# Patient Record
Sex: Female | Born: 1945 | Race: White | Hispanic: No | Marital: Married | Smoking: Never smoker
Health system: Southern US, Community
[De-identification: ages and names within clinical notes are randomized; demographics above are authoritative.]

## PROBLEM LIST (undated history)

## (undated) DIAGNOSIS — I509 Heart failure, unspecified: Secondary | ICD-10-CM

## (undated) DIAGNOSIS — K222 Esophageal obstruction: Secondary | ICD-10-CM

## (undated) DIAGNOSIS — I5042 Chronic combined systolic (congestive) and diastolic (congestive) heart failure: Secondary | ICD-10-CM

## (undated) DIAGNOSIS — E119 Type 2 diabetes mellitus without complications: Secondary | ICD-10-CM

## (undated) DIAGNOSIS — I10 Essential (primary) hypertension: Secondary | ICD-10-CM

## (undated) DIAGNOSIS — N289 Disorder of kidney and ureter, unspecified: Secondary | ICD-10-CM

## (undated) DIAGNOSIS — K449 Diaphragmatic hernia without obstruction or gangrene: Secondary | ICD-10-CM

## (undated) DIAGNOSIS — F0280 Dementia in other diseases classified elsewhere without behavioral disturbance: Secondary | ICD-10-CM

## (undated) DIAGNOSIS — S82899A Other fracture of unspecified lower leg, initial encounter for closed fracture: Secondary | ICD-10-CM

## (undated) DIAGNOSIS — E669 Obesity, unspecified: Secondary | ICD-10-CM

## (undated) DIAGNOSIS — D649 Anemia, unspecified: Secondary | ICD-10-CM

## (undated) DIAGNOSIS — N184 Chronic kidney disease, stage 4 (severe): Secondary | ICD-10-CM

## (undated) DIAGNOSIS — R195 Other fecal abnormalities: Secondary | ICD-10-CM

## (undated) DIAGNOSIS — I2510 Atherosclerotic heart disease of native coronary artery without angina pectoris: Secondary | ICD-10-CM

## (undated) HISTORY — DX: Type 2 diabetes mellitus without complications: E11.9

## (undated) HISTORY — PX: BILE DUCT STENT PLACEMENT: SHX1227

## (undated) HISTORY — DX: Other fracture of unspecified lower leg, initial encounter for closed fracture: S82.899A

## (undated) HISTORY — DX: Essential (primary) hypertension: I10

## (undated) HISTORY — PX: INCISIONAL HERNIA REPAIR: SHX193

## (undated) HISTORY — DX: Obesity, unspecified: E66.9

---

## 2006-03-08 DIAGNOSIS — S82899A Other fracture of unspecified lower leg, initial encounter for closed fracture: Secondary | ICD-10-CM

## 2006-03-08 HISTORY — DX: Other fracture of unspecified lower leg, initial encounter for closed fracture: S82.899A

## 2008-08-06 ENCOUNTER — Encounter: Admission: RE | Admit: 2008-08-06 | Discharge: 2008-08-06 | Payer: Self-pay | Admitting: Neurology

## 2013-11-07 ENCOUNTER — Encounter: Payer: Self-pay | Admitting: Vascular Surgery

## 2013-11-07 ENCOUNTER — Other Ambulatory Visit: Payer: Self-pay

## 2013-11-07 DIAGNOSIS — L98499 Non-pressure chronic ulcer of skin of other sites with unspecified severity: Secondary | ICD-10-CM

## 2013-11-07 DIAGNOSIS — L97909 Non-pressure chronic ulcer of unspecified part of unspecified lower leg with unspecified severity: Secondary | ICD-10-CM

## 2013-11-07 DIAGNOSIS — I739 Peripheral vascular disease, unspecified: Secondary | ICD-10-CM

## 2013-11-08 ENCOUNTER — Encounter: Payer: Self-pay | Admitting: Vascular Surgery

## 2013-11-08 ENCOUNTER — Ambulatory Visit (HOSPITAL_COMMUNITY)
Admission: RE | Admit: 2013-11-08 | Discharge: 2013-11-08 | Disposition: A | Discharge status: Home / Self Care | Payer: Medicare Other | Source: Ambulatory Visit | Attending: Vascular Surgery | Admitting: Vascular Surgery

## 2013-11-08 ENCOUNTER — Ambulatory Visit (INDEPENDENT_AMBULATORY_CARE_PROVIDER_SITE_OTHER): Payer: Medicare Other | Admitting: Vascular Surgery

## 2013-11-08 ENCOUNTER — Other Ambulatory Visit: Payer: Self-pay

## 2013-11-08 VITALS — Ht 62.0 in | Wt 189.5 lb

## 2013-11-08 DIAGNOSIS — L98499 Non-pressure chronic ulcer of skin of other sites with unspecified severity: Secondary | ICD-10-CM | POA: Insufficient documentation

## 2013-11-08 DIAGNOSIS — I739 Peripheral vascular disease, unspecified: Secondary | ICD-10-CM | POA: Insufficient documentation

## 2013-11-08 DIAGNOSIS — L97909 Non-pressure chronic ulcer of unspecified part of unspecified lower leg with unspecified severity: Secondary | ICD-10-CM

## 2013-11-08 DIAGNOSIS — I7025 Atherosclerosis of native arteries of other extremities with ulceration: Secondary | ICD-10-CM | POA: Insufficient documentation

## 2013-11-08 NOTE — Progress Notes (Signed)
Referred by:  Aleene Davidson, MD [address] [place], [place] [address]  Reason for referral: non-healing L lateral foot ulcer  History of Present Illness  Sabrina Wheeler is a 68 y.o. (June 16, 1945) female who presents with chief complaint: poorly healing spot on left foot.  This patient had a traumatic fracture of her L foot years ago and recently developed an ulceration of the skin over lying a lateral spot on her foot where previously an incision was present.  This spot has scabbed over 3 times but keeps requiring re-debridement by Dr. Nils Pyle.  Patient notes no fever or chills.  She notes no pain in her L foot.  She denies any history of intermittent claudication or rest pain.  Reportedly a recent bone biopsy through this wound was negative.  Atherosclerotic risk factors include: diabetes, HTN, HLD, and distant history of smoking.  Past Medical History  Diagnosis Date  . Diabetes mellitus without complication   . Obesity   . Hypertension   . Broken ankle 2008    left   HLD  Past Surgical History  Procedure Laterality Date  . Incisional hernia repair      History   Social History  . Marital Status: Married    Spouse Name: N/A    Number of Children: N/A  . Years of Education: N/A   Occupational History  . Not on file.   Social History Main Topics  . Smoking status: Former Research scientist (life sciences)  . Smokeless tobacco: Not on file  . Alcohol Use: No  . Drug Use: No  . Sexual Activity: Not on file   Other Topics Concern  . Not on file   Social History Narrative  . No narrative on file    Family History  Problem Relation Age of Onset  . Cancer Mother   . Heart disease Father   . Heart attack Father     Current Outpatient Prescriptions  Medication Sig Dispense Refill  . atenolol (TENORMIN) 25 MG tablet Take 25 mg by mouth daily.      . cloNIDine (CATAPRES) 0.1 MG tablet Take 0.1 mg by mouth 2 (two) times daily.      . enalapril (VASOTEC) 20 MG tablet Take 20 mg by mouth daily.       . fexofenadine (ALLEGRA) 180 MG tablet Take 180 mg by mouth daily.      . fluticasone (VERAMYST) 27.5 MCG/SPRAY nasal spray Place 1 spray into the nose daily.      Marland Kitchen gemfibrozil (LOPID) 600 MG tablet Take 600 mg by mouth 2 (two) times daily before a meal.      . meloxicam (MOBIC) 15 MG tablet Take 15 mg by mouth daily.      Marland Kitchen omeprazole (PRILOSEC) 20 MG capsule Take 20 mg by mouth 2 (two) times daily before a meal.      . pravastatin (PRAVACHOL) 20 MG tablet Take 20 mg by mouth daily.       No current facility-administered medications for this visit.    Allergies  Allergen Reactions  . Macrobid [employer] Macro] Other (See Comments)    Pt states "it paralyzed me"  . Doxycycline Nausea And Vomiting  . Clindamycin/Lincomycin Rash   REVIEW OF SYSTEMS:  (Positives checked otherwise negative)  CARDIOVASCULAR:  []  chest pain, []  chest pressure, []  palpitations, []  shortness of breath when laying flat, []  shortness of breath with exertion,  []  pain in feet when walking, []  pain in feet when laying flat, []   history of blood clot in veins (DVT), []  history of phlebitis, [x]  swelling in legs, []  varicose veins  PULMONARY:  []  productive cough, [x]  asthma, []  wheezing  NEUROLOGIC:  []  weakness in arms or legs, []  numbness in arms or legs, []  difficulty speaking or slurred speech, []  temporary loss of vision in one eye, []  dizziness  HEMATOLOGIC:  []  bleeding problems, []  problems with blood clotting too easily  MUSCULOSKEL:  []  joint pain, []  joint swelling  GASTROINTEST:  []  vomiting blood, []  blood in stool     GENITOURINARY:  []  burning with urination, []  blood in urine  PSYCHIATRIC:  []  history of major depression  INTEGUMENTARY:  []  rashes, []  ulcers  CONSTITUTIONAL:  []  fever, []  chills  For VQI Use Only  PRE-ADM LIVING: Home  AMB STATUS: Ambulatory  CAD Sx: None  PRIOR CHF: None  STRESS TEST: [x]  No, [ ]  Normal, [ ]  + ischemia, [ ]  + MI, [ ]   Both   Physical Examination  Filed Vitals:   11/08/13 1231  Height: 5\' 2"  (1.575 m)  Weight: 189 lb 8 oz (85.957 kg)   Body mass index is 34.65 kg/(m^2).  General: A&O x 3, WDWN  Head: [place]/AT  Ear/Nose/Throat: Hearing grossly intact, nares w/o erythema or drainage, oropharynx w/o Erythema/Exudate, Mallampati score: 3  Eyes: PERRLA, EOMI, Post surg chg to R lens,   Neck: Supple, no nuchal rigidity, no palpable LAD  Pulmonary: Sym exp, good air movt, CTAB, no rales, rhonchi, & wheezing  Cardiac: RRR, Nl S1, S2, no Murmurs, rubs or gallops  Vascular: Vessel Right Left  Radial Palpable Palpable  Brachial Palpable Palpable  Carotid Palpable, without bruit Palpable, without bruit  Aorta Not palpable N/A  Femoral Palpable Palpable  Popliteal Not palpable Not palpable  PT Not Palpable Not Palpable  DP Faintly Palpable Not Palpable   Gastrointestinal: soft, NTND, -G/R, - HSM, - masses, - CVAT B  Musculoskeletal: M/S 5/5 throughout , Extremities without ischemic changes except L foot: clean ulcer on lateral foot with granulation tissue (3 cm x 2 cm x 2 cm), B LDS, B spider veins  Neurologic: CN 2-12 intact , Pain and light touch intact in extremities except slightly decreased on plantar surface (R>L), Motor exam as listed above  Psychiatric: Judgment intact, Mood & affect appropriate for pt's clinical situation  Dermatologic: See M/S exam for extremity exam, no rashes otherwise noted  Lymph : No Cervical, Axillary, or Inguinal lymphadenopathy   Non-Invasive Vascular Imaging  Outside ABI (Date: 10/29/13)  R: 0.98, DP: mono, PT: bi, TBI: 0.49  L: 0.95, DP: mono, PT: mon, TBI: 0.32  LLE arterial duplex (11/08/2013)  Stenosis in L SFA and AK pop  Occluded PTA with flow in peroneal and AT  Outside Studies/Documentation 10 pages of outside documents were reviewed including: outpatient wound clinic chart and outside ABI with physiologics.  Medical Decision  Making  Sabrina Wheeler is a 68 y.o. female who presents with: LLE critical limb ischemia in form of non-healing L lateral foot wound   I discussed with the patient the natural history of critical limb ischemia: 25% require amputation in one year, 50% are able to maintain their limbs in one year, and 25-30% die in one year due to comorbidities.  Given the limb threatening status of this patient, I recommend an aggressive work up including proceeding with an: Aortogram, Bilateral runoff and intervention. I discussed with the patient the nature of angiographic procedures, especially the limited patencies of any  endovascular intervention. The patient is aware of that the risks of an angiographic procedure include but are not limited to: bleeding, infection, access site complications, embolization, rupture of treated vessel, dissection, possible need for emergent surgical intervention, and possible need for surgical procedures to treat the patient's pathology. The patient is aware of the risks and agrees to proceed.  The procedure is scheduled for: 10 SEP 15.  I discussed in depth with the patient the nature of atherosclerosis, and emphasized the importance of maximal medical management including strict control of blood pressure, blood glucose, and lipid levels, antiplatelet agents, obtaining regular exercise, and cessation of smoking.  The patient is aware that without maximal medical management the underlying atherosclerotic disease process will progress, limiting the benefit of any interventions. The patient is currently on a statin:  Pravachol. The patient is currently not on an anti-platelet. The patient will be started on ASA 81 mg PO daily.  Thank you for allowing [place] to participate in this patient's care.  Adele Barthel, MD [facility] Office: [phone] Pager: [phone]  11/08/2013, 1:36 PM

## 2013-11-09 ENCOUNTER — Encounter: Payer: Self-pay | Admitting: Surgery

## 2013-11-13 ENCOUNTER — Encounter (HOSPITAL_COMMUNITY): Payer: Self-pay

## 2013-11-14 MED ORDER — SODIUM CHLORIDE 0.9 % IV SOLN
INTRAVENOUS | Status: DC
  Administered 2013-11-15: 06:00:00 via INTRAVENOUS

## 2013-11-15 ENCOUNTER — Encounter (HOSPITAL_COMMUNITY)
Admission: RE | Disposition: A | Discharge status: Home / Self Care | Payer: Self-pay | Source: Ambulatory Visit | Attending: Vascular Surgery

## 2013-11-15 ENCOUNTER — Ambulatory Visit (HOSPITAL_COMMUNITY)
Admission: RE | Admit: 2013-11-15 | Discharge: 2013-11-15 | Disposition: A | Discharge status: Home / Self Care | Payer: Medicare Other | Source: Ambulatory Visit | Attending: Vascular Surgery | Admitting: Vascular Surgery

## 2013-11-15 DIAGNOSIS — I10 Essential (primary) hypertension: Secondary | ICD-10-CM | POA: Diagnosis not present

## 2013-11-15 DIAGNOSIS — E785 Hyperlipidemia, unspecified: Secondary | ICD-10-CM | POA: Diagnosis not present

## 2013-11-15 DIAGNOSIS — Z5309 Procedure and treatment not carried out because of other contraindication: Secondary | ICD-10-CM | POA: Diagnosis not present

## 2013-11-15 DIAGNOSIS — Z87891 Personal history of nicotine dependence: Secondary | ICD-10-CM | POA: Diagnosis not present

## 2013-11-15 DIAGNOSIS — L98499 Non-pressure chronic ulcer of skin of other sites with unspecified severity: Secondary | ICD-10-CM | POA: Insufficient documentation

## 2013-11-15 DIAGNOSIS — L97509 Non-pressure chronic ulcer of other part of unspecified foot with unspecified severity: Secondary | ICD-10-CM | POA: Insufficient documentation

## 2013-11-15 DIAGNOSIS — E875 Hyperkalemia: Secondary | ICD-10-CM | POA: Insufficient documentation

## 2013-11-15 DIAGNOSIS — E669 Obesity, unspecified: Secondary | ICD-10-CM | POA: Diagnosis not present

## 2013-11-15 DIAGNOSIS — I739 Peripheral vascular disease, unspecified: Secondary | ICD-10-CM | POA: Diagnosis not present

## 2013-11-15 DIAGNOSIS — E119 Type 2 diabetes mellitus without complications: Secondary | ICD-10-CM | POA: Insufficient documentation

## 2013-11-15 DIAGNOSIS — Z8781 Personal history of (healed) traumatic fracture: Secondary | ICD-10-CM | POA: Diagnosis not present

## 2013-11-15 LAB — POCT I-STAT, CHEM 8
BUN: 38 mg/dL — ABNORMAL HIGH (ref 6–23)
Calcium, Ion: 1.44 mmol/L — ABNORMAL HIGH (ref 1.13–1.30)
Chloride: 123 mEq/L — ABNORMAL HIGH (ref 96–112)
Creatinine, Ser: 2.2 mg/dL — ABNORMAL HIGH (ref 0.50–1.10)
GLUCOSE: 115 mg/dL — AB (ref 70–99)
HCT: 33 % — ABNORMAL LOW (ref 36.0–46.0)
HEMOGLOBIN: 11.2 g/dL — AB (ref 12.0–15.0)
POTASSIUM: 5.8 meq/L — AB (ref 3.7–5.3)
Sodium: 142 mEq/L (ref 137–147)
TCO2: 12 mmol/L (ref 0–100)

## 2013-11-15 LAB — COMPREHENSIVE METABOLIC PANEL
ALBUMIN: 3.5 g/dL (ref 3.5–5.2)
ALT: 12 U/L (ref 0–35)
ANION GAP: 14 (ref 5–15)
AST: 15 U/L (ref 0–37)
Alkaline Phosphatase: 93 U/L (ref 39–117)
BILIRUBIN TOTAL: 0.2 mg/dL — AB (ref 0.3–1.2)
BUN: 39 mg/dL — AB (ref 6–23)
CALCIUM: 9.3 mg/dL (ref 8.4–10.5)
CHLORIDE: 114 meq/L — AB (ref 96–112)
CO2: 12 mEq/L — ABNORMAL LOW (ref 19–32)
CREATININE: 1.99 mg/dL — AB (ref 0.50–1.10)
GFR calc Af Amer: 28 mL/min — ABNORMAL LOW (ref >90)
GFR calc non Af Amer: 24 mL/min — ABNORMAL LOW (ref >90)
Glucose, Bld: 115 mg/dL — ABNORMAL HIGH (ref 70–99)
Potassium: 6 mEq/L — ABNORMAL HIGH (ref 3.7–5.3)
Sodium: 140 mEq/L (ref 137–147)
Total Protein: 6.8 g/dL (ref 6.0–8.3)

## 2013-11-15 SURGERY — ABDOMINAL AORTAGRAM
Anesthesia: LOCAL

## 2013-11-15 NOTE — H&P (View-Only) (Signed)
Referred by:  Aleene Davidson, MD [address] [place], [place] [address]  Reason for referral: non-healing L lateral foot ulcer  History of Present Illness  Sabrina Wheeler is a 68 y.o. (01-13-1946) female who presents with chief complaint: poorly healing spot on left foot.  This patient had a traumatic fracture of her L foot years ago and recently developed an ulceration of the skin over lying a lateral spot on her foot where previously an incision was present.  This spot has scabbed over 3 times but keeps requiring re-debridement by Dr. Nils Pyle.  Patient notes no fever or chills.  She notes no pain in her L foot.  She denies any history of intermittent claudication or rest pain.  Reportedly a recent bone biopsy through this wound was negative.  Atherosclerotic risk factors include: diabetes, HTN, HLD, and distant history of smoking.  Past Medical History  Diagnosis Date  . Diabetes mellitus without complication   . Obesity   . Hypertension   . Broken ankle 2008    left   HLD  Past Surgical History  Procedure Laterality Date  . Incisional hernia repair      History   Social History  . Marital Status: Married    Spouse Name: N/A    Number of Children: N/A  . Years of Education: N/A   Occupational History  . Not on file.   Social History Main Topics  . Smoking status: Former Research scientist (life sciences)  . Smokeless tobacco: Not on file  . Alcohol Use: No  . Drug Use: No  . Sexual Activity: Not on file   Other Topics Concern  . Not on file   Social History Narrative  . No narrative on file    Family History  Problem Relation Age of Onset  . Cancer Mother   . Heart disease Father   . Heart attack Father     Current Outpatient Prescriptions  Medication Sig Dispense Refill  . atenolol (TENORMIN) 25 MG tablet Take 25 mg by mouth daily.      . cloNIDine (CATAPRES) 0.1 MG tablet Take 0.1 mg by mouth 2 (two) times daily.      . enalapril (VASOTEC) 20 MG tablet Take 20 mg by mouth daily.       . fexofenadine (ALLEGRA) 180 MG tablet Take 180 mg by mouth daily.      . fluticasone (VERAMYST) 27.5 MCG/SPRAY nasal spray Place 1 spray into the nose daily.      Marland Kitchen gemfibrozil (LOPID) 600 MG tablet Take 600 mg by mouth 2 (two) times daily before a meal.      . meloxicam (MOBIC) 15 MG tablet Take 15 mg by mouth daily.      Marland Kitchen omeprazole (PRILOSEC) 20 MG capsule Take 20 mg by mouth 2 (two) times daily before a meal.      . pravastatin (PRAVACHOL) 20 MG tablet Take 20 mg by mouth daily.       No current facility-administered medications for this visit.    Allergies  Allergen Reactions  . Macrobid [employer] Macro] Other (See Comments)    Pt states "it paralyzed me"  . Doxycycline Nausea And Vomiting  . Clindamycin/Lincomycin Rash   REVIEW OF SYSTEMS:  (Positives checked otherwise negative)  CARDIOVASCULAR:  []  chest pain, []  chest pressure, []  palpitations, []  shortness of breath when laying flat, []  shortness of breath with exertion,  []  pain in feet when walking, []  pain in feet when laying flat, []   history of blood clot in veins (DVT), []  history of phlebitis, [x]  swelling in legs, []  varicose veins  PULMONARY:  []  productive cough, [x]  asthma, []  wheezing  NEUROLOGIC:  []  weakness in arms or legs, []  numbness in arms or legs, []  difficulty speaking or slurred speech, []  temporary loss of vision in one eye, []  dizziness  HEMATOLOGIC:  []  bleeding problems, []  problems with blood clotting too easily  MUSCULOSKEL:  []  joint pain, []  joint swelling  GASTROINTEST:  []  vomiting blood, []  blood in stool     GENITOURINARY:  []  burning with urination, []  blood in urine  PSYCHIATRIC:  []  history of major depression  INTEGUMENTARY:  []  rashes, []  ulcers  CONSTITUTIONAL:  []  fever, []  chills  For VQI Use Only  PRE-ADM LIVING: Home  AMB STATUS: Ambulatory  CAD Sx: None  PRIOR CHF: None  STRESS TEST: [x]  No, [ ]  Normal, [ ]  + ischemia, [ ]  + MI, [ ]   Both   Physical Examination  Filed Vitals:   11/08/13 1231  Height: 5\' 2"  (1.575 m)  Weight: 189 lb 8 oz (85.957 kg)   Body mass index is 34.65 kg/(m^2).  General: A&O x 3, WDWN  Head: [place]/AT  Ear/Nose/Throat: Hearing grossly intact, nares w/o erythema or drainage, oropharynx w/o Erythema/Exudate, Mallampati score: 3  Eyes: PERRLA, EOMI, Post surg chg to R lens,   Neck: Supple, no nuchal rigidity, no palpable LAD  Pulmonary: Sym exp, good air movt, CTAB, no rales, rhonchi, & wheezing  Cardiac: RRR, Nl S1, S2, no Murmurs, rubs or gallops  Vascular: Vessel Right Left  Radial Palpable Palpable  Brachial Palpable Palpable  Carotid Palpable, without bruit Palpable, without bruit  Aorta Not palpable N/A  Femoral Palpable Palpable  Popliteal Not palpable Not palpable  PT Not Palpable Not Palpable  DP Faintly Palpable Not Palpable   Gastrointestinal: soft, NTND, -G/R, - HSM, - masses, - CVAT B  Musculoskeletal: M/S 5/5 throughout , Extremities without ischemic changes except L foot: clean ulcer on lateral foot with granulation tissue (3 cm x 2 cm x 2 cm), B LDS, B spider veins  Neurologic: CN 2-12 intact , Pain and light touch intact in extremities except slightly decreased on plantar surface (R>L), Motor exam as listed above  Psychiatric: Judgment intact, Mood & affect appropriate for pt's clinical situation  Dermatologic: See M/S exam for extremity exam, no rashes otherwise noted  Lymph : No Cervical, Axillary, or Inguinal lymphadenopathy   Non-Invasive Vascular Imaging  Outside ABI (Date: 10/29/13)  R: 0.98, DP: mono, PT: bi, TBI: 0.49  L: 0.95, DP: mono, PT: mon, TBI: 0.32  LLE arterial duplex (11/08/2013)  Stenosis in L SFA and AK pop  Occluded PTA with flow in peroneal and AT  Outside Studies/Documentation 10 pages of outside documents were reviewed including: outpatient wound clinic chart and outside ABI with physiologics.  Medical Decision  Making  Sabrina Wheeler is a 68 y.o. female who presents with: LLE critical limb ischemia in form of non-healing L lateral foot wound   I discussed with the patient the natural history of critical limb ischemia: 25% require amputation in one year, 50% are able to maintain their limbs in one year, and 25-30% die in one year due to comorbidities.  Given the limb threatening status of this patient, I recommend an aggressive work up including proceeding with an: Aortogram, Bilateral runoff and intervention. I discussed with the patient the nature of angiographic procedures, especially the limited patencies of any  endovascular intervention. The patient is aware of that the risks of an angiographic procedure include but are not limited to: bleeding, infection, access site complications, embolization, rupture of treated vessel, dissection, possible need for emergent surgical intervention, and possible need for surgical procedures to treat the patient's pathology. The patient is aware of the risks and agrees to proceed.  The procedure is scheduled for: 10 SEP 15.  I discussed in depth with the patient the nature of atherosclerosis, and emphasized the importance of maximal medical management including strict control of blood pressure, blood glucose, and lipid levels, antiplatelet agents, obtaining regular exercise, and cessation of smoking.  The patient is aware that without maximal medical management the underlying atherosclerotic disease process will progress, limiting the benefit of any interventions. The patient is currently on a statin:  Pravachol. The patient is currently not on an anti-platelet. The patient will be started on ASA 81 mg PO daily.  Thank you for allowing [place] to participate in this patient's care.  Adele Barthel, MD [facility] [place] Office: [phone] Pager: [phone]  11/08/2013, 1:36 PM

## 2013-11-15 NOTE — Interval H&P Note (Signed)
[facility] [place]  History and Physical Update  The patient was interviewed and re-examined.  The patient's previous History and Physical has been reviewed and is unchanged except for abnormal renal labs.   BMET    Component Value Date/Time   NA 142 11/15/2013 0637   K 5.8* 11/15/2013 0637   CL 123* 11/15/2013 0637   GLUCOSE 115* 11/15/2013 0637   BUN 38* 11/15/2013 0637   CREATININE 2.20* 11/15/2013 [id number]   - will order formal BMP before making a decision whether to cancel the case  Adele Barthel, MD [facility] [place] Office: [phone] Pager: [phone]  11/15/2013, 7:46 AM

## 2013-11-15 NOTE — Progress Notes (Signed)
Dr. Bridgett Larsson in; case cancelled due to elevated Creat. And Potassium; pt. To go directly to [facility] per order; IV right wrist intact, flushed per protocol.

## 2013-11-15 NOTE — Interval H&P Note (Signed)
[facility]  Addendum BMET    Component Value Date/Time   NA 140 11/15/2013 0739   K 6.0* 11/15/2013 0739   CL 114* 11/15/2013 0739   CO2 12* 11/15/2013 0739   GLUCOSE 115* 11/15/2013 0739   BUN 39* 11/15/2013 0739   CREATININE 1.99* 11/15/2013 0739   CALCIUM 9.3 11/15/2013 0739   GFRNONAA 24* 11/15/2013 0739   GFRAA 28* 11/15/2013 0739    - case will be canceled - pt will be given Kayexelate for the K - Will offer to arrange follow up appt tomorrow with her PCP for the hyperkalemia if she does not want to go to ER  Adele Barthel, MD [facility] [place]: [phone] Pager: [phone]  11/15/2013, 9:00 AM

## 2013-11-15 NOTE — Progress Notes (Signed)
   Daily Progress Note  Assessment/Planning: Hyperkalemia, Acute kidney disease vs CKD III   Case has been canceled due to poor kidney function and hyperkalemia  I recommended she go to ER for mgmt of the hyperkalemia and evaluation of her kidney  The patient and husband prefer to to go to [facility] ED rather than stay at [facility].  I have emphasized to the patient the importance on getting acute care due to risk of death due to her renal failure and hyperkalemia.  Subjective  - Day of Surgery  No complaints  Objective Filed Vitals:   11/15/13 0544  BP: 168/60  Pulse: 61  Temp: 97.7 F (36.5 C)  TempSrc: Oral  Resp: 20  Height: 5\' 2"  (1.575 m)  Weight: 187 lb (84.823 kg)  SpO2: 100%   No intake or output data in the 24 hours ending 11/15/13 0950  PULM  CTAB CV  RRR GI  soft, NTND L foot:  Ulcer on lateral foot unchanged  Laboratory CBC    Component Value Date/Time   HGB 11.2* 11/15/2013 0637   HCT 33.0* 11/15/2013 0637    BMET    Component Value Date/Time   NA 140 11/15/2013 0739   K 6.0* 11/15/2013 0739   CL 114* 11/15/2013 0739   CO2 12* 11/15/2013 0739   GLUCOSE 115* 11/15/2013 0739   BUN 39* 11/15/2013 0739   CREATININE 1.99* 11/15/2013 0739   CALCIUM 9.3 11/15/2013 0739   GFRNONAA 24* 11/15/2013 0739   GFRAA 28* 11/15/2013 [place], MD [facility] [place] Office: [phone] Pager: [phone]  11/15/2013, 9:50 AM

## 2014-03-14 ENCOUNTER — Telehealth: Payer: Self-pay

## 2014-03-14 NOTE — Telephone Encounter (Signed)
Phone call to pt.  Inquired about status of left lateral foot ulcer and need for scheduling the Aortogram that was recommended 11/08/13 per Dr. Bridgett Larsson.  Reported that she has been followed by [facility] Nils Pyle [facility], and that the ulcer on left foot has nearly healed-up.  Stated that Dr. Nils Pyle has been in touch with Dr. Bridgett Larsson.  Denied need to schedule f/u with Dr. Bridgett Larsson at this time.

## 2014-03-21 ENCOUNTER — Encounter (HOSPITAL_COMMUNITY): Payer: Self-pay | Admitting: Vascular Surgery

## 2015-02-04 ENCOUNTER — Encounter (HOSPITAL_COMMUNITY)
Admission: RE | Admit: 2015-02-04 | Discharge: 2015-02-04 | Disposition: A | Discharge status: Home / Self Care | Payer: Medicare Other | Source: Ambulatory Visit | Attending: Cardiovascular Disease | Admitting: Cardiovascular Disease

## 2015-02-04 VITALS — BP 124/62 | HR 66

## 2015-02-04 DIAGNOSIS — Z9861 Coronary angioplasty status: Secondary | ICD-10-CM | POA: Diagnosis not present

## 2015-02-04 DIAGNOSIS — I214 Non-ST elevation (NSTEMI) myocardial infarction: Secondary | ICD-10-CM | POA: Diagnosis present

## 2015-02-04 DIAGNOSIS — I2510 Atherosclerotic heart disease of native coronary artery without angina pectoris: Secondary | ICD-10-CM | POA: Insufficient documentation

## 2015-02-04 DIAGNOSIS — Z955 Presence of coronary angioplasty implant and graft: Secondary | ICD-10-CM

## 2015-02-04 NOTE — Progress Notes (Addendum)
Patient arrived for 1st visit/orientation/education at 0800. Patient was referred to CR by Dr. Hamilton Capri due to MI (I21.4) and PTCA (Z98.61) and Stent x1 (Z95.5). During orientation advised patient on arrival and appointment times what to wear, what to do before, during and after exercise. Reviewed attendance and class policy. Talked about inclement weather and class consultation policy. Pt is scheduled to return Cardiac Rehab on 02/10/15 at 1100.  Pt was advised to come to class 5 minutes before class starts. She was also given instructions on meeting with the dietician and attending the Family Structure classes. Patients entrance PHQ9 is 1. Pt is eager to get started. Patient was able to complete 6 minute Nustep test. Patient was measured for the equipment. Discussed equipment safety with patient. Took patient pre-anthropometric measurements. Patient finished visit at 0950.

## 2015-02-04 NOTE — Progress Notes (Signed)
Cardiac/Pulmonary Rehab Medication Review by a Pharmacist  Does the patient  feel that his/her medications are working for him/her?  yes  Has the patient been experiencing any side effects to the medications prescribed?  no  Does the patient measure his/her own blood pressure or blood glucose at home?  no   Does the patient have any problems obtaining medications due to transportation or finances?   no  Understanding of regimen: good Understanding of indications: good Potential of compliance: good  Mrs Zunker uses a pill box to help her remember to take her medications.  She feels her medications are working for her.  She has a daughter who works in [employer] who is able to help her obtain medications if needed.  She has close f/u with her PCP.   Excell Seltzer Poteet 02/04/2015 9:04 AM

## 2015-02-04 NOTE — Progress Notes (Signed)
6 MIN NUSTEP TEST  Test Date and Time: 02/04/15 08:28    REST  6-MIN  POST 2-MIN HR    61    66          60 BP            110/60           130/64      112/62 O2    97    95          98 RPE     7    11           7  RPD     7     9           7   Distance : 0.14 miles. 1.63mph Ex METs : 1.80  Patient completed NuStep test instead of walk test due to multiple conflicting health conditions. Patient completed entire 6 min. Patient did not complain of chest pain, SOB, or any other abnormal s/s.  Patient completed 0.14 miles = 1.86mph = 1.80 METs

## 2015-02-04 NOTE — Patient Instructions (Signed)
Pt has finished orientation and is scheduled to return to CR on 02/10/15 at 1100. Pt has been instructed to arrive to class 15 minutes early for scheduled class. Pt has been instructed to wear comfortable clothing and shoes with rubber soles. Pt has been told to take their medications 1 hour prior to coming to class.  If the patient is not going to attend class, she has been instructed to call.

## 2015-02-10 ENCOUNTER — Encounter (HOSPITAL_COMMUNITY)
Admission: RE | Admit: 2015-02-10 | Discharge: 2015-02-10 | Disposition: A | Discharge status: Home / Self Care | Payer: Medicare Other | Source: Ambulatory Visit | Attending: Cardiovascular Disease | Admitting: Cardiovascular Disease

## 2015-02-10 DIAGNOSIS — I214 Non-ST elevation (NSTEMI) myocardial infarction: Secondary | ICD-10-CM | POA: Diagnosis present

## 2015-02-10 DIAGNOSIS — Z9861 Coronary angioplasty status: Secondary | ICD-10-CM | POA: Insufficient documentation

## 2015-02-10 DIAGNOSIS — I2510 Atherosclerotic heart disease of native coronary artery without angina pectoris: Secondary | ICD-10-CM | POA: Diagnosis not present

## 2015-02-12 ENCOUNTER — Encounter (HOSPITAL_COMMUNITY)
Admission: RE | Admit: 2015-02-12 | Discharge: 2015-02-12 | Disposition: A | Discharge status: Home / Self Care | Payer: Medicare Other | Source: Ambulatory Visit | Attending: Cardiovascular Disease | Admitting: Cardiovascular Disease

## 2015-02-12 DIAGNOSIS — I214 Non-ST elevation (NSTEMI) myocardial infarction: Secondary | ICD-10-CM | POA: Diagnosis not present

## 2015-02-12 NOTE — Progress Notes (Signed)
Cardiac Rehabilitation Program Outcomes Report   Orientation:  02/04/15 Graduate Date:  tbd Discharge Date:  tbd # of sessions completed: 3  Cardiologist: Cleven[place] Family MD:  Wenda Overland Class Time:  1100  A.  Exercise Program:  Tolerates exercise @ 1.70 METS for 15 minutes and Walk Test Results:  Pre: Nustep Test 1.80 mets  B.  Mental Health:  Good mental attitude and PHQ-9: 1  C.  Education/Instruction/Skills  Accurately checks own pulse.  Rest:  65  Exercise:  78  Uses Perceived Exertion Scale and/or Dyspnea Scale  D.  Nutrition/Weight Control/Body Composition:  Adherence to prescribed nutrition program: fair    E.  Blood Lipids   No results found for: CHOL, HDL, LDLCALC, LDLDIRECT, TRIG, CHOLHDL  F.  Lifestyle Changes:  Making positive lifestyle changes and Not smoking:  Quit NEVER SMOKER  G.  Symptoms noted with exercise:  Asymptomatic  Report Completed By:   Stevphen Rochester RN   Comments:  This is the patients first week progress note for [facility].

## 2015-02-14 ENCOUNTER — Encounter (HOSPITAL_COMMUNITY)
Admission: RE | Admit: 2015-02-14 | Discharge: 2015-02-14 | Disposition: A | Discharge status: Home / Self Care | Payer: Medicare Other | Source: Ambulatory Visit | Attending: Cardiovascular Disease | Admitting: Cardiovascular Disease

## 2015-02-14 DIAGNOSIS — I214 Non-ST elevation (NSTEMI) myocardial infarction: Secondary | ICD-10-CM | POA: Diagnosis not present

## 2015-02-17 ENCOUNTER — Encounter (HOSPITAL_COMMUNITY)
Admission: RE | Admit: 2015-02-17 | Discharge: 2015-02-17 | Disposition: A | Discharge status: Home / Self Care | Payer: Medicare Other | Source: Ambulatory Visit | Attending: Cardiovascular Disease | Admitting: Cardiovascular Disease

## 2015-02-17 DIAGNOSIS — I214 Non-ST elevation (NSTEMI) myocardial infarction: Secondary | ICD-10-CM | POA: Diagnosis not present

## 2015-02-19 ENCOUNTER — Encounter (HOSPITAL_COMMUNITY)
Admission: RE | Admit: 2015-02-19 | Discharge: 2015-02-19 | Disposition: A | Discharge status: Home / Self Care | Payer: Medicare Other | Source: Ambulatory Visit | Attending: Cardiovascular Disease | Admitting: Cardiovascular Disease

## 2015-02-19 DIAGNOSIS — I214 Non-ST elevation (NSTEMI) myocardial infarction: Secondary | ICD-10-CM | POA: Diagnosis not present

## 2015-02-21 ENCOUNTER — Encounter (HOSPITAL_COMMUNITY)
Admission: RE | Admit: 2015-02-21 | Discharge: 2015-02-21 | Disposition: A | Discharge status: Home / Self Care | Payer: Medicare Other | Source: Ambulatory Visit | Attending: Cardiovascular Disease | Admitting: Cardiovascular Disease

## 2015-02-21 DIAGNOSIS — I214 Non-ST elevation (NSTEMI) myocardial infarction: Secondary | ICD-10-CM | POA: Diagnosis not present

## 2015-02-24 ENCOUNTER — Encounter (HOSPITAL_COMMUNITY)
Admission: RE | Admit: 2015-02-24 | Discharge: 2015-02-24 | Disposition: A | Discharge status: Home / Self Care | Payer: Medicare Other | Source: Ambulatory Visit | Attending: Cardiovascular Disease | Admitting: Cardiovascular Disease

## 2015-02-24 DIAGNOSIS — I214 Non-ST elevation (NSTEMI) myocardial infarction: Secondary | ICD-10-CM | POA: Diagnosis not present

## 2015-02-24 NOTE — Progress Notes (Signed)
Patient was given individual home exercise plan. Handout was reviewed and discussed. Patient verbalized an understanding. 

## 2015-02-26 ENCOUNTER — Encounter (HOSPITAL_COMMUNITY)
Admission: RE | Admit: 2015-02-26 | Discharge: 2015-02-26 | Disposition: A | Discharge status: Home / Self Care | Payer: Medicare Other | Source: Ambulatory Visit | Attending: Cardiovascular Disease | Admitting: Cardiovascular Disease

## 2015-02-26 DIAGNOSIS — I214 Non-ST elevation (NSTEMI) myocardial infarction: Secondary | ICD-10-CM | POA: Diagnosis not present

## 2015-02-28 ENCOUNTER — Encounter (HOSPITAL_COMMUNITY)
Admission: RE | Admit: 2015-02-28 | Discharge: 2015-02-28 | Disposition: A | Discharge status: Home / Self Care | Payer: Medicare Other | Source: Ambulatory Visit | Attending: Cardiovascular Disease | Admitting: Cardiovascular Disease

## 2015-02-28 DIAGNOSIS — I214 Non-ST elevation (NSTEMI) myocardial infarction: Secondary | ICD-10-CM | POA: Diagnosis not present

## 2015-03-03 ENCOUNTER — Encounter (HOSPITAL_COMMUNITY): Payer: Medicare Other

## 2015-03-05 ENCOUNTER — Encounter (HOSPITAL_COMMUNITY)
Admission: RE | Admit: 2015-03-05 | Discharge: 2015-03-05 | Disposition: A | Discharge status: Home / Self Care | Payer: Medicare Other | Source: Ambulatory Visit | Attending: Cardiovascular Disease | Admitting: Cardiovascular Disease

## 2015-03-05 DIAGNOSIS — I214 Non-ST elevation (NSTEMI) myocardial infarction: Secondary | ICD-10-CM | POA: Diagnosis not present

## 2015-03-07 ENCOUNTER — Encounter (HOSPITAL_COMMUNITY): Payer: Medicare Other

## 2015-03-07 NOTE — Progress Notes (Signed)
Patient weighed 164 lbs 02/24/15. Patient weighed 179 lbs 03/05/15.  Dr. Dion Body office called and message left. Patient advised to let us know if his office does not call back by the next day.  Patient stated she has noticed more swelling but denies any increased SOB. NAD.

## 2015-03-10 ENCOUNTER — Encounter (HOSPITAL_COMMUNITY): Payer: Medicare Other

## 2015-03-12 ENCOUNTER — Encounter (HOSPITAL_COMMUNITY)
Admission: RE | Admit: 2015-03-12 | Discharge: 2015-03-12 | Disposition: A | Discharge status: Home / Self Care | Payer: Medicare Other | Source: Ambulatory Visit | Attending: Cardiovascular Disease | Admitting: Cardiovascular Disease

## 2015-03-12 DIAGNOSIS — I2510 Atherosclerotic heart disease of native coronary artery without angina pectoris: Secondary | ICD-10-CM | POA: Diagnosis not present

## 2015-03-12 DIAGNOSIS — I214 Non-ST elevation (NSTEMI) myocardial infarction: Secondary | ICD-10-CM | POA: Diagnosis present

## 2015-03-12 DIAGNOSIS — Z9861 Coronary angioplasty status: Secondary | ICD-10-CM | POA: Insufficient documentation

## 2015-03-14 ENCOUNTER — Encounter (HOSPITAL_COMMUNITY)
Admission: RE | Admit: 2015-03-14 | Discharge: 2015-03-14 | Disposition: A | Discharge status: Home / Self Care | Payer: Medicare Other | Source: Ambulatory Visit | Attending: Cardiovascular Disease | Admitting: Cardiovascular Disease

## 2015-03-14 DIAGNOSIS — I214 Non-ST elevation (NSTEMI) myocardial infarction: Secondary | ICD-10-CM | POA: Diagnosis not present

## 2015-03-17 ENCOUNTER — Encounter (HOSPITAL_COMMUNITY): Payer: Medicare Other

## 2015-03-19 ENCOUNTER — Encounter (HOSPITAL_COMMUNITY)
Admission: RE | Admit: 2015-03-19 | Discharge: 2015-03-19 | Disposition: A | Discharge status: Home / Self Care | Payer: Medicare Other | Source: Ambulatory Visit | Attending: Cardiovascular Disease | Admitting: Cardiovascular Disease

## 2015-03-19 DIAGNOSIS — I214 Non-ST elevation (NSTEMI) myocardial infarction: Secondary | ICD-10-CM | POA: Diagnosis not present

## 2015-03-21 ENCOUNTER — Encounter (HOSPITAL_COMMUNITY): Payer: Medicare Other

## 2015-03-24 ENCOUNTER — Encounter (HOSPITAL_COMMUNITY): Payer: Medicare Other

## 2015-03-26 ENCOUNTER — Encounter (HOSPITAL_COMMUNITY): Payer: Medicare Other

## 2015-03-28 ENCOUNTER — Encounter (HOSPITAL_COMMUNITY): Payer: Medicare Other

## 2015-03-31 ENCOUNTER — Encounter (HOSPITAL_COMMUNITY): Payer: Medicare Other

## 2015-04-01 NOTE — Progress Notes (Signed)
Patient came into office today stating she is quitting [facility].  Patient stated she is going to exercise at home. Patient stated she really enjoyed the program.

## 2015-04-02 ENCOUNTER — Encounter (HOSPITAL_COMMUNITY): Payer: Medicare Other

## 2015-04-04 ENCOUNTER — Encounter (HOSPITAL_COMMUNITY): Payer: Medicare Other

## 2015-04-07 ENCOUNTER — Encounter (HOSPITAL_COMMUNITY): Payer: Medicare Other

## 2015-04-09 ENCOUNTER — Encounter (HOSPITAL_COMMUNITY): Payer: Medicare Other

## 2015-04-11 ENCOUNTER — Encounter (HOSPITAL_COMMUNITY): Payer: Medicare Other

## 2015-04-14 ENCOUNTER — Encounter (HOSPITAL_COMMUNITY): Payer: Medicare Other

## 2015-04-16 ENCOUNTER — Encounter (HOSPITAL_COMMUNITY): Payer: Medicare Other

## 2015-04-18 ENCOUNTER — Encounter (HOSPITAL_COMMUNITY): Payer: Medicare Other

## 2015-04-21 ENCOUNTER — Encounter (HOSPITAL_COMMUNITY): Payer: Medicare Other

## 2015-04-23 ENCOUNTER — Encounter (HOSPITAL_COMMUNITY): Payer: Medicare Other

## 2015-04-25 ENCOUNTER — Encounter (HOSPITAL_COMMUNITY): Payer: Medicare Other

## 2015-04-28 ENCOUNTER — Encounter (HOSPITAL_COMMUNITY): Payer: Medicare Other

## 2015-04-30 ENCOUNTER — Encounter (HOSPITAL_COMMUNITY): Payer: Medicare Other

## 2015-05-02 ENCOUNTER — Encounter (HOSPITAL_COMMUNITY): Payer: Medicare Other

## 2015-12-29 ENCOUNTER — Encounter: Payer: Self-pay | Admitting: *Deleted

## 2015-12-30 ENCOUNTER — Encounter: Payer: Self-pay | Admitting: Cardiology

## 2015-12-30 ENCOUNTER — Ambulatory Visit (INDEPENDENT_AMBULATORY_CARE_PROVIDER_SITE_OTHER): Payer: Medicare Other | Admitting: Cardiology

## 2015-12-30 VITALS — BP 169/80 | HR 60 | Ht 60.0 in | Wt 160.6 lb

## 2015-12-30 DIAGNOSIS — I2510 Atherosclerotic heart disease of native coronary artery without angina pectoris: Secondary | ICD-10-CM | POA: Diagnosis not present

## 2015-12-30 DIAGNOSIS — E782 Mixed hyperlipidemia: Secondary | ICD-10-CM | POA: Diagnosis not present

## 2015-12-30 DIAGNOSIS — I10 Essential (primary) hypertension: Secondary | ICD-10-CM | POA: Diagnosis not present

## 2015-12-30 DIAGNOSIS — I5022 Chronic systolic (congestive) heart failure: Secondary | ICD-10-CM

## 2015-12-30 NOTE — Progress Notes (Signed)
Clinical Summary Ms. Langner is a 70 y.o.female seen as new patient, former patient of Dr Hamilton Capri at [facility] cardiology. She is referred by Dr Wenda Overland.  1. CAD - history of NSTEMI, received DES to LAD 01/06/15 at [place] - 12/2014 echo LVEF 40-45%, apical ballooning, grade I diastolic dysfunction - no recent chest pain. No SOB or DOE - taking meds daily.  - can have some occasional LE edema, though she relates to a previous ankle fracture.     2. Hyperlipidemia - compliant with statin. She has been on niacin and gemfibrozil as well according to records.   3. CKD - followed by pcp  4. HTN - she reports history of white coat HTN - compliant with meds  Past Medical History:  Diagnosis Date  . Broken ankle 2008   left   . Diabetes mellitus without complication ([place])   . Hypertension   . Obesity      Allergies  Allergen Reactions  . Macrobid [employer] Macro] Other (See Comments)    Pt states "it paralyzed me"  . Doxycycline Nausea And Vomiting  . Clindamycin/Lincomycin Rash     Current Outpatient Prescriptions  Medication Sig Dispense Refill  . aspirin EC 81 MG tablet Take 81 mg by mouth daily.    Marland Kitchen atenolol (TENORMIN) 25 MG tablet Take 25 mg by mouth daily.    . Calcium Carb-Cholecalciferol (CALCIUM 600+D) 600-800 MG-UNIT TABS Take 1 tablet by mouth at bedtime.    . carvedilol (COREG) 6.25 MG tablet Take 6.25 mg by mouth 2 (two) times daily with a meal.    . Cholecalciferol 1000 UNITS capsule Take 1,000 Units by mouth at bedtime.    . Chromium-Cinnamon [phone] MCG-MG CAPS Take 1 capsule by mouth daily.    . cloNIDine (CATAPRES) 0.1 MG tablet Take 0.1 mg by mouth 2 (two) times daily.    . Cranberry-Vitamin C-Vitamin E (CRANBERRY PLUS VITAMIN C) 4200-20-3 MG-MG-UNIT CAPS Take 1 capsule by mouth daily.    . enalapril (VASOTEC) 20 MG tablet Take 20 mg by mouth 2 (two) times daily.     . fexofenadine (ALLEGRA) 180 MG tablet Take 90 mg by mouth daily.      . Flaxseed, Linseed, (FLAXSEED OIL MAX STR) 1300 MG CAPS Take 1,300 mg by mouth daily.    . fluticasone (FLONASE) 50 MCG/ACT nasal spray Place 1 spray into both nostrils at bedtime.    . gabapentin (NEURONTIN) 100 MG capsule Take 200 mg by mouth 2 (two) times daily.    . Garlic 599 MG TABS Take 500 mg by mouth daily.    Marland Kitchen gemfibrozil (LOPID) 600 MG tablet Take 600 mg by mouth 2 (two) times daily before a meal.    . Glucosamine-Chondroitin (OSTEO BI-FLEX REGULAR STRENGTH PO) Take 1 capsule by mouth 2 (two) times daily.    Marland Kitchen LECITHIN PO Take 1 capsule by mouth daily. 1325mg     . lisinopril (PRINIVIL,ZESTRIL) 5 MG tablet Take 5 mg by mouth daily.    . meloxicam (MOBIC) 15 MG tablet Take 15 mg by mouth daily.    . Multiple Vitamins-Minerals (MULTIVITAMIN WITH MINERALS) tablet Take 1 tablet by mouth daily.    . niacin 500 MG tablet Take 500 mg by mouth at bedtime.    Marland Kitchen omeprazole (PRILOSEC) 20 MG capsule Take 20 mg by mouth 2 (two) times daily before a meal.    . pantoprazole (PROTONIX) 40 MG tablet Take 40 mg by mouth daily.    Marland Kitchen  prasugrel (EFFIENT) 10 MG TABS tablet Take 10 mg by mouth daily.    . pravastatin (PRAVACHOL) 20 MG tablet Take 20 mg by mouth at bedtime.     . Red Yeast Rice 600 MG TABS Take 600 mg by mouth 2 (two) times daily.    . sodium bicarbonate 650 MG tablet Take 650 mg by mouth 3 (three) times daily.    Marland Kitchen spironolactone (ALDACTONE) 25 MG tablet Take 25 mg by mouth 2 (two) times daily.     No current facility-administered medications for this visit.      Past Surgical History:  Procedure Laterality Date  . INCISIONAL HERNIA REPAIR       Allergies  Allergen Reactions  . Macrobid [employer] Macro] Other (See Comments)    Pt states "it paralyzed me"  . Doxycycline Nausea And Vomiting  . Clindamycin/Lincomycin Rash      Family History  Problem Relation Age of Onset  . Cancer Mother   . Heart disease Father   . Heart attack Father      Social  History Ms. Birchard reports that she has quit smoking. She does not have any smokeless tobacco history on file. Ms. Salih reports that she does not drink alcohol.   Review of Systems CONSTITUTIONAL: No weight loss, fever, chills, weakness or fatigue.  HEENT: Eyes: No visual loss, blurred vision, double vision or yellow sclerae.No hearing loss, sneezing, congestion, runny nose or sore throat.  SKIN: No rash or itching.  CARDIOVASCULAR: per HPI RESPIRATORY: No shortness of breath, cough or sputum.  GASTROINTESTINAL: No anorexia, nausea, vomiting or diarrhea. No abdominal pain or blood.  GENITOURINARY: No burning on urination, no polyuria NEUROLOGICAL: No headache, dizziness, syncope, paralysis, ataxia, numbness or tingling in the extremities. No change in bowel or bladder control.  MUSCULOSKELETAL: No muscle, back pain, joint pain or stiffness.  LYMPHATICS: No enlarged nodes. No history of splenectomy.  PSYCHIATRIC: No history of depression or anxiety.  ENDOCRINOLOGIC: No reports of sweating, cold or heat intolerance. No polyuria or polydipsia.  Marland Kitchen   Physical Examination Vitals:   12/30/15 1013  BP: (!) 169/80  Pulse: 60   Vitals:   12/30/15 1013  Weight: 160 lb 9.6 oz (72.8 kg)  Height: 5' (1.524 m)    Gen: resting comfortably, no acute distress HEENT: no scleral icterus, pupils equal round and reactive, no palptable cervical adenopathy,  CV: RRR, no m/r/g, no jvd Resp: Clear to auscultation bilaterally GI: abdomen is soft, non-tender, non-distended, normal bowel sounds, no hepatosplenomegaly MSK: extremities are warm, no edema.  Skin: warm, no rash Neuro:  no focal deficits Psych: appropriate affect   Diagnostic Studies 12/2014 cath at [place]: Coronary Angiography 1. Left Main - Normal 2. Left anterior descending artery - heavily calcified 95% proximal, 25% distal 3. Diagonals - 25% ostial 4. Left Circumflex - 25% proximal 5. Obtuse Marginals - 25% 6.  Right Coronary Artery - 25% proximal, 50% mid 7. Posterior Descending Artery - Normal Additional comments on angiography: Right Dominance  Hemodynamics 1. Aortic Pressure - 138/58 mmHg 2. Left Ventricular - 138/20 mmHg Additional comments on on hemodynamics: None.   Left Ventriculography:Not Performed.   Percutaneous coronary intervention: An EBU 3.5 guide catheter gave excellent support. The 95% proximal LAD lesion was heavily calcified and easily crossed with a BMW wire. We predilated with a 2.0 x 15 mm trek balloon. We implanted a 2.25 x 20 mm Synergy  drug-eluting stent. This was chosen because of vessel tortuosity heavy calcification.  Subsequent angiography demonstrated an excellent result with lesion reduction from 95% to 0% with no complicating features. This was performed using aspirin, Effient,  and heparin.  CONCLUSIONS:  1. Successful transradial cardiac catheterization 2. Obstructive one-vessel coronary artery disease 3. Status post drug-eluting stent implantation to the LAD with lesion reduction from 95% to 0% 4 left ventricular diastolic pressure 20  RECOMMENDATIONS: The patient will need to remain on Aspirin and Effient for minimum of one year.  12/2015 Echo Interpretation Summary A complete two-dimensional transthoracic echocardiogram was performed. The left ventricle is grossly normal size. There is normal left ventricular wall thickness. The left ventricular ejection fraction is moderately reduced (40-45%). Apical Ballooning Grade I mild diastolic dysfunction; abnormal relaxation pattern. There is mild (1+) mitral regurgitation. Increased Left ventricle filling pressure Mild aortic sclerosis is present with good valvular opening. There is mild to moderate (1-2+) tricuspid regurgitation. The left atrium is mildly dilated.  Assessment and Plan  1. CAD - no recent symptoms - continue DAPT until 12/2015 - repeat echo, she had mild LV systolic dysfunction at  time of her MI. If persists will need to titrate up her CHF regimen.   2. Hyperlipidemia - in setting of known CAD I have recommended changing pravastatin to 80mg  daily, and discontinuing niacin and gemfibrozil due to lack of evidence of benefit toward clinical outcomes. She is hesitant to make changes at this time, we will readdress at her next visit  3. CKD IV - followed by pcp  4. HTN - elevated in clinic, she reports history of white coat HTN - she will submit bp log in 1 week. Due to her CKD goal bp <130/80  F/u 4 months  Arnoldo Lenis, M.D

## 2015-12-30 NOTE — Patient Instructions (Signed)
Your physician recommends that you schedule a follow-up appointment in: [place] DR. [place]   Your physician has recommended you make the following change in your medication:   STOP EFFIENT ON 01/06/16  Your physician has requested that you have an echocardiogram. Echocardiography is a painless test that uses sound waves to create images of your heart. It provides your doctor with information about the size and shape of your heart and how well your heart's chambers and valves are working. This procedure takes approximately one hour. There are no restrictions for this procedure.   Your physician has requested that you regularly monitor and record your blood pressure readings at home FOR 1 [place]. Please use the same machine at the same time of day to check your readings and record them to bring to your follow-up visit.  Thank you for choosing [facility]!!

## 2016-01-14 ENCOUNTER — Ambulatory Visit (INDEPENDENT_AMBULATORY_CARE_PROVIDER_SITE_OTHER): Payer: Medicare Other

## 2016-01-14 ENCOUNTER — Other Ambulatory Visit: Payer: Self-pay

## 2016-01-14 DIAGNOSIS — I5022 Chronic systolic (congestive) heart failure: Secondary | ICD-10-CM

## 2016-01-14 LAB — ECHOCARDIOGRAM COMPLETE
CHL CUP DOP CALC LVOT VTI: 25.1 cm
CHL CUP RV SYS PRESS: 16 mmHg
E decel time: 269 msec
EERAT: 15.74
FS: 40 % (ref 28–44)
IVS/LV PW RATIO, ED: 0.74
LA diam index: 2.3 cm/m2
LA vol A4C: 40.4 ml
LA vol index: 26.3 mL/m2
LASIZE: 41 mm
LAVOL: 47 mL
LEFT ATRIUM END SYS DIAM: 41 mm
LV PW d: 11.2 mm — AB (ref 0.6–1.1)
LV SIMPSON'S DISK: 61
LV dias vol index: 44 mL/m2
LV dias vol: 78 mL (ref 46–106)
LV e' LATERAL: 6.8 cm/s
LV sys vol: 31 mL (ref 14–42)
LVEEAVG: 15.74
LVEEMED: 15.74
LVOT area: 2.54 cm2
LVOT diameter: 18 mm
LVOT peak vel: 98.3 cm/s
LVOTSV: 64 mL
LVSYSVOLIN: 17 mL/m2
MV Dec: 269
MV Peak grad: 5 mmHg
MV pk A vel: 116 m/s
MVPKEVEL: 107 m/s
PV Reg vel dias: 95 cm/s
Reg peak vel: 180 cm/s
Stroke v: 47 ml
TAPSE: 27.2 mm
TDI e' lateral: 6.8
TDI e' medial: 5.07
TR max vel: 180 cm/s

## 2016-01-15 ENCOUNTER — Telehealth: Payer: Self-pay | Admitting: *Deleted

## 2016-01-15 NOTE — Telephone Encounter (Signed)
Pt aware - routed to pcp  

## 2016-01-15 NOTE — Telephone Encounter (Signed)
-----   Message from Arnoldo Lenis, MD sent at 01/15/2016  2:46 PM EST ----- Echo looks good, normal heart function.  Zandra Abts MD

## 2016-03-22 ENCOUNTER — Telehealth: Payer: Self-pay | Admitting: *Deleted

## 2016-03-22 MED ORDER — ATORVASTATIN CALCIUM 80 MG PO TABS: 80.0000 mg | ORAL_TABLET | Freq: Every day | ORAL | 3 refills | Status: DC

## 2016-03-22 NOTE — Telephone Encounter (Signed)
Pt says at [place] was recommended d/c pravastatin and start Lipitor 80 mg - pt says she is ready to make this change - will route to Dr. Harl Bowie to confirm

## 2016-03-22 NOTE — Telephone Encounter (Signed)
Pt confirmed she stopped gemfibrozil and niacin - will stop pravastatin - Lipitor 80 mg daily sent to [facility] as requested - updated medication list

## 2016-03-22 NOTE — Telephone Encounter (Signed)
Yes, lipitor 80mg  is the recommended dose for patients who have had prior blockages before. Please change her. Did she stop her niacin and gemfibrozil? If not gemfibrozil can interact with lipitor. My recommendation is to be off niacin, gemfibrozil and change pravastatin to lipitor 80mg  daily    Zandra Abts MD

## 2016-05-03 ENCOUNTER — Encounter: Payer: Self-pay | Admitting: Cardiology

## 2016-05-03 ENCOUNTER — Ambulatory Visit (INDEPENDENT_AMBULATORY_CARE_PROVIDER_SITE_OTHER): Payer: Medicare Other | Admitting: Cardiology

## 2016-05-03 ENCOUNTER — Encounter: Payer: Self-pay | Admitting: *Deleted

## 2016-05-03 VITALS — BP 169/72 | HR 66 | Ht 60.0 in | Wt 171.0 lb

## 2016-05-03 DIAGNOSIS — E782 Mixed hyperlipidemia: Secondary | ICD-10-CM | POA: Diagnosis not present

## 2016-05-03 DIAGNOSIS — I10 Essential (primary) hypertension: Secondary | ICD-10-CM

## 2016-05-03 DIAGNOSIS — I2510 Atherosclerotic heart disease of native coronary artery without angina pectoris: Secondary | ICD-10-CM | POA: Diagnosis not present

## 2016-05-03 NOTE — Progress Notes (Signed)
Clinical Summary Sabrina Wheeler is a 71 y.o.female seen today for follow up of the following medical problems.   1. CAD - history of NSTEMI, received DES to LAD 01/06/15 at [place] - 12/2014 echo LVEF 40-45%, apical ballooning, grade I diastolic dysfunction - 35/3614 echo LVEF 55-60%, no WMAs, grade I diastolic dysfunction.    - no recent chest pain. No SOB or DOE - compliant with meds.   2. Hyperlipidemia - last visit we changed her to lipitor 80mg  daily in setting of her CAD - has upcoming labs with pcp  3. CKD - followed by pcp and nephrology  4. HTN - she reports history of white coat HTN - compliant with meds. Does not check her bp regularly at home.    Past Medical History:  Diagnosis Date  . Broken ankle 2008   left   . Diabetes mellitus without complication ([place])   . Hypertension   . Obesity      Allergies  Allergen Reactions  . Macrobid [employer] Macro] Other (See Comments)    Pt states "it paralyzed me"  . Doxycycline Nausea And Vomiting  . Clindamycin/Lincomycin Rash     Current Outpatient Prescriptions  Medication Sig Dispense Refill  . aspirin EC 81 MG tablet Take 81 mg by mouth daily.    Marland Kitchen atorvastatin (LIPITOR) 80 MG tablet Take 1 tablet (80 mg total) by mouth daily. 90 tablet 3  . Calcium Carb-Cholecalciferol (CALCIUM 600+D) 600-800 MG-UNIT TABS Take 1 tablet by mouth at bedtime.    . carvedilol (COREG) 6.25 MG tablet Take 6.25 mg by mouth 2 (two) times daily with a meal.    . Cholecalciferol 1000 UNITS capsule Take 1,000 Units by mouth at bedtime.    . Chromium-Cinnamon [phone] MCG-MG CAPS Take 1 capsule by mouth daily.    . Cranberry-Vitamin C-Vitamin E (CRANBERRY PLUS VITAMIN C) 4200-20-3 MG-MG-UNIT CAPS Take 1 capsule by mouth daily.    . ferrous gluconate (FERGON) 324 MG tablet Take 324 mg by mouth 2 (two) times daily with a meal.    . fexofenadine (ALLEGRA) 180 MG tablet Take 90 mg by mouth daily.     . fluticasone  (FLONASE) 50 MCG/ACT nasal spray Place 1 spray into both nostrils at bedtime.    . folic acid (FOLVITE) 431 MCG tablet Take 400 mcg by mouth daily.    . furosemide (LASIX) 20 MG tablet Take 20 mg by mouth daily.    Marland Kitchen gabapentin (NEURONTIN) 100 MG capsule Take 200 mg by mouth 3 (three) times daily.     . Garlic 540 MG TABS Take 500 mg by mouth daily.    . Glucosamine-Chondroitin (OSTEO BI-FLEX REGULAR STRENGTH PO) Take 1 capsule by mouth 2 (two) times daily.    . L-ARGININE-500 PO Take by mouth 2 (two) times daily.    Marland Kitchen LECITHIN PO Take 1 capsule by mouth daily. 1325mg     . lisinopril (PRINIVIL,ZESTRIL) 5 MG tablet Take 5 mg by mouth daily.    . Multiple Vitamins-Minerals (MULTIVITAMIN WITH MINERALS) tablet Take 1 tablet by mouth daily.    . pantoprazole (PROTONIX) 40 MG tablet Take 40 mg by mouth daily.    . Red Yeast Rice 600 MG TABS Take 600 mg by mouth 2 (two) times daily.    . SODIUM BICARBONATE PO Take by mouth. 10 gr three times daily    . TURMERIC PO Take by mouth daily.     No current facility-administered medications for this visit.  Past Surgical History:  Procedure Laterality Date  . INCISIONAL HERNIA REPAIR       Allergies  Allergen Reactions  . Macrobid [employer] Macro] Other (See Comments)    Pt states "it paralyzed me"  . Doxycycline Nausea And Vomiting  . Clindamycin/Lincomycin Rash      Family History  Problem Relation Age of Onset  . Cancer Mother   . Heart disease Father   . Heart attack Father      Social History Sabrina Wheeler reports that she has never smoked. She has never used smokeless tobacco. Sabrina Wheeler reports that she does not drink alcohol.   Review of Systems CONSTITUTIONAL: No weight loss, fever, chills, weakness or fatigue.  HEENT: Eyes: No visual loss, blurred vision, double vision or yellow sclerae.No hearing loss, sneezing, congestion, runny nose or sore throat.  SKIN: No rash or itching.  CARDIOVASCULAR: per  HPI RESPIRATORY: No shortness of breath, cough or sputum.  GASTROINTESTINAL: No anorexia, nausea, vomiting or diarrhea. No abdominal pain or blood.  GENITOURINARY: No burning on urination, no polyuria NEUROLOGICAL: No headache, dizziness, syncope, paralysis, ataxia, numbness or tingling in the extremities. No change in bowel or bladder control.  MUSCULOSKELETAL: No muscle, back pain, joint pain or stiffness.  LYMPHATICS: No enlarged nodes. No history of splenectomy.  PSYCHIATRIC: No history of depression or anxiety.  ENDOCRINOLOGIC: No reports of sweating, cold or heat intolerance. No polyuria or polydipsia.  Marland Kitchen   Physical Examination Vitals:   05/03/16 0951  BP: (!) 169/72  Pulse: 66   Vitals:   05/03/16 0951  Weight: 171 lb (77.6 kg)  Height: 5' (1.524 m)    Gen: resting comfortably, no acute distress HEENT: no scleral icterus, pupils equal round and reactive, no palptable cervical adenopathy,  CV: RRR, no m/r/g, no jvd Resp: Clear to auscultation bilaterally GI: abdomen is soft, non-tender, non-distended, normal bowel sounds, no hepatosplenomegaly MSK: extremities are warm, no edema.  Skin: warm, no rash Neuro:  no focal deficits Psych: appropriate affect   Diagnostic Studies 12/2014 cath at [place]: Coronary Angiography 1. Left Main - Normal 2. Left anterior descending artery - heavily calcified 95% proximal, 25% distal 3. Diagonals - 25% ostial 4. Left Circumflex - 25% proximal 5. Obtuse Marginals - 25% 6. Right Coronary Artery - 25% proximal, 50% mid 7. Posterior Descending Artery - Normal Additional comments on angiography: Right Dominance  Hemodynamics 1. Aortic Pressure - 138/58 mmHg 2. Left Ventricular - 138/20 mmHg Additional comments on on hemodynamics: None.   Left Ventriculography:Not Performed.   Percutaneous coronary intervention: An EBU 3.5 guide catheter gave excellent support. The 95% proximal LAD lesion was heavily calcified and  easily crossed with a BMW wire. We predilated with a 2.0 x 15 mm trek balloon. We implanted a 2.25 x 20 mm Synergy  drug-eluting stent. This was chosen because of vessel tortuosity heavy calcification. Subsequent angiography demonstrated an excellent result with lesion reduction from 95% to 0% with no complicating features. This was performed using aspirin, Effient,  and heparin.  CONCLUSIONS:  1. Successful transradial cardiac catheterization 2. Obstructive one-vessel coronary artery disease 3. Status post drug-eluting stent implantation to the LAD with lesion reduction from 95% to 0% 4 left ventricular diastolic pressure 20  RECOMMENDATIONS: The patient will need to remain on Aspirin and Effient for minimum of one year.  12/2015 Echo Interpretation Summary A complete two-dimensional transthoracic echocardiogram was performed. The left ventricle is grossly normal size. There is normal left ventricular wall thickness. The  left ventricular ejection fraction is moderately reduced (40-45%). Apical Ballooning Grade I mild diastolic dysfunction; abnormal relaxation pattern. There is mild (1+) mitral regurgitation. Increased Left ventricle filling pressure Mild aortic sclerosis is present with good valvular opening. There is mild to moderate (1-2+) tricuspid regurgitation. The left atrium is mildly dilated.  01/2016 echo Study Conclusions  - Left ventricle: The cavity size was normal. Wall thickness was   increased in a pattern of mild LVH. Systolic function was normal.   The estimated ejection fraction was in the range of 55% to 60%.   Wall motion was normal; there were no regional wall motion   abnormalities. Doppler parameters are consistent with abnormal   left ventricular relaxation (grade 1 diastolic dysfunction). - Aortic valve: Mildly calcified annulus. Trileaflet. - Mitral valve: Calcified annulus. There was trivial regurgitation. - Right atrium: Central venous pressure  (est): 3 mm Hg. - Atrial septum: No defect or patent foramen ovale was identified. - Tricuspid valve: There was trivial regurgitation. - Pulmonary arteries: PA peak pressure: 16 mm Hg (S). - Pericardium, extracardiac: There was no pericardial effusion.  Impressions:  - Mild LVH with LVEF 55-60%. Grade 1 diastolic dysfunction. Mildly   calcified mitral annulus with trivial mitral regurgitation.   Mildly calcified aortic annulus. Trivial tricuspid regurgitation   with normal estimated PASP. No pericardial effusion.  Assessment and Plan   1. CAD - no recent symptoms. EKG shows SR, no acute ischemic changes - repeat echo shows LVEF has normalized.  - continue current meds  2. Hyperlipidemia - continue statin, f/u pcp labs  3. CKD IV - followed by pcp  4. HTN - elevated in clinic, she reports history of white coat HTN - we have asked her to call our office Friday with her home bp's to verify this truly is white coat HTN.   F/u 6 months     Arnoldo Lenis, M.D.

## 2016-05-03 NOTE — Patient Instructions (Signed)
Your physician wants you to follow-up in: [place] DR. BRANCH  You will receive a reminder letter in the mail two months in advance. If you don't receive a letter, please call our office to schedule the follow-up appointment.  Your physician recommends that you continue on your current medications as directed. Please refer to the Current Medication list given to you today.  Your physician has requested that you regularly monitor and record your blood pressure readings at home UNTIL Friday AND CALL OR BRING IN READINGS. Please use the same machine at the same time of day to check your readings and record them to bring to your follow-up visit.  Thank you for choosing [facility]!!

## 2016-05-07 ENCOUNTER — Telehealth: Payer: Self-pay | Admitting: Cardiology

## 2016-05-07 NOTE — Telephone Encounter (Signed)
Patient was told to call the office to give BP readings.  Please call after 2pm

## 2016-05-10 MED ORDER — AMLODIPINE BESYLATE 5 MG PO TABS: 5.0000 mg | ORAL_TABLET | Freq: Every day | ORAL | 6 refills | Status: DC

## 2016-05-10 NOTE — Telephone Encounter (Signed)
Left detailed message on voice mail.  New rx sent to [facility] today.  Asked for return call back to make sure she understood the instructions.

## 2016-05-10 NOTE — Telephone Encounter (Signed)
Bp is running too high. Can we start norvasc 5mg  daily. Resubmit a bp log in 2 weeks   J Dian Laprade MD

## 2016-05-10 NOTE — Telephone Encounter (Signed)
Pt called to report BP per LOV starting Monday - routed to Dr Harl Bowie    2/26 - 150/66 HR 62  2/27 - 143/59       58 2/28 - 161/69       65 3/1   - 161/69       66 3/2    -164/74       Didn't have HR this day

## 2016-05-12 NOTE — Telephone Encounter (Signed)
LM to return call.

## 2016-05-13 ENCOUNTER — Encounter: Payer: Self-pay | Admitting: *Deleted

## 2016-05-13 NOTE — Telephone Encounter (Signed)
Left multiple messages to confirm if pt received message regarding medication and keeping BP log.

## 2016-05-28 ENCOUNTER — Telehealth: Payer: Self-pay | Admitting: Cardiology

## 2016-05-28 NOTE — Telephone Encounter (Signed)
Patient informed. 

## 2016-05-28 NOTE — Telephone Encounter (Signed)
BP reading for past week Mon 130/60 pulse 67 Tues 130/56  65 Wed 132/52  65 Thur  135/53  64 Fri  131/54  66

## 2016-05-28 NOTE — Telephone Encounter (Signed)
BP's look good, no changes   J Rylie Limburg MD 

## 2016-07-26 DIAGNOSIS — H5201 Hypermetropia, right eye: Secondary | ICD-10-CM | POA: Diagnosis not present

## 2016-07-26 DIAGNOSIS — E119 Type 2 diabetes mellitus without complications: Secondary | ICD-10-CM | POA: Diagnosis not present

## 2016-07-26 DIAGNOSIS — H25012 Cortical age-related cataract, left eye: Secondary | ICD-10-CM | POA: Diagnosis not present

## 2016-07-26 DIAGNOSIS — H04123 Dry eye syndrome of bilateral lacrimal glands: Secondary | ICD-10-CM | POA: Diagnosis not present

## 2016-11-05 ENCOUNTER — Ambulatory Visit (INDEPENDENT_AMBULATORY_CARE_PROVIDER_SITE_OTHER): Payer: Medicare Other | Admitting: Cardiology

## 2016-11-05 ENCOUNTER — Encounter: Payer: Self-pay | Admitting: Cardiology

## 2016-11-05 VITALS — BP 120/62 | HR 64 | Ht 61.0 in | Wt 179.0 lb

## 2016-11-05 DIAGNOSIS — I2510 Atherosclerotic heart disease of native coronary artery without angina pectoris: Secondary | ICD-10-CM | POA: Diagnosis not present

## 2016-11-05 DIAGNOSIS — E782 Mixed hyperlipidemia: Secondary | ICD-10-CM | POA: Diagnosis not present

## 2016-11-05 DIAGNOSIS — I10 Essential (primary) hypertension: Secondary | ICD-10-CM | POA: Diagnosis not present

## 2016-11-05 NOTE — Patient Instructions (Signed)

## 2016-11-05 NOTE — Progress Notes (Signed)
Clinical Summary Sabrina Wheeler is a 71 y.o.female seen today for follow up of the following medical problems.   1. CAD - history of NSTEMI, received DES to LAD 01/06/15 at [facility] - 10/2016echo LVEF 40-45%, apical ballooning, grade I diastolic dysfunction - 31/4970 echo LVEF 55-60%, no WMAs, grade I diastolic dysfunction.    - - no recent chest pain. Denies any SOB/DOE   2. Hyperlipidemia 09/2016 TC 139 TG 260 HDL 29 LDL 58 - compliant with statin  3. CKD - followed by pcp and nephrology  4. HTN - she reports history of white coat HTN - compliant with meds. .   SH: husband is patient of mine as well Vita Barley Past Medical History:  Diagnosis Date  . Broken ankle 2008   left   . Diabetes mellitus without complication ([place])   . Hypertension   . Obesity      Allergies  Allergen Reactions  . Macrobid [employer] Macro] Other (See Comments)    Pt states "it paralyzed me"  . Doxycycline Nausea And Vomiting  . Clindamycin/Lincomycin Rash     Current Outpatient Prescriptions  Medication Sig Dispense Refill  . amLODipine (NORVASC) 5 MG tablet Take 1 tablet (5 mg total) by mouth daily. 30 tablet 6  . aspirin EC 81 MG tablet Take 81 mg by mouth daily.    Marland Kitchen atorvastatin (LIPITOR) 80 MG tablet Take 1 tablet (80 mg total) by mouth daily. 90 tablet 3  . Calcium Carb-Cholecalciferol (CALCIUM 600+D) 600-800 MG-UNIT TABS Take 1 tablet by mouth at bedtime.    . carvedilol (COREG) 6.25 MG tablet Take 6.25 mg by mouth 2 (two) times daily with a meal.    . Cholecalciferol 1000 UNITS capsule Take 1,000 Units by mouth at bedtime.    . Chromium-Cinnamon [phone] MCG-MG CAPS Take 1 capsule by mouth daily.    . Cranberry-Vitamin C-Vitamin E (CRANBERRY PLUS VITAMIN C) 4200-20-3 MG-MG-UNIT CAPS Take 1 capsule by mouth daily.    . ferrous gluconate (FERGON) 324 MG tablet Take 324 mg by mouth 2 (two) times daily with a meal.    . fexofenadine (ALLEGRA) 180 MG tablet  Take 90 mg by mouth daily.     . fluticasone (FLONASE) 50 MCG/ACT nasal spray Place 1 spray into both nostrils at bedtime.    . folic acid (FOLVITE) 263 MCG tablet Take 400 mcg by mouth daily.    . furosemide (LASIX) 20 MG tablet Take 20 mg by mouth daily.    Marland Kitchen gabapentin (NEURONTIN) 100 MG capsule Take 200 mg by mouth 2 (two) times daily.    . Glucosamine-Chondroitin (OSTEO BI-FLEX REGULAR STRENGTH PO) Take 1 capsule by mouth 2 (two) times daily.    . L-ARGININE-500 PO Take by mouth 2 (two) times daily.    Marland Kitchen LECITHIN PO Take 1 capsule by mouth daily. 1325mg     . lisinopril (PRINIVIL,ZESTRIL) 5 MG tablet Take 5 mg by mouth daily.    . Multiple Vitamins-Minerals (MULTIVITAMIN WITH MINERALS) tablet Take 1 tablet by mouth daily.    . pantoprazole (PROTONIX) 40 MG tablet Take 40 mg by mouth daily.    . Red Yeast Rice 600 MG TABS Take 600 mg by mouth 2 (two) times daily.    . SODIUM BICARBONATE PO Take by mouth. 10 gr by mouth twice a day    . TURMERIC PO Take by mouth daily.     No current facility-administered medications for this visit.      Past  Surgical History:  Procedure Laterality Date  . INCISIONAL HERNIA REPAIR       Allergies  Allergen Reactions  . Macrobid [employer] Macro] Other (See Comments)    Pt states "it paralyzed me"  . Doxycycline Nausea And Vomiting  . Clindamycin/Lincomycin Rash      Family History  Problem Relation Age of Onset  . Cancer Mother   . Heart disease Father   . Heart attack Father      Social History Ms. Alarid reports that she has never smoked. She has never used smokeless tobacco. Ms. Larouche reports that she does not drink alcohol.   Review of Systems CONSTITUTIONAL: No weight loss, fever, chills, weakness or fatigue.  HEENT: Eyes: No visual loss, blurred vision, double vision or yellow sclerae.No hearing loss, sneezing, congestion, runny nose or sore throat.  SKIN: No rash or itching.  CARDIOVASCULAR: per  hpi RESPIRATORY: No shortness of breath, cough or sputum.  GASTROINTESTINAL: No anorexia, nausea, vomiting or diarrhea. No abdominal pain or blood.  GENITOURINARY: No burning on urination, no polyuria NEUROLOGICAL: No headache, dizziness, syncope, paralysis, ataxia, numbness or tingling in the extremities. No change in bowel or bladder control.  MUSCULOSKELETAL: No muscle, back pain, joint pain or stiffness.  LYMPHATICS: No enlarged nodes. No history of splenectomy.  PSYCHIATRIC: No history of depression or anxiety.  ENDOCRINOLOGIC: No reports of sweating, cold or heat intolerance. No polyuria or polydipsia.  Marland Kitchen   Physical Examination Vitals:   11/05/16 1130  BP: 120/62  Pulse: 64  SpO2: 98%   Vitals:   11/05/16 1130  Weight: 179 lb (81.2 kg)  Height: 5\' 1"  (1.549 m)    Gen: resting comfortably, no acute distress HEENT: no scleral icterus, pupils equal round and reactive, no palptable cervical adenopathy,  CV: RRR, no m/r/g, no jvd Resp: Clear to auscultation bilaterally GI: abdomen is soft, non-tender, non-distended, normal bowel sounds, no hepatosplenomegaly MSK: extremities are warm, no edema.  Skin: warm, no rash Neuro:  no focal deficits Psych: appropriate affect   Diagnostic Studies 12/2014 cath at [facility]: Coronary Angiography 1. Left Main - Normal 2. Left anterior descending artery - heavily calcified 95% proximal, 25% distal 3. Diagonals - 25% ostial 4. Left Circumflex - 25% proximal 5. Obtuse Marginals - 25% 6. Right Coronary Artery - 25% proximal, 50% mid 7. Posterior Descending Artery - Normal Additional comments on angiography: Right Dominance  Hemodynamics 1. Aortic Pressure - 138/58 mmHg 2. Left Ventricular - 138/20 mmHg Additional comments on on hemodynamics: None.   Left Ventriculography:Not Performed.   Percutaneous coronary intervention: An EBU 3.5 guide catheter gave excellent support. The 95% proximal LAD lesion was heavily  calcified and easily crossed with a BMW wire. We predilated with a 2.0 x 15 mm trek balloon. We implanted a 2.25 x 20 mm Synergy drug-eluting stent. This was chosen because of vessel tortuosity heavy calcification. Subsequent angiography demonstrated an excellent result with lesion reduction from 95% to 0% with no complicating features. This was performed using aspirin, Effient,  and heparin.  CONCLUSIONS:  1. Successful transradial cardiac catheterization 2. Obstructive one-vessel coronary artery disease 3. Status post drug-eluting stent implantation to the LAD with lesion reduction from 95% to 0% 4 left ventricular diastolic pressure 20  RECOMMENDATIONS: The patient will need to remain on Aspirin and Effient for minimum of one year.  12/2015 Echo Interpretation Summary A complete two-dimensional transthoracic echocardiogram was performed. The left ventricle is grossly normal size. There is normal left ventricular wall thickness.  The left ventricular ejection fraction is moderately reduced (40-45%). Apical Ballooning Grade I mild diastolic dysfunction; abnormal relaxation pattern. There is mild (1+) mitral regurgitation. Increased Left ventricle filling pressure Mild aortic sclerosis is present with good valvular opening. There is mild to moderate (1-2+) tricuspid regurgitation. The left atrium is mildly dilated.  01/2016 echo Study Conclusions  - Left ventricle: The cavity size was normal. Wall thickness was increased in a pattern of mild LVH. Systolic function was normal. The estimated ejection fraction was in the range of 55% to 60%. Wall motion was normal; there were no regional wall motion abnormalities. Doppler parameters are consistent with abnormal left ventricular relaxation (grade 1 diastolic dysfunction). - Aortic valve: Mildly calcified annulus. Trileaflet. - Mitral valve: Calcified annulus. There was trivial regurgitation. - Right atrium: Central  venous pressure (est): 3 mm Hg. - Atrial septum: No defect or patent foramen ovale was identified. - Tricuspid valve: There was trivial regurgitation. - Pulmonary arteries: PA peak pressure: 16 mm Hg (S). - Pericardium, extracardiac: There was no pericardial effusion.  Impressions:  - Mild LVH with LVEF 55-60%. Grade 1 diastolic dysfunction. Mildly calcified mitral annulus with trivial mitral regurgitation. Mildly calcified aortic annulus. Trivial tricuspid regurgitation with normal estimated PASP. No pericardial effusion.     Assessment and Plan   1. CAD - no recent symptoms.  - continue current meds  2. Hyperlipidemia - continue statin,  3. CKD IV - followed by pcp  4. HTN - at goal, continue current meds       Arnoldo Lenis, M.D.

## 2017-01-17 ENCOUNTER — Other Ambulatory Visit: Payer: Self-pay | Admitting: Cardiology

## 2017-01-26 ENCOUNTER — Encounter: Payer: Self-pay | Admitting: Cardiology

## 2017-05-18 ENCOUNTER — Encounter: Payer: Self-pay | Admitting: Cardiology

## 2017-05-18 ENCOUNTER — Ambulatory Visit: Payer: Medicare Other | Admitting: Cardiology

## 2017-05-18 VITALS — BP 135/71 | HR 64 | Ht 61.0 in | Wt 174.4 lb

## 2017-05-18 DIAGNOSIS — I2510 Atherosclerotic heart disease of native coronary artery without angina pectoris: Secondary | ICD-10-CM | POA: Diagnosis not present

## 2017-05-18 DIAGNOSIS — E782 Mixed hyperlipidemia: Secondary | ICD-10-CM | POA: Diagnosis not present

## 2017-05-18 DIAGNOSIS — I10 Essential (primary) hypertension: Secondary | ICD-10-CM | POA: Diagnosis not present

## 2017-05-18 DIAGNOSIS — R0989 Other specified symptoms and signs involving the circulatory and respiratory systems: Secondary | ICD-10-CM

## 2017-05-18 NOTE — Progress Notes (Signed)
Clinical Summary Ms. Appelbaum is a 72 y.o.female seen today for follow up of the following medical problems.   1. CAD - history of NSTEMI, received DES to LAD 01/06/15 at [facility] - 10/2016echo LVEF 40-45%, apical ballooning, grade I diastolic dysfunction - 16/0109 echo LVEF 55-60%, no WMAs, grade I diastolic dysfunction.    - no chest pain or SOB - compliant with meds   2. Hyperlipidemia 09/2016 TC 139 TG 260 HDL 29 LDL 58 - she is compliant with meds  3. CKD - followed by pcp and nephrology    4. HTN - she reports history of white coat HTN - she is compliant with meds     SH: husband is patient of mine as well Vita Barley. Husband with recent knee injury. Had a fall after, requiring repeat knee procedure. Son in law Summer Shade is getting ready to establish as a patient.    Past Medical History:  Diagnosis Date  . Broken ankle 2008   left   . Diabetes mellitus without complication ([place])   . Hypertension   . Obesity      Allergies  Allergen Reactions  . Macrobid [employer] Macro] Other (See Comments)    Pt states "it paralyzed me"  . Doxycycline Nausea And Vomiting  . Clindamycin/Lincomycin Rash     Current Outpatient Medications  Medication Sig Dispense Refill  . amLODipine (NORVASC) 10 MG tablet Take 10 mg by mouth daily.    Marland Kitchen aspirin EC 81 MG tablet Take 81 mg by mouth daily.    Marland Kitchen atorvastatin (LIPITOR) 80 MG tablet Take 80 mg by mouth daily.    Marland Kitchen atorvastatin (LIPITOR) 80 MG tablet TAKE 1 TABLET BY MOUTH  DAILY 90 tablet 1  . Calcium Carb-Cholecalciferol (CALCIUM 600+D) 600-800 MG-UNIT TABS Take 1 tablet by mouth at bedtime.    . carvedilol (COREG) 6.25 MG tablet Take 6.25 mg by mouth 2 (two) times daily with a meal.    . Cholecalciferol 1000 UNITS capsule Take 1,000 Units by mouth at bedtime.    . Chromium-Cinnamon [phone] MCG-MG CAPS Take 1 capsule by mouth daily.    . Cranberry-Vitamin C-Vitamin E (CRANBERRY PLUS  VITAMIN C) 4200-20-3 MG-MG-UNIT CAPS Take 1 capsule by mouth daily.    . fexofenadine (ALLEGRA) 180 MG tablet Take 90 mg by mouth daily.     . fluticasone (FLONASE) 50 MCG/ACT nasal spray Place 1 spray into both nostrils at bedtime.    . folic acid (FOLVITE) 323 MCG tablet Take 400 mcg by mouth daily.    . furosemide (LASIX) 20 MG tablet Take 20 mg by mouth daily.    Marland Kitchen gabapentin (NEURONTIN) 100 MG capsule Take 200 mg by mouth 2 (two) times daily.    . Glucosamine-Chondroitin (OSTEO BI-FLEX REGULAR STRENGTH PO) Take 1 capsule by mouth 2 (two) times daily.    . L-ARGININE-500 PO Take by mouth 2 (two) times daily.    Marland Kitchen LECITHIN PO Take 1 capsule by mouth daily. 1325mg     . lisinopril (PRINIVIL,ZESTRIL) 5 MG tablet Take 5 mg by mouth daily.    . Multiple Vitamins-Minerals (MULTIVITAMIN WITH MINERALS) tablet Take 1 tablet by mouth daily.    . pantoprazole (PROTONIX) 40 MG tablet Take 40 mg by mouth daily.    . Red Yeast Rice 600 MG TABS Take 600 mg by mouth 2 (two) times daily.    . SODIUM BICARBONATE PO Take by mouth. 10 gr by mouth twice a day    .  TURMERIC PO Take by mouth daily.     No current facility-administered medications for this visit.      Past Surgical History:  Procedure Laterality Date  . INCISIONAL HERNIA REPAIR       Allergies  Allergen Reactions  . Macrobid [employer] Macro] Other (See Comments)    Pt states "it paralyzed me"  . Doxycycline Nausea And Vomiting  . Clindamycin/Lincomycin Rash      Family History  Problem Relation Age of Onset  . Cancer Mother   . Heart disease Father   . Heart attack Father      Social History Ms. Srinivasan reports that  has never smoked. she has never used smokeless tobacco. Ms. Trantham reports that she does not drink alcohol.   Review of Systems CONSTITUTIONAL: No weight loss, fever, chills, weakness or fatigue.  HEENT: Eyes: No visual loss, blurred vision, double vision or yellow sclerae.No hearing loss,  sneezing, congestion, runny nose or sore throat.  SKIN: No rash or itching.  CARDIOVASCULAR:per hpi RESPIRATORY: No shortness of breath, cough or sputum.  GASTROINTESTINAL: No anorexia, nausea, vomiting or diarrhea. No abdominal pain or blood.  GENITOURINARY: No burning on urination, no polyuria NEUROLOGICAL: No headache, dizziness, syncope, paralysis, ataxia, numbness or tingling in the extremities. No change in bowel or bladder control.  MUSCULOSKELETAL: No muscle, back pain, joint pain or stiffness.  LYMPHATICS: No enlarged nodes. No history of splenectomy.  PSYCHIATRIC: No history of depression or anxiety.  ENDOCRINOLOGIC: No reports of sweating, cold or heat intolerance. No polyuria or polydipsia.  Marland Kitchen   Physical Examination Vitals:   05/18/17 0904  BP: 135/71  Pulse: 64  SpO2: 100%   Vitals:   05/18/17 0904  Weight: 174 lb 6.4 oz (79.1 kg)  Height: 5\' 1"  (1.549 m)    Gen: resting comfortably, no acute distress HEENT: no scleral icterus, pupils equal round and reactive, no palptable cervical adenopathy,  CV: RRR, no m/r/g,no jvd Resp: Clear to auscultation bilaterally GI: abdomen is soft, non-tender, non-distended, normal bowel sounds, no hepatosplenomegaly MSK: extremities are warm, no edema.  Skin: warm, no rash Neuro:  no focal deficits Psych: appropriate affect   Diagnostic Studies 12/2014 cath at [facility]: Coronary Angiography 1. Left Main - Normal 2. Left anterior descending artery - heavily calcified 95% proximal, 25% distal 3. Diagonals - 25% ostial 4. Left Circumflex - 25% proximal 5. Obtuse Marginals - 25% 6. Right Coronary Artery - 25% proximal, 50% mid 7. Posterior Descending Artery - Normal Additional comments on angiography: Right Dominance  Hemodynamics 1. Aortic Pressure - 138/58 mmHg 2. Left Ventricular - 138/20 mmHg Additional comments on on hemodynamics: None.   Left Ventriculography:Not Performed.   Percutaneous coronary  intervention: An EBU 3.5 guide catheter gave excellent support. The 95% proximal LAD lesion was heavily calcified and easily crossed with a BMW wire. We predilated with a 2.0 x 15 mm trek balloon. We implanted a 2.25 x 20 mm Synergy drug-eluting stent. This was chosen because of vessel tortuosity heavy calcification. Subsequent angiography demonstrated an excellent result with lesion reduction from 95% to 0% with no complicating features. This was performed using aspirin, Effient,  and heparin.  CONCLUSIONS:  1. Successful transradial cardiac catheterization 2. Obstructive one-vessel coronary artery disease 3. Status post drug-eluting stent implantation to the LAD with lesion reduction from 95% to 0% 4 left ventricular diastolic pressure 20  RECOMMENDATIONS: The patient will need to remain on Aspirin and Effient for minimum of one year.  12/2015 Echo  Interpretation Summary A complete two-dimensional transthoracic echocardiogram was performed. The left ventricle is grossly normal size. There is normal left ventricular wall thickness. The left ventricular ejection fraction is moderately reduced (40-45%). Apical Ballooning Grade I mild diastolic dysfunction; abnormal relaxation pattern. There is mild (1+) mitral regurgitation. Increased Left ventricle filling pressure Mild aortic sclerosis is present with good valvular opening. There is mild to moderate (1-2+) tricuspid regurgitation. The left atrium is mildly dilated.  01/2016 echo Study Conclusions  - Left ventricle: The cavity size was normal. Wall thickness was increased in a pattern of mild LVH. Systolic function was normal. The estimated ejection fraction was in the range of 55% to 60%. Wall motion was normal; there were no regional wall motion abnormalities. Doppler parameters are consistent with abnormal left ventricular relaxation (grade 1 diastolic dysfunction). - Aortic valve: Mildly calcified annulus.  Trileaflet. - Mitral valve: Calcified annulus. There was trivial regurgitation. - Right atrium: Central venous pressure (est): 3 mm Hg. - Atrial septum: No defect or patent foramen ovale was identified. - Tricuspid valve: There was trivial regurgitation. - Pulmonary arteries: PA peak pressure: 16 mm Hg (S). - Pericardium, extracardiac: There was no pericardial effusion.  Impressions:  - Mild LVH with LVEF 55-60%. Grade 1 diastolic dysfunction. Mildly calcified mitral annulus with trivial mitral regurgitation. Mildly calcified aortic annulus. Trivial tricuspid regurgitation with normal estimated PASP. No pericardial effusion.     Assessment and Plan  1. CAD - no symptoms, continue current meds - EKG in clinic SR, no ischemic changes  2. Hyperlipidemia -she will continue statin, request pcp labs.   3. CKD IV - followed by pcp  4. HTN - reasonable control, continue current meds  5. Carotid stenosis - obtain carotid US  F/u 6 months   Arnoldo Lenis, M.D

## 2017-05-18 NOTE — Patient Instructions (Addendum)
Your physician recommends that you schedule a follow-up appointment in: 6 MONTHS WITH DR BRANCH  Your physician recommends that you continue on your current medications as directed. Please refer to the Current Medication list given to you today.  Your physician has requested that you have a carotid duplex. This test is an ultrasound of the carotid arteries in your neck. It looks at blood flow through these arteries that supply the brain with blood. Allow one hour for this exam. There are no restrictions or special instructions.  Thank you for choosing [facility]!!    

## 2017-05-19 ENCOUNTER — Encounter: Payer: Self-pay | Admitting: *Deleted

## 2017-06-01 ENCOUNTER — Other Ambulatory Visit: Payer: Self-pay | Admitting: Cardiology

## 2017-09-07 ENCOUNTER — Ambulatory Visit (INDEPENDENT_AMBULATORY_CARE_PROVIDER_SITE_OTHER): Payer: Medicare Other

## 2017-09-07 DIAGNOSIS — R0989 Other specified symptoms and signs involving the circulatory and respiratory systems: Secondary | ICD-10-CM

## 2017-09-22 ENCOUNTER — Telehealth: Payer: Self-pay | Admitting: *Deleted

## 2017-09-22 NOTE — Telephone Encounter (Signed)
-----   Message from Arnoldo Lenis, MD sent at 09/19/2017 12:47 PM EDT ----- Carotid US shows just mild blockages, continue to monitor  Zandra Abts MD

## 2017-09-22 NOTE — Telephone Encounter (Signed)
Pt aware - routed to pcp  

## 2017-11-29 ENCOUNTER — Ambulatory Visit: Payer: Medicare Other | Admitting: Cardiology

## 2017-11-29 ENCOUNTER — Encounter: Payer: Self-pay | Admitting: Cardiology

## 2017-11-29 VITALS — BP 149/65 | HR 62 | Ht 61.0 in | Wt 180.8 lb

## 2017-11-29 DIAGNOSIS — I2510 Atherosclerotic heart disease of native coronary artery without angina pectoris: Secondary | ICD-10-CM

## 2017-11-29 DIAGNOSIS — I6523 Occlusion and stenosis of bilateral carotid arteries: Secondary | ICD-10-CM

## 2017-11-29 DIAGNOSIS — I10 Essential (primary) hypertension: Secondary | ICD-10-CM | POA: Diagnosis not present

## 2017-11-29 DIAGNOSIS — Z23 Encounter for immunization: Secondary | ICD-10-CM | POA: Diagnosis not present

## 2017-11-29 DIAGNOSIS — E782 Mixed hyperlipidemia: Secondary | ICD-10-CM

## 2017-11-29 NOTE — Patient Instructions (Signed)
Your physician wants you to follow-up in: [place] will receive a reminder letter in the mail two months in advance. If you don't receive a letter, please call our office to schedule the follow-up appointment.  Your physician recommends that you continue on your current medications as directed. Please refer to the Current Medication list given to you today.  Your physician has requested that you regularly monitor and record your blood pressure readings at home. 1 WEEK AND CALL OR BRING IN READINGS Please use the same machine at the same time of day to check your readings  Thank you for choosing [facility]!!

## 2017-11-29 NOTE — Progress Notes (Signed)
Clinical Summary Ms. Cocke is a 72 y.o.female seen today for follow up of the following medical problems.   1. CAD - history of NSTEMI, received DES to LAD 01/06/15 at [facility] - 10/2016echo LVEF 40-45%, apical ballooning, grade I diastolic dysfunction - 95/6387 echo LVEF 55-60%, no WMAs, grade I diastolic dysfunction.     - denies any chest pain, no sob - compliant with meds    2. Hyperlipidemia 09/2016 TC 139 TG 260 HDL 29 LDL 58 10/2017 TC 142 TG 230 HDL 30 LDL 66 -compliant with statin   3. CKD - followed by pcp and nephrology, Dr Hinda Lenis    4. HTN - she reports history of white coat HTN - does not check at home.   5. Carotid stenosis - 09/2017 carotid US RICA 5-64%, LICA 3-32%   SH: husband is patient of mine as well Vita Barley. Husband with recent knee injury. Had a fall after, requiring repeat knee procedure. Son in law [place] is getting ready to establish as a patient.   Recent great child born, little girl. They are going to visit over Thanksgiving.  Past Medical History:  Diagnosis Date  . Broken ankle 2008   left   . Diabetes mellitus without complication ([place])   . Hypertension   . Obesity      Allergies  Allergen Reactions  . Macrobid [employer] Macro] Other (See Comments)    Pt states "it paralyzed me"  . Doxycycline Nausea And Vomiting  . Clindamycin/Lincomycin Rash     Current Outpatient Medications  Medication Sig Dispense Refill  . amLODipine (NORVASC) 10 MG tablet Take 10 mg by mouth daily.    Marland Kitchen aspirin EC 81 MG tablet Take 81 mg by mouth daily.    Marland Kitchen atorvastatin (LIPITOR) 80 MG tablet TAKE 1 TABLET BY MOUTH  DAILY 90 tablet 3  . Calcium Carb-Cholecalciferol (CALCIUM 600+D) 600-800 MG-UNIT TABS Take 1 tablet by mouth at bedtime.    . carvedilol (COREG) 6.25 MG tablet Take 6.25 mg by mouth 2 (two) times daily with a meal.    . Cholecalciferol 1000 UNITS capsule Take 1,000 Units by mouth at  bedtime.    . Chromium-Cinnamon [phone] MCG-MG CAPS Take 1 capsule by mouth daily.    . Cranberry-Vitamin C-Vitamin E (CRANBERRY PLUS VITAMIN C) 4200-20-3 MG-MG-UNIT CAPS Take 1 capsule by mouth daily.    . fexofenadine (ALLEGRA) 180 MG tablet Take 90 mg by mouth daily.     . fluticasone (FLONASE) 50 MCG/ACT nasal spray Place 1 spray into both nostrils at bedtime.    . folic acid (FOLVITE) 951 MCG tablet Take 400 mcg by mouth daily.    . furosemide (LASIX) 20 MG tablet Take 20 mg by mouth daily.    Marland Kitchen gabapentin (NEURONTIN) 100 MG capsule Take 200 mg by mouth 2 (two) times daily.    . Glucosamine-Chondroitin (OSTEO BI-FLEX REGULAR STRENGTH PO) Take 1 capsule by mouth 2 (two) times daily.    . L-ARGININE-500 PO Take by mouth 2 (two) times daily.    Marland Kitchen LECITHIN PO Take 1 capsule by mouth daily. 1325mg     . lisinopril (PRINIVIL,ZESTRIL) 5 MG tablet Take 5 mg by mouth daily.    . Multiple Vitamins-Minerals (MULTIVITAMIN WITH MINERALS) tablet Take 1 tablet by mouth daily.    . pantoprazole (PROTONIX) 40 MG tablet Take 40 mg by mouth daily.    . Red Yeast Rice 600 MG TABS Take 600 mg by mouth 2 (two) times  daily.    . SODIUM BICARBONATE PO Take by mouth. 10 gr by mouth twice a day    . TURMERIC PO Take by mouth daily.     No current facility-administered medications for this visit.      Past Surgical History:  Procedure Laterality Date  . INCISIONAL HERNIA REPAIR       Allergies  Allergen Reactions  . Macrobid [employer] Macro] Other (See Comments)    Pt states "it paralyzed me"  . Doxycycline Nausea And Vomiting  . Clindamycin/Lincomycin Rash      Family History  Problem Relation Age of Onset  . Cancer Mother   . Heart disease Father   . Heart attack Father      Social History Ms. Furr reports that she has never smoked. She has never used smokeless tobacco. Ms. Crute reports that she does not drink alcohol.   Review of Systems CONSTITUTIONAL: No weight  loss, fever, chills, weakness or fatigue.  HEENT: Eyes: No visual loss, blurred vision, double vision or yellow sclerae.No hearing loss, sneezing, congestion, runny nose or sore throat.  SKIN: No rash or itching.  CARDIOVASCULAR: per hpi RESPIRATORY: No shortness of breath, cough or sputum.  GASTROINTESTINAL: No anorexia, nausea, vomiting or diarrhea. No abdominal pain or blood.  GENITOURINARY: No burning on urination, no polyuria NEUROLOGICAL: No headache, dizziness, syncope, paralysis, ataxia, numbness or tingling in the extremities. No change in bowel or bladder control.  MUSCULOSKELETAL: No muscle, back pain, joint pain or stiffness.  LYMPHATICS: No enlarged nodes. No history of splenectomy.  PSYCHIATRIC: No history of depression or anxiety.  ENDOCRINOLOGIC: No reports of sweating, cold or heat intolerance. No polyuria or polydipsia.  Marland Kitchen   Physical Examination Vitals:   11/29/17 1045  BP: (!) 149/65  Pulse: 62   Vitals:   11/29/17 1045  Weight: 180 lb 12.8 oz (82 kg)  Height: 5\' 1"  (1.549 m)    Gen: resting comfortably, no acute distress HEENT: no scleral icterus, pupils equal round and reactive, no palptable cervical adenopathy,  CV: RRR, no m/rg, no jvd Resp: Clear to auscultation bilaterally GI: abdomen is soft, non-tender, non-distended, normal bowel sounds, no hepatosplenomegaly MSK: extremities are warm, no edema.  Skin: warm, no rash Neuro:  no focal deficits Psych: appropriate affect   Diagnostic Studies  12/2014 cath at [facility]: Coronary Angiography 1. Left Main - Normal 2. Left anterior descending artery - heavily calcified 95% proximal, 25% distal 3. Diagonals - 25% ostial 4. Left Circumflex - 25% proximal 5. Obtuse Marginals - 25% 6. Right Coronary Artery - 25% proximal, 50% mid 7. Posterior Descending Artery - Normal Additional comments on angiography: Right Dominance  Hemodynamics 1. Aortic Pressure - 138/58 mmHg 2. Left Ventricular -  138/20 mmHg Additional comments on on hemodynamics: None.   Left Ventriculography:Not Performed.   Percutaneous coronary intervention: An EBU 3.5 guide catheter gave excellent support. The 95% proximal LAD lesion was heavily calcified and easily crossed with a BMW wire. We predilated with a 2.0 x 15 mm trek balloon. We implanted a 2.25 x 20 mm Synergy drug-eluting stent. This was chosen because of vessel tortuosity heavy calcification. Subsequent angiography demonstrated an excellent result with lesion reduction from 95% to 0% with no complicating features. This was performed using aspirin, Effient,  and heparin.  CONCLUSIONS:  1. Successful transradial cardiac catheterization 2. Obstructive one-vessel coronary artery disease 3. Status post drug-eluting stent implantation to the LAD with lesion reduction from 95% to 0% 4 left ventricular  diastolic pressure 20  RECOMMENDATIONS: The patient will need to remain on Aspirin and Effient for minimum of one year.  12/2015 Echo Interpretation Summary A complete two-dimensional transthoracic echocardiogram was performed. The left ventricle is grossly normal size. There is normal left ventricular wall thickness. The left ventricular ejection fraction is moderately reduced (40-45%). Apical Ballooning Grade I mild diastolic dysfunction; abnormal relaxation pattern. There is mild (1+) mitral regurgitation. Increased Left ventricle filling pressure Mild aortic sclerosis is present with good valvular opening. There is mild to moderate (1-2+) tricuspid regurgitation. The left atrium is mildly dilated.  01/2016 echo Study Conclusions  - Left ventricle: The cavity size was normal. Wall thickness was increased in a pattern of mild LVH. Systolic function was normal. The estimated ejection fraction was in the range of 55% to 60%. Wall motion was normal; there were no regional wall motion abnormalities. Doppler parameters are consistent  with abnormal left ventricular relaxation (grade 1 diastolic dysfunction). - Aortic valve: Mildly calcified annulus. Trileaflet. - Mitral valve: Calcified annulus. There was trivial regurgitation. - Right atrium: Central venous pressure (est): 3 mm Hg. - Atrial septum: No defect or patent foramen ovale was identified. - Tricuspid valve: There was trivial regurgitation. - Pulmonary arteries: PA peak pressure: 16 mm Hg (S). - Pericardium, extracardiac: There was no pericardial effusion.  Impressions:  - Mild LVH with LVEF 55-60%. Grade 1 diastolic dysfunction. Mildly calcified mitral annulus with trivial mitral regurgitation. Mildly calcified aortic annulus. Trivial tricuspid regurgitation with normal estimated PASP. No pericardial effusion.   09/2017 carotid US Final Interpretation: Right Carotid: Velocities in the right ICA are consistent with a 1-39% stenosis.  Left Carotid: Velocities in the left ICA are consistent with a 1-39% stenosis.  Vertebrals: Bilateral vertebral arteries demonstrate antegrade flow. Subclavians: Normal flow hemodynamics were seen in bilateral subclavian       arteries.  Assessment and Plan  1. CAD - no symptoms, continue current meds  2. Hyperlipidemia -at goal, continue statin.    3. CKD IV - followed by renal - limit nephrotoxic drugs. Cr mildly improved from last check.   4. HTN - elevated in clinic, has some history of white coat HTN. She will keep a bp log for 1 week.   5. Carotid stenosis - only mild disease by [place], continue to monitor.    F/u 6 months  Arnoldo Lenis, M.D.

## 2017-12-09 ENCOUNTER — Telehealth: Payer: Self-pay | Admitting: *Deleted

## 2017-12-09 NOTE — Telephone Encounter (Signed)
Pt dropped off BP readings per LOV - routed to provider   9/26 - 134/62 HR 68 9/27 - 147/66 HR 66 9/28 - 147/58 HR 69 9/29 - 135/59 HR 66 9/30 - 133/55 HR 65 9/30 - 143/60 HR 70

## 2017-12-12 NOTE — Telephone Encounter (Signed)
BP's mildly elevated, would work on sodium limitation and weight loss. Adjusting her medications is a bit tricky because of her kidney function, would try to avoid starting a whole new medication just for a bp that's mildly elevated   Zandra Abts MD

## 2017-12-12 NOTE — Telephone Encounter (Signed)
Patient notified and verbalized understanding. 

## 2018-11-30 ENCOUNTER — Emergency Department (HOSPITAL_COMMUNITY): Payer: Medicare Other

## 2018-11-30 ENCOUNTER — Encounter (HOSPITAL_COMMUNITY): Payer: Self-pay | Admitting: Emergency Medicine

## 2018-11-30 ENCOUNTER — Other Ambulatory Visit: Payer: Self-pay

## 2018-11-30 ENCOUNTER — Inpatient Hospital Stay (HOSPITAL_COMMUNITY)
Admission: EM | Admit: 2018-11-30 | Discharge: 2018-12-02 | DRG: 872 | Disposition: A | Discharge status: Home / Self Care | Payer: Medicare Other | Attending: Internal Medicine | Admitting: Internal Medicine

## 2018-11-30 DIAGNOSIS — Z6829 Body mass index (BMI) 29.0-29.9, adult: Secondary | ICD-10-CM

## 2018-11-30 DIAGNOSIS — E782 Mixed hyperlipidemia: Secondary | ICD-10-CM | POA: Diagnosis not present

## 2018-11-30 DIAGNOSIS — G8929 Other chronic pain: Secondary | ICD-10-CM | POA: Diagnosis present

## 2018-11-30 DIAGNOSIS — E876 Hypokalemia: Secondary | ICD-10-CM | POA: Insufficient documentation

## 2018-11-30 DIAGNOSIS — L03115 Cellulitis of right lower limb: Secondary | ICD-10-CM | POA: Diagnosis present

## 2018-11-30 DIAGNOSIS — Z888 Allergy status to other drugs, medicaments and biological substances status: Secondary | ICD-10-CM | POA: Diagnosis not present

## 2018-11-30 DIAGNOSIS — I129 Hypertensive chronic kidney disease with stage 1 through stage 4 chronic kidney disease, or unspecified chronic kidney disease: Secondary | ICD-10-CM | POA: Diagnosis present

## 2018-11-30 DIAGNOSIS — L97519 Non-pressure chronic ulcer of other part of right foot with unspecified severity: Secondary | ICD-10-CM | POA: Diagnosis present

## 2018-11-30 DIAGNOSIS — A419 Sepsis, unspecified organism: Secondary | ICD-10-CM | POA: Diagnosis present

## 2018-11-30 DIAGNOSIS — E785 Hyperlipidemia, unspecified: Secondary | ICD-10-CM | POA: Diagnosis present

## 2018-11-30 DIAGNOSIS — E11621 Type 2 diabetes mellitus with foot ulcer: Secondary | ICD-10-CM | POA: Diagnosis present

## 2018-11-30 DIAGNOSIS — L03119 Cellulitis of unspecified part of limb: Secondary | ICD-10-CM | POA: Diagnosis not present

## 2018-11-30 DIAGNOSIS — E669 Obesity, unspecified: Secondary | ICD-10-CM | POA: Diagnosis present

## 2018-11-30 DIAGNOSIS — Z23 Encounter for immunization: Secondary | ICD-10-CM

## 2018-11-30 DIAGNOSIS — Z20828 Contact with and (suspected) exposure to other viral communicable diseases: Secondary | ICD-10-CM | POA: Diagnosis present

## 2018-11-30 DIAGNOSIS — Z7982 Long term (current) use of aspirin: Secondary | ICD-10-CM | POA: Diagnosis not present

## 2018-11-30 DIAGNOSIS — L03116 Cellulitis of left lower limb: Secondary | ICD-10-CM | POA: Diagnosis present

## 2018-11-30 DIAGNOSIS — N183 Chronic kidney disease, stage 3 (moderate): Secondary | ICD-10-CM | POA: Diagnosis present

## 2018-11-30 DIAGNOSIS — Z881 Allergy status to other antibiotic agents status: Secondary | ICD-10-CM

## 2018-11-30 DIAGNOSIS — I10 Essential (primary) hypertension: Secondary | ICD-10-CM

## 2018-11-30 DIAGNOSIS — Z7951 Long term (current) use of inhaled steroids: Secondary | ICD-10-CM | POA: Diagnosis not present

## 2018-11-30 DIAGNOSIS — Z79899 Other long term (current) drug therapy: Secondary | ICD-10-CM | POA: Diagnosis not present

## 2018-11-30 DIAGNOSIS — R531 Weakness: Secondary | ICD-10-CM | POA: Diagnosis not present

## 2018-11-30 DIAGNOSIS — E1122 Type 2 diabetes mellitus with diabetic chronic kidney disease: Secondary | ICD-10-CM | POA: Diagnosis present

## 2018-11-30 DIAGNOSIS — Z8249 Family history of ischemic heart disease and other diseases of the circulatory system: Secondary | ICD-10-CM

## 2018-11-30 DIAGNOSIS — M79606 Pain in leg, unspecified: Secondary | ICD-10-CM

## 2018-11-30 LAB — LACTIC ACID, PLASMA
Lactic Acid, Venous: 1.8 mmol/L (ref 0.5–1.9)
Lactic Acid, Venous: 1 mmol/L (ref 0.5–1.9)

## 2018-11-30 LAB — CBC WITH DIFFERENTIAL/PLATELET
Abs Immature Granulocytes: 0.22 10*3/uL — ABNORMAL HIGH (ref 0.00–0.07)
Basophils Absolute: 0.1 10*3/uL (ref 0.0–0.1)
Basophils Relative: 0 %
Eosinophils Absolute: 0 10*3/uL (ref 0.0–0.5)
Eosinophils Relative: 0 %
HCT: 32 % — ABNORMAL LOW (ref 36.0–46.0)
Hemoglobin: 10.5 g/dL — ABNORMAL LOW (ref 12.0–15.0)
Immature Granulocytes: 1 %
Lymphocytes Relative: 4 %
Lymphs Abs: 1 10*3/uL (ref 0.7–4.0)
MCH: 29.2 pg (ref 26.0–34.0)
MCHC: 32.8 g/dL (ref 30.0–36.0)
MCV: 88.9 fL (ref 80.0–100.0)
Monocytes Absolute: 1.6 10*3/uL — ABNORMAL HIGH (ref 0.1–1.0)
Monocytes Relative: 7 %
Neutro Abs: 20.3 10*3/uL — ABNORMAL HIGH (ref 1.7–7.7)
Neutrophils Relative %: 88 %
Platelets: 222 10*3/uL (ref 150–400)
RBC: 3.6 MIL/uL — ABNORMAL LOW (ref 3.87–5.11)
RDW: 14.9 % (ref 11.5–15.5)
WBC: 23.2 10*3/uL — ABNORMAL HIGH (ref 4.0–10.5)
nRBC: 0 % (ref 0.0–0.2)

## 2018-11-30 LAB — HEPATIC FUNCTION PANEL
ALT: 19 U/L (ref 0–44)
AST: 20 U/L (ref 15–41)
Albumin: 2.8 g/dL — ABNORMAL LOW (ref 3.5–5.0)
Alkaline Phosphatase: 62 U/L (ref 38–126)
Bilirubin, Direct: 0.1 mg/dL (ref 0.0–0.2)
Indirect Bilirubin: 0.6 mg/dL (ref 0.3–0.9)
Total Bilirubin: 0.7 mg/dL (ref 0.3–1.2)
Total Protein: 5.7 g/dL — ABNORMAL LOW (ref 6.5–8.1)

## 2018-11-30 LAB — URINALYSIS, ROUTINE W REFLEX MICROSCOPIC
Bilirubin Urine: NEGATIVE
Glucose, UA: 50 mg/dL — AB
Hgb urine dipstick: NEGATIVE
Ketones, ur: NEGATIVE mg/dL
Leukocytes,Ua: NEGATIVE
Nitrite: NEGATIVE
Protein, ur: >=300 mg/dL — AB
Specific Gravity, Urine: 1.015 (ref 1.005–1.030)
pH: 6 (ref 5.0–8.0)

## 2018-11-30 LAB — BASIC METABOLIC PANEL
Anion gap: 9 (ref 5–15)
BUN: 28 mg/dL — ABNORMAL HIGH (ref 8–23)
CO2: 24 mmol/L (ref 22–32)
Calcium: 7.8 mg/dL — ABNORMAL LOW (ref 8.9–10.3)
Chloride: 105 mmol/L (ref 98–111)
Creatinine, Ser: 1.98 mg/dL — ABNORMAL HIGH (ref 0.44–1.00)
GFR calc Af Amer: 28 mL/min — ABNORMAL LOW (ref >60)
GFR calc non Af Amer: 24 mL/min — ABNORMAL LOW (ref >60)
Glucose, Bld: 199 mg/dL — ABNORMAL HIGH (ref 70–99)
Potassium: 3.2 mmol/L — ABNORMAL LOW (ref 3.5–5.1)
Sodium: 138 mmol/L (ref 135–145)

## 2018-11-30 LAB — APTT: aPTT: 30 seconds (ref 24–36)

## 2018-11-30 LAB — SEDIMENTATION RATE: Sed Rate: 42 mm/hr — ABNORMAL HIGH (ref 0–22)

## 2018-11-30 LAB — VITAMIN B12: Vitamin B-12: 323 pg/mL (ref 180–914)

## 2018-11-30 LAB — TSH: TSH: 0.882 u[IU]/mL (ref 0.350–4.500)

## 2018-11-30 LAB — SARS CORONAVIRUS 2 (TAT 6-24 HRS): SARS Coronavirus 2: NEGATIVE

## 2018-11-30 LAB — CK: Total CK: 63 U/L (ref 38–234)

## 2018-11-30 LAB — PROTIME-INR
INR: 1.1 (ref 0.8–1.2)
Prothrombin Time: 14.5 seconds (ref 11.4–15.2)

## 2018-11-30 LAB — C-REACTIVE PROTEIN: CRP: 13.4 mg/dL — ABNORMAL HIGH (ref <1.0)

## 2018-11-30 LAB — PROCALCITONIN: Procalcitonin: 0.33 ng/mL

## 2018-11-30 LAB — FOLATE: Folate: 23.3 ng/mL (ref >5.9)

## 2018-11-30 MED ORDER — PANTOPRAZOLE SODIUM 40 MG PO TBEC
40.0000 mg | DELAYED_RELEASE_TABLET | Freq: Every day | ORAL | Status: DC
  Administered 2018-11-30 – 2018-12-02 (×3): 40 mg via ORAL
  Filled 2018-11-30 (×3): qty 1

## 2018-11-30 MED ORDER — ASPIRIN EC 81 MG PO TBEC
81.0000 mg | DELAYED_RELEASE_TABLET | Freq: Every day | ORAL | Status: DC
  Administered 2018-11-30 – 2018-12-02 (×3): 81 mg via ORAL
  Filled 2018-11-30 (×3): qty 1

## 2018-11-30 MED ORDER — LORATADINE 10 MG PO TABS
10.0000 mg | ORAL_TABLET | Freq: Every day | ORAL | Status: DC
  Administered 2018-12-01 – 2018-12-02 (×2): 10 mg via ORAL
  Filled 2018-11-30 (×2): qty 1

## 2018-11-30 MED ORDER — FOLIC ACID 1 MG PO TABS
1.0000 mg | ORAL_TABLET | Freq: Every day | ORAL | Status: DC
  Administered 2018-12-01 – 2018-12-02 (×2): 1 mg via ORAL
  Filled 2018-11-30 (×2): qty 1

## 2018-11-30 MED ORDER — ACETAMINOPHEN 650 MG RE SUPP: 650.0000 mg | Freq: Four times a day (QID) | RECTAL | Status: DC | PRN

## 2018-11-30 MED ORDER — POTASSIUM CHLORIDE 10 MEQ/100ML IV SOLN
10.0000 meq | Freq: Once | INTRAVENOUS | Status: AC
  Administered 2018-11-30: 10 meq via INTRAVENOUS
  Filled 2018-11-30: qty 100

## 2018-11-30 MED ORDER — PRAVASTATIN SODIUM 10 MG PO TABS: 20.0000 mg | ORAL_TABLET | Freq: Every day | ORAL | Status: DC

## 2018-11-30 MED ORDER — FLUTICASONE PROPIONATE 50 MCG/ACT NA SUSP
1.0000 | Freq: Every day | NASAL | Status: DC
  Administered 2018-12-01: 1 via NASAL

## 2018-11-30 MED ORDER — VITAMIN D 25 MCG (1000 UNIT) PO TABS
1000.0000 [IU] | ORAL_TABLET | Freq: Every day | ORAL | Status: DC
  Administered 2018-11-30 – 2018-12-01 (×2): 1000 [IU] via ORAL
  Filled 2018-11-30 (×2): qty 1

## 2018-11-30 MED ORDER — GABAPENTIN 100 MG PO CAPS
200.0000 mg | ORAL_CAPSULE | Freq: Two times a day (BID) | ORAL | Status: DC
  Administered 2018-11-30 – 2018-12-02 (×4): 200 mg via ORAL
  Filled 2018-11-30 (×4): qty 2

## 2018-11-30 MED ORDER — VANCOMYCIN HCL IN DEXTROSE 1-5 GM/200ML-% IV SOLN
1000.0000 mg | INTRAVENOUS | Status: DC
  Administered 2018-12-01: 1000 mg via INTRAVENOUS
  Filled 2018-11-30: qty 200

## 2018-11-30 MED ORDER — SODIUM CHLORIDE 0.9 % IV SOLN
1.0000 g | Freq: Once | INTRAVENOUS | Status: AC
  Administered 2018-11-30: 1 g via INTRAVENOUS
  Filled 2018-11-30: qty 10

## 2018-11-30 MED ORDER — ACETAMINOPHEN 325 MG PO TABS: 650.0000 mg | ORAL_TABLET | Freq: Four times a day (QID) | ORAL | Status: DC | PRN

## 2018-11-30 MED ORDER — ONDANSETRON HCL 4 MG PO TABS: 4.0000 mg | ORAL_TABLET | Freq: Four times a day (QID) | ORAL | Status: DC | PRN

## 2018-11-30 MED ORDER — VANCOMYCIN HCL 1.5 G IV SOLR
1500.0000 mg | Freq: Once | INTRAVENOUS | Status: AC
  Administered 2018-11-30: 1500 mg via INTRAVENOUS
  Filled 2018-11-30: qty 1500

## 2018-11-30 MED ORDER — ADULT MULTIVITAMIN W/MINERALS CH
1.0000 | ORAL_TABLET | Freq: Every day | ORAL | Status: DC
  Administered 2018-12-01 – 2018-12-02 (×2): 1 via ORAL
  Filled 2018-11-30 (×2): qty 1

## 2018-11-30 MED ORDER — ACETAMINOPHEN 325 MG PO TABS: 650.0000 mg | ORAL_TABLET | Freq: Once | ORAL | Status: DC

## 2018-11-30 MED ORDER — INFLUENZA VAC A&B SA ADJ QUAD 0.5 ML IM PRSY
0.5000 mL | PREFILLED_SYRINGE | INTRAMUSCULAR | Status: AC
  Administered 2018-12-01: 0.5 mL via INTRAMUSCULAR
  Filled 2018-11-30: qty 0.5

## 2018-11-30 MED ORDER — ATORVASTATIN CALCIUM 40 MG PO TABS
80.0000 mg | ORAL_TABLET | Freq: Every day | ORAL | Status: DC
  Administered 2018-12-01: 80 mg via ORAL
  Filled 2018-11-30: qty 2

## 2018-11-30 MED ORDER — HEPARIN SODIUM (PORCINE) 5000 UNIT/ML IJ SOLN
5000.0000 [IU] | Freq: Three times a day (TID) | INTRAMUSCULAR | Status: DC
  Administered 2018-12-01 – 2018-12-02 (×4): 5000 [IU] via SUBCUTANEOUS
  Filled 2018-11-30 (×5): qty 1

## 2018-11-30 MED ORDER — POLYSACCHARIDE IRON COMPLEX 150 MG PO CAPS
150.0000 mg | ORAL_CAPSULE | Freq: Every day | ORAL | Status: DC
  Administered 2018-12-01 – 2018-12-02 (×2): 150 mg via ORAL
  Filled 2018-11-30 (×3): qty 1

## 2018-11-30 MED ORDER — LISINOPRIL 5 MG PO TABS
5.0000 mg | ORAL_TABLET | Freq: Every day | ORAL | Status: DC
  Administered 2018-12-01 – 2018-12-02 (×2): 5 mg via ORAL
  Filled 2018-11-30 (×2): qty 1

## 2018-11-30 MED ORDER — POTASSIUM CHLORIDE IN NACL 20-0.9 MEQ/L-% IV SOLN
INTRAVENOUS | Status: DC
  Administered 2018-11-30: via INTRAVENOUS

## 2018-11-30 MED ORDER — SODIUM CHLORIDE 0.9 % IV SOLN
2.0000 g | INTRAVENOUS | Status: DC
  Administered 2018-11-30 – 2018-12-01 (×2): 2 g via INTRAVENOUS
  Filled 2018-11-30 (×2): qty 2

## 2018-11-30 MED ORDER — ONDANSETRON HCL 4 MG/2ML IJ SOLN: 4.0000 mg | Freq: Four times a day (QID) | INTRAMUSCULAR | Status: DC | PRN

## 2018-11-30 MED ORDER — CARVEDILOL 3.125 MG PO TABS
6.2500 mg | ORAL_TABLET | Freq: Two times a day (BID) | ORAL | Status: DC
  Administered 2018-11-30 – 2018-12-02 (×4): 6.25 mg via ORAL
  Filled 2018-11-30 (×4): qty 2

## 2018-11-30 MED ORDER — SODIUM CHLORIDE 0.9 % IV SOLN
INTRAVENOUS | Status: DC
  Administered 2018-11-30: 10:00:00 via INTRAVENOUS

## 2018-11-30 MED ORDER — SODIUM BICARBONATE 650 MG PO TABS
650.0000 mg | ORAL_TABLET | Freq: Two times a day (BID) | ORAL | Status: DC
  Administered 2018-11-30 – 2018-12-02 (×4): 650 mg via ORAL
  Filled 2018-11-30 (×4): qty 1

## 2018-11-30 NOTE — ED Provider Notes (Signed)
[facility] EMERGENCY DEPARTMENT Provider Note   CSN: [id number] Arrival date & time: 11/30/18  1884     History   Chief Complaint Chief Complaint  Patient presents with  . Weakness    HPI Sabrina Wheeler is a 73 y.o. female.     HPI  This patient is a 73 year old female, she has a known history of hypertension, some chronic pain, some vascular insufficiency to her legs, she has a healing wound on the bottom of her right foot from stepping on a rock.  She has a known elevated A1c but does not currently take any diabetic medications.  According to the paramedics who transported the patient they report that she was in her usual state of health until this morning when she seemed to be a little bit confused to family, she required slight assistance getting to her chair from her bed this morning by her daughter but then felt like she could not move either of her legs and that her weakness was getting worse.  She was unable to stand, when the paramedics arrived they were able to help her to her feet but she could not walk.  She felt like she was going to fall backwards into her chair.  She denies fever, chills, nausea, vomiting, diarrhea, poor appetite, chest pain, cough, shortness of breath, sore throat, blurred vision, headache, numbness or focal weakness.  There is been no dysuria, hematuria or significant rashes.  The redness that she has on the bilateral lower extremity seems to be improving over time and she states it is been much worse in the past.  There was a report from the paramedics that the family members measured a temperature of 102, the paramedics measured a temperature and 99 range.  No medications were given prehospital.  Vital signs were normal as reported by paramedics.  Past Medical History:  Diagnosis Date  . Broken ankle 2008   left   . Diabetes mellitus without complication ([place])   . Hypertension   . Obesity     Patient Active Problem List   Diagnosis Date Noted   . Ischemic ulcer ([place]) 11/08/2013  . Atherosclerosis of native arteries of the extremities with ulceration(440.23) 11/08/2013    Past Surgical History:  Procedure Laterality Date  . INCISIONAL HERNIA REPAIR       OB History   No obstetric history on file.      Home Medications    Prior to Admission medications   Medication Sig Start Date End Date Taking? Authorizing Provider  amLODipine (NORVASC) 10 MG tablet Take 10 mg by mouth daily.    [provider]  aspirin EC 81 MG tablet Take 81 mg by mouth daily.    [provider]  Calcium Carb-Cholecalciferol (CALCIUM 600+D) 600-800 MG-UNIT TABS Take 1 tablet by mouth at bedtime.    [provider]  carvedilol (COREG) 6.25 MG tablet Take 6.25 mg by mouth 2 (two) times daily with a meal.    [provider]  Cholecalciferol 1000 UNITS capsule Take 1,000 Units by mouth at bedtime.    [provider]  Chromium-Cinnamon [phone] MCG-MG CAPS Take 1 capsule by mouth daily.    [provider]  Cranberry-Vitamin C-Vitamin E (CRANBERRY PLUS VITAMIN C) 4200-20-3 MG-MG-UNIT CAPS Take 1 capsule by mouth daily.    [provider]  fexofenadine (ALLEGRA) 180 MG tablet Take 90 mg by mouth daily.     [provider]  fluticasone (FLONASE) 50 MCG/ACT nasal spray Place  1 spray into both nostrils at bedtime.    [provider]  folic acid (FOLVITE) 408 MCG tablet Take 400 mcg by mouth daily.    [provider]  furosemide (LASIX) 20 MG tablet Take 20 mg by mouth daily.    [provider]  gabapentin (NEURONTIN) 100 MG capsule Take 200 mg by mouth 2 (two) times daily.    [provider]  Glucosamine-Chondroitin (OSTEO BI-FLEX REGULAR STRENGTH PO) Take 1 capsule by mouth 2 (two) times daily.    [provider]  L-ARGININE-500 PO Take by mouth 2 (two) times daily.    [provider]  LECITHIN PO Take 1 capsule by mouth daily. 1325mg      [provider]  lisinopril (PRINIVIL,ZESTRIL) 5 MG tablet Take 5 mg by mouth daily.    [provider]  Multiple Vitamins-Minerals (MULTIVITAMIN WITH MINERALS) tablet Take 1 tablet by mouth daily.    [provider]  pantoprazole (PROTONIX) 40 MG tablet Take 40 mg by mouth daily.    [provider]  pravastatin (PRAVACHOL) 20 MG tablet Take 1 tablet by mouth at bedtime. 04/22/15   [provider]  Red Yeast Rice 600 MG TABS Take 600 mg by mouth 2 (two) times daily.    [provider]  SODIUM BICARBONATE PO Take by mouth. 10 gr by mouth twice a day    [provider]  TURMERIC PO Take by mouth daily.    [provider]    Family History Family History  Problem Relation Age of Onset  . Cancer Mother   . Heart disease Father   . Heart attack Father     Social History Social History   Tobacco Use  . Smoking status: Never Smoker  . Smokeless tobacco: Never Used  Substance Use Topics  . Alcohol use: No  . Drug use: No     Allergies   Macrobid [nitrofurantoin monohyd macro], Doxycycline, and Clindamycin/lincomycin   Review of Systems Review of Systems  All other systems reviewed and are negative.    Physical Exam Updated Vital Signs There were no vitals taken for this visit.  Physical Exam Vitals signs and nursing note reviewed.  Constitutional:      General: She is not in acute distress.    Appearance: She is well-developed.  HENT:     Head: Normocephalic and atraumatic.     Mouth/Throat:     Pharynx: No oropharyngeal exudate.  Eyes:     General: No scleral icterus.       Right eye: No discharge.        Left eye: No discharge.     Conjunctiva/sclera: Conjunctivae normal.     Pupils: Pupils are equal, round, and reactive to light.  Neck:     Musculoskeletal: Normal range of motion and neck supple.     Thyroid: No thyromegaly.     Vascular: No JVD.  Cardiovascular:     Rate and Rhythm:  Normal rate and regular rhythm.     Heart sounds: Murmur ( soft systolic murmur) present. No friction rub. No gallop.   Pulmonary:     Effort: Pulmonary effort is normal. No respiratory distress.     Breath sounds: Normal breath sounds. No wheezing or rales.  Abdominal:     General: Bowel sounds are normal. There is no distension.     Palpations: Abdomen is soft. There is no mass.     Tenderness: There is no abdominal tenderness.  Musculoskeletal: Normal  range of motion.        General: No tenderness.     Right lower leg: Edema present.     Left lower leg: Edema present.     Comments: She does have an open wound to the bottom of the right foot, this appears to be healing, there is no surrounding drainage swelling redness or foul smell  Lymphadenopathy:     Cervical: No cervical adenopathy.  Skin:    General: Skin is warm and dry.     Findings: Erythema and rash present.  Neurological:     General: No focal deficit present.     Mental Status: She is alert.     Coordination: Coordination normal.     Comments: The patient is able to move all 4 extremities with normal strength including straight leg raise against resistance, she is able to lift both arms with normal grips, there is no facial droop, clear speech, cranial nerves III through XII are intact.  She is able to perform finger-nose-finger and perform all of the tasks that I request.  Her speech is totally clear and her level of alertness is totally aware and awake.  Psychiatric:        Behavior: Behavior normal.      ED Treatments / Results  Labs (all labs ordered are listed, but only abnormal results are displayed) Labs Reviewed - No data to display  EKG EKG Interpretation  Date/Time:  Thursday November 30 2018 09:38:58 EDT Ventricular Rate:  79 PR Interval:    QRS Duration: 112 QT Interval:  423 QTC Calculation: 485 R Axis:   -17 Text Interpretation:  Sinus rhythm Prolonged PR interval Abnormal R-wave progression,  late transition Probable left ventricular hypertrophy Baseline wander in lead(s) V2 since last tracing no significant change Confirmed by Noemi Chapel [phone]) on 11/30/2018 9:42:53 AM   Radiology Dg Chest Port 1 View  Result Date: 11/30/2018 CLINICAL DATA:  Fever, weakness EXAM: PORTABLE CHEST 1 VIEW COMPARISON:  12/27/2014 FINDINGS: The heart size and mediastinal contours are within normal limits. Both lungs are clear. The visualized skeletal structures are unremarkable. IMPRESSION: No acute cardiopulmonary findings. Electronically Signed   By: Davina Poke M.D.   On: 11/30/2018 10:48   Dg Foot Complete Right  Result Date: 11/30/2018 CLINICAL DATA:  Chronic open wound on the right foot for 2 years. Fever. EXAM: RIGHT FOOT COMPLETE - 3+ VIEW COMPARISON:  None. FINDINGS: There is no evidence of osteomyelitis or significant arthritis. Calcaneal enthesophytes. Soft tissue swelling of the forefoot. Soft tissue ulcer noted in the ball of the foot. IMPRESSION: Soft tissue ulcer in the ball of the foot. Otherwise, normal exam. Specifically, no evidence of osteomyelitis. Electronically Signed   By: Lorriane Shire M.D.   On: 11/30/2018 10:51    Procedures Procedures (including critical care time)  Medications Ordered in ED Medications  0.9 %  sodium chloride infusion ( Intravenous New Bag/Given 11/30/18 1021)  acetaminophen (TYLENOL) tablet 650 mg (650 mg Oral Refused 11/30/18 1023)  cefTRIAXone (ROCEPHIN) 1 g in sodium chloride 0.9 % 100 mL IVPB (1 g Intravenous New Bag/Given 11/30/18 1148)  potassium chloride 10 mEq in 100 mL IVPB (10 mEq Intravenous New Bag/Given 11/30/18 1145)     Initial Impression / Assessment and Plan / ED Course  I have reviewed the triage vital signs and the nursing notes.  Pertinent labs & imaging results that were available during my care of the patient were reviewed by me and considered in my medical  decision making (see chart for details).  Clinical Course as of Nov 30 1215  Thu Nov 30, 2018  1047 I have personally looked at the x-ray, there is no signs of infiltrate.  The blood count is severely elevated over 22,000 suggestive of an acute bacterial process.  Thankfully the vital signs are unremarkable and there is no fever on rectal temperature   [BM]  1127 Compared with prior results the patient's creatinine is at her baseline at 1.98.  Mild hypokalemia   [BM]  1217 D/w Dr. Carles Collet - will admit   [BM]    Clinical Course User Index [BM] Noemi Chapel, MD       We will investigate the cause of the patient's weakness which could be a febrile illness, she is warm to the touch, she does have slight redness of her bilateral ankles and pretibial region, may need cellulitis coverage if no other source is found however at this time she appears very well and does not have any focal weakness.  The history and exam do not suggest stroke as a source or cause of the patient's symptoms.  At this time will proceed with labs, Tylenol, rectal temperature, urinalysis, patient agreeable.  Final Clinical Impressions(s) / ED Diagnoses   Final diagnoses:  Cellulitis of lower extremity, unspecified laterality  Hypokalemia  Weakness    ED Discharge Orders    None       Noemi Chapel, MD 11/30/18 1217

## 2018-11-30 NOTE — H&P (Signed)
History and Physical  Sabrina Wheeler BUL:[id number] DOB: December 20, 1945 DOA: 11/30/2018   PCP: Wyatt Haste, NP   Patient coming from: Home  Chief Complaint: generalized weakness, fever, confustion  HPI:  Sabrina Wheeler is a 73 y.o. female with medical history of hypertension, diabetes mellitus type 2 not on any agents, hyperlipidemia, peripheral neuropathy presenting with generalized weakness, fever, and confusion that began in the early morning 11/30/2018.  The patient had been in her usual state of health ambulating with assistance of a cane up until the morning of 11/30/2018 at which time, the patient had generalized weakness with difficulty transferring from the bed to the commode.  She required assistance of her daughter to get onto her feet.  Daughter stated that the patient had bilateral leg weakness to the point where she was unable to bear weight.  Apparently, the patient's daughter took the patient's temperature with a forehead scanner and it registered 102.0 F.  In addition, the patient's daughter has noted that the patient has been increasingly confused for the past 3 to 4 days.  EMS was activated.  Upon arrival, the patient was noted to have an extra 99.1 F.  Daughter stated by the time the EMS arrived, the patient was a bit more lucid and stronger to the point where she was able to bear weight on her legs and transfer to the gurney.  There is been no new medications for the past month.  The patient denies any headache, neck pain, chest pain, shortness breath, coughing, hemoptysis, nausea, vomiting, diarrhea, abdominal pain, dysuria, hematuria.  She has chronic loose stools.  There is no hematochezia or melena. In the emergency department, the patient had a temperature 9 9.1 F.  She was hemodynamically stable with oxygen saturation 100% on room air.  BMP showed a potassium 3.2 with serum creatinine 1.98 which is around her baseline.  WBC was 23.2 with hemoglobin 10.5 which is around  her baseline her lactic acid was 1.8.  Chest x-ray was negative for any acute infiltrates.  Urinalysis was negative for pyuria.  The patient was admitted for further evaluation of possible sepsis.  She was started on IV fluids and given a dose of intravenous ceftriaxone.  Assessment/Plan: Sepsis -Presented with fever and elevated WBC -Source undifferentiated presently, for suspect soft tissue infection/cellulitis of the legs -Lactic acid 1.8 -Cycle lactic acid -Check procalcitonin -Start vancomycin and cefepime empirically -Follow blood culture -Chest x-ray negative for infiltrates or edema  Diabetic foot ulcer -Concerned about cellulitis of the lower extremities -Check ESR -Check CRP -MRI right foot -see pictures below  Diabetes mellitus type 2 -Hemoglobin A1c -NovoLog sliding scale  Hyperlipidemia -Continue statin  Essential hypertension -Holding amlodipine -Restart carvedilol and lisinopril  CKD stage III -Baseline creatinine 1.9-2.2  Hypokalemia -replete -check mag       Past Medical History:  Diagnosis Date  . Broken ankle 2008   left   . Diabetes mellitus without complication ([place])   . Hypertension   . Obesity    Past Surgical History:  Procedure Laterality Date  . INCISIONAL HERNIA REPAIR     Social History:  reports that she has never smoked. She has never used smokeless tobacco. She reports that she does not drink alcohol or use drugs.  FAmily history Family History  Problem Relation Age of Onset  . Cancer Mother   . Heart disease Father   . Heart attack Father      Allergies  Allergen Reactions  .  Macrobid [employer] Macro] Other (See Comments)    Pt states "it paralyzed me"  . Doxycycline Nausea And Vomiting  . Clindamycin/Lincomycin Rash     Prior to Admission medications   Medication Sig Start Date End Date Taking? Authorizing Provider  amLODipine (NORVASC) 10 MG tablet Take 10 mg by mouth daily.    [provider]  aspirin EC 81 MG tablet Take 81 mg by mouth daily.    [provider]  atorvastatin (LIPITOR) 80 MG tablet Take 1 tablet by mouth daily. 09/25/18   [provider]  Calcium Carb-Cholecalciferol (CALCIUM 600+D) 600-800 MG-UNIT TABS Take 1 tablet by mouth at bedtime.    [provider]  carvedilol (COREG) 6.25 MG tablet Take 6.25 mg by mouth 2 (two) times daily with a meal.    [provider]  Cholecalciferol 1000 UNITS capsule Take 1,000 Units by mouth at bedtime.    [provider]  Chromium-Cinnamon [phone] MCG-MG CAPS Take 1 capsule by mouth daily.    [provider]  Cranberry-Vitamin C-Vitamin E (CRANBERRY PLUS VITAMIN C) 4200-20-3 MG-MG-UNIT CAPS Take 1 capsule by mouth daily.    [provider]  fexofenadine (ALLEGRA) 180 MG tablet Take 90 mg by mouth daily.     [provider]  fluticasone (FLONASE) 50 MCG/ACT nasal spray Place 1 spray into both nostrils at bedtime.    [provider]  folic acid (FOLVITE) 940 MCG tablet Take 400 mcg by mouth daily.    [provider]  furosemide (LASIX) 20 MG tablet Take 20 mg by mouth daily.    [provider]  gabapentin (NEURONTIN) 100 MG capsule Take 200 mg by mouth 2 (two) times daily.    [provider]  Glucosamine-Chondroitin (OSTEO BI-FLEX REGULAR STRENGTH PO) Take 1 capsule by mouth 2 (two) times daily.    [provider]  iron polysaccharides (NIFEREX) 150 MG capsule Take 1 capsule by mouth daily.    [provider]  L-ARGININE-500 PO Take by mouth 2 (two) times daily.    [provider]  LECITHIN PO Take 1 capsule by mouth daily. 1374m    [provider]  lisinopril (PRINIVIL,ZESTRIL) 5 MG tablet Take 5 mg by mouth daily.    [provider]  Multiple Vitamins-Minerals (MULTIVITAMIN WITH MINERALS) tablet Take 1 tablet by mouth daily.    [provider]  pantoprazole  (PROTONIX) 40 MG tablet Take 40 mg by mouth daily.    [provider]  pravastatin (PRAVACHOL) 20 MG tablet Take 1 tablet by mouth at bedtime. 04/22/15   [provider]  Red Yeast Rice 600 MG TABS Take 600 mg by mouth 2 (two) times daily.    [provider]  sodium bicarbonate 650 MG tablet Take 1 tablet by mouth 2 (two) times daily. 08/21/18   [provider]  TURMERIC PO Take by mouth daily.    [provider]    Review of Systems:  Constitutional:  No weight loss, night sweats, Fevers, chills, fatigue.  Head&Eyes: No headache.  No vision loss.  No eye pain or scotoma ENT:  No Difficulty swallowing,Tooth/dental problems,Sore throat,  No ear ache, post nasal drip,  Cardio-vascular:  No chest pain, Orthopnea, PND, swelling in lower extremities,  dizziness, palpitations  GI:  No  abdominal pain, nausea, vomiting, diarrhea, loss of appetite, hematochezia, melena, heartburn, indigestion, Resp:  No shortness of breath with exertion or at rest. No cough. No coughing up of blood .No  wheezing.No chest wall deformity  Skin:  no rash or lesions.  GU:  no dysuria, change in color of urine, no urgency or frequency. No flank pain.  Musculoskeletal:  No joint pain or swelling. No decreased range of motion. No back pain.  Psych:  No change in mood or affect. No depression or anxiety. Neurologic: No headache, no dysesthesia, no focal weakness, no vision loss. No syncope  Physical Exam: Vitals:   11/30/18 1108 11/30/18 1130 11/30/18 1200 11/30/18 1230  BP: (!) 136/57 (!) 143/52 (!) 145/50 (!) 153/54  Pulse: 70 72 72 76  Resp: (!) _0 Temp:      TempSrc:      SpO2: 100% 97% 98% 99%  Weight:      Height:       General:  A&O x 2, NAD, nontoxic, pleasant/cooperative Head/Eye: No conjunctival hemorrhage, no icterus, [place]/AT, No nystagmus ENT:  No icterus,  No thrush, good dentition, no pharyngeal exudate Neck:  No masses, no lymphadenpathy,  no bruits CV:  RRR, no rub, no gallop, no S3 Lung:  CTAB, good air movement, no wheeze, no rhonchi Abdomen: soft/NT, +BS, nondistended, no peritoneal signs Ext: No cyanosis, No rashes, No petechiae, No lymphangitis, 1+LE edema--see pictures below  Neuro: CNII-XII intact, strength 4/5 in bilateral upper and lower extremities, no dysmetria            Labs on Admission:  Basic Metabolic Panel: Recent Labs  Lab 11/30/18 1024  NA 138  K 3.2*  CL 105  CO2 24  GLUCOSE 199*  BUN 28*  CREATININE 1.98*  CALCIUM 7.8*   Liver Function Tests: No results for input(s): AST, ALT, ALKPHOS, BILITOT, PROT, ALBUMIN in the last 168 hours. No results for input(s): LIPASE, AMYLASE in the last 168 hours. No results for input(s): AMMONIA in the last 168 hours. CBC: Recent Labs  Lab 11/30/18 1024  WBC 23.2*  NEUTROABS 20.3*  HGB 10.5*  HCT 32.0*  MCV 88.9  PLT 222   Coagulation Profile: No results for input(s): INR, PROTIME in the last 168 hours. Cardiac Enzymes: No results for input(s): CKTOTAL, CKMB, CKMBINDEX, TROPONINI in the last 168 hours. BNP: Invalid input(s): POCBNP CBG: No results for input(s): GLUCAP in the last 168 hours. Urine analysis:    Component Value Date/Time   COLORURINE YELLOW 11/30/2018 0933   APPEARANCEUR CLEAR 11/30/2018 0933   LABSPEC 1.015 11/30/2018 0933   PHURINE 6.0 11/30/2018 0933   GLUCOSEU 50 (A) 11/30/2018 0933   HGBUR NEGATIVE 11/30/2018 0933   BILIRUBINUR NEGATIVE 11/30/2018 0933   KETONESUR NEGATIVE 11/30/2018 0933   PROTEINUR >=300 (A) 11/30/2018 0933   NITRITE NEGATIVE 11/30/2018 0933   LEUKOCYTESUR NEGATIVE 11/30/2018 0933   Sepsis Labs: _1 (procalcitonin:4,lacticidven:4) )No results found for this or any previous visit (from the past 240 hour(s)).   Radiological Exams on Admission: Dg Chest Port 1 View  Result Date: 11/30/2018 CLINICAL DATA:  Fever, weakness EXAM: PORTABLE CHEST 1 VIEW COMPARISON:  12/27/2014 FINDINGS:  The heart size and mediastinal contours are within normal limits. Both lungs are clear. The visualized skeletal structures are unremarkable. IMPRESSION: No acute cardiopulmonary findings. Electronically Signed   By: Davina Poke M.D.   On: 11/30/2018 10:48   Dg Foot Complete Right  Result Date: 11/30/2018 CLINICAL DATA:  Chronic open wound on the right foot for 2 years. Fever. EXAM: RIGHT FOOT COMPLETE - 3+ VIEW COMPARISON:  None. FINDINGS: There is no evidence of osteomyelitis or significant arthritis. Calcaneal enthesophytes. Soft  tissue swelling of the forefoot. Soft tissue ulcer noted in the ball of the foot. IMPRESSION: Soft tissue ulcer in the ball of the foot. Otherwise, normal exam. Specifically, no evidence of osteomyelitis. Electronically Signed   By: Lorriane Shire M.D.   On: 11/30/2018 10:51    EKG: Independently reviewed. Sinus, nonspecific T wave changes    Time spent:60 minutes Code Status:   FULL Family Communication:  Daughter at bedside updated 9/24 Disposition Plan: expect 2-3 day hospitalization Consults called: none DVT Prophylaxis: [place] Heparin   Orson Eva, DO  [facility] Pager [phone]  If 7PM-7AM, please contact night-coverage [email or web address] Password [facility] 11/30/2018, 12:34 PM

## 2018-11-30 NOTE — Progress Notes (Signed)
Pharmacy Antibiotic Note  Sabrina Wheeler is a 73 y.o. female admitted on 11/30/2018 with sepsis.  Pharmacy has been consulted for Vancomycin and cefepime dosing.  Plan: Cefepime 2gm IV q24h Vancomycin 1500mg  IV loading dose, then 1gm IV q24h F/U cxs and clinical progress Monitor V/S, labs , and levels as indicated  Height: 5\' 1"  (154.9 cm) Weight: 180 lb 12.4 oz (82 kg) IBW/kg (Calculated) : 47.8  Temp (24hrs), Avg:99 F (37.2 C), Min:98.7 F (37.1 C), Max:99.3 F (37.4 C)  Recent Labs  Lab 11/30/18 1024 11/30/18 1058  WBC 23.2*  --   CREATININE 1.98*  --   LATICACIDVEN  --  1.8    Estimated Creatinine Clearance: 24.2 mL/min (A) (by C-G formula based on SCr of 1.98 mg/dL (H)).    Allergies  Allergen Reactions  . Macrobid [employer] Macro] Other (See Comments)    Pt states "it paralyzed me"  . Doxycycline Nausea And Vomiting  . Clindamycin/Lincomycin Rash    Antimicrobials this admission: Cefepime 9/24 >>  Vancomycin 9/24 >>  Ceftriaxone 1gm IV x 1 in ED 9/24  Dose adjustments this admission: n/a  Microbiology results: 9/24 BCx: pending 9/24 UCX: pending 9/24 SARS 2 CV: pending  MRSA PCR:   Thank you for allowing pharmacy to be a part of this patient's care.  Isac Sarna, BS [place], [place] Clinical Pharmacist Pager [phone] 11/30/2018 12:50 PM

## 2018-11-30 NOTE — ED Notes (Signed)
Pt given dinner tray.

## 2018-11-30 NOTE — ED Notes (Signed)
ED TO INPATIENT HANDOFF REPORT  ED Nurse Name and Phone #: [phone]  S Name/Age/Gender Sabrina Wheeler 73 y.o. female Room/Bed: APA16A/APA16A  Code Status   Code Status: Not on file  Home/SNF/Other Home Patient oriented to: self, place, time and situation Is this baseline? Yes   Triage Complete: Triage complete  Chief Complaint Weakness  Triage Note Per EMS, pt from home c/o generalized weakness, especially in the legs. Family reported a fever of 102, but it was 99 for EMS. Pt not given any Tylenol or Motrin. Pt states she had trouble getting up and walking this morning which is abnormal for her. Daughter reported that pt "was having trouble putting thoughts together," but that has resolved. No recent falls. No unilateral weakness, slurred speech, facial droop. EDP at bedside. CBG 180.   Allergies Allergies  Allergen Reactions  . Macrobid [employer] Macro] Other (See Comments)    Pt states "it paralyzed me"  . Doxycycline Nausea And Vomiting  . Clindamycin/Lincomycin Rash    Level of Care/Admitting Diagnosis ED Disposition    ED Disposition Condition [facility] Area: [facility] [[id number]]  Level of Care: Med-Surg [16]  Covid Evaluation: N/A  Diagnosis: Sepsis due to undetermined organism [facility]) [[id number]]  Admitting Physician: TAT, DAVID [email or web address]  Attending Physician: TAT, DAVID [[id number]]  Estimated length of stay: past midnight tomorrow  Certification:: I certify this patient will need inpatient services for at least 2 midnights  PT Class (Do Not Modify): Inpatient [101]  PT Acc Code (Do Not Modify): Private [1]       B Medical/Surgery History Past Medical History:  Diagnosis Date  . Broken ankle 2008   left   . Diabetes mellitus without complication ([place])   . Hypertension   . Obesity    Past Surgical History:  Procedure Laterality Date  . INCISIONAL HERNIA REPAIR       A IV Location/Drains/Wounds Patient  Lines/Drains/Airways Status   Active Line/Drains/Airways    Name:   Placement date:   Placement time:   Site:   Days:   Peripheral IV 11/30/18 Right Antecubital   11/30/18    1010    Antecubital   less than 1          Intake/Output Last 24 hours  Intake/Output Summary (Last 24 hours) at 11/30/2018 1427 Last data filed at 11/30/2018 1254 [place] per 24 hour  Intake 200 ml  Output -  Net 200 ml    Labs/Imaging Results for orders placed or performed during the hospital encounter of 11/30/18 (from the past 48 hour(s))  Urinalysis, Routine w reflex microscopic     Status: Abnormal   Collection Time: 11/30/18  9:33 AM  Result Value Ref Range   Color, Urine YELLOW YELLOW   APPearance CLEAR CLEAR   Specific Gravity, Urine 1.015 1.005 - 1.030   pH 6.0 5.0 - 8.0   Glucose, UA 50 (A) NEGATIVE mg/dL   Hgb urine dipstick NEGATIVE NEGATIVE   Bilirubin Urine NEGATIVE NEGATIVE   Ketones, ur NEGATIVE NEGATIVE mg/dL   Protein, ur >=300 (A) NEGATIVE mg/dL   Nitrite NEGATIVE NEGATIVE   Leukocytes,Ua NEGATIVE NEGATIVE   RBC / HPF 0-5 0 - 5 RBC/hpf   WBC, UA 0-5 0 - 5 WBC/hpf   Bacteria, UA RARE (A) NONE SEEN   Squamous Epithelial / LPF 0-5 0 - 5   Mucus PRESENT     Comment: Performed at [facility], [address] [place], [place] [address]  Basic metabolic panel     Status: Abnormal   Collection Time: 11/30/18 10:24 AM  Result Value Ref Range   Sodium 138 135 - 145 mmol/L   Potassium 3.2 (L) 3.5 - 5.1 mmol/L   Chloride 105 98 - 111 mmol/L   CO2 24 22 - 32 mmol/L   Glucose, Bld 199 (H) 70 - 99 mg/dL   BUN 28 (H) 8 - 23 mg/dL   Creatinine, Ser 1.98 (H) 0.44 - 1.00 mg/dL   Calcium 7.8 (L) 8.9 - 10.3 mg/dL   GFR calc non Af Amer 24 (L) >60 mL/min   GFR calc Af Amer 28 (L) >60 mL/min   Anion gap 9 5 - 15    Comment: Performed at [facility], [address] [place], [place] [address]  CBC with Differential     Status: Abnormal   Collection Time: 11/30/18 10:24 AM  Result Value Ref Range    WBC 23.2 (H) 4.0 - 10.5 K/uL   RBC 3.60 (L) 3.87 - 5.11 MIL/uL   Hemoglobin 10.5 (L) 12.0 - 15.0 g/dL   HCT 32.0 (L) 36.0 - 46.0 %   MCV 88.9 80.0 - 100.0 fL   MCH 29.2 26.0 - 34.0 pg   MCHC 32.8 30.0 - 36.0 g/dL   RDW 14.9 11.5 - 15.5 %   Platelets 222 150 - 400 K/uL   nRBC 0.0 0.0 - 0.2 %   Neutrophils Relative % 88 %   Neutro Abs 20.3 (H) 1.7 - 7.7 K/uL   Lymphocytes Relative 4 %   Lymphs Abs 1.0 0.7 - 4.0 K/uL   Monocytes Relative 7 %   Monocytes Absolute 1.6 (H) 0.1 - 1.0 K/uL   Eosinophils Relative 0 %   Eosinophils Absolute 0.0 0.0 - 0.5 K/uL   Basophils Relative 0 %   Basophils Absolute 0.1 0.0 - 0.1 K/uL   Immature Granulocytes 1 %   Abs Immature Granulocytes 0.22 (H) 0.00 - 0.07 K/uL    Comment: Performed at [facility], [address], [place], [place] [address]  Lactic acid, plasma     Status: None   Collection Time: 11/30/18 10:58 AM  Result Value Ref Range   Lactic Acid, Venous 1.8 0.5 - 1.9 mmol/L    Comment: Performed at [facility], [address] [place],  [address]   Dg Chest Port 1 View  Result Date: 11/30/2018 CLINICAL DATA:  Fever, weakness EXAM: PORTABLE CHEST 1 VIEW COMPARISON:  12/27/2014 FINDINGS: The heart size and mediastinal contours are within normal limits. Both lungs are clear. The visualized skeletal structures are unremarkable. IMPRESSION: No acute cardiopulmonary findings. Electronically Signed   By: Davina Poke M.D.   On: 11/30/2018 10:48   Dg Foot Complete Right  Result Date: 11/30/2018 CLINICAL DATA:  Chronic open wound on the right foot for 2 years. Fever. EXAM: RIGHT FOOT COMPLETE - 3+ VIEW COMPARISON:  None. FINDINGS: There is no evidence of osteomyelitis or significant arthritis. Calcaneal enthesophytes. Soft tissue swelling of the forefoot. Soft tissue ulcer noted in the ball of the foot. IMPRESSION: Soft tissue ulcer in the ball of the foot. Otherwise, normal exam. Specifically, no evidence of osteomyelitis. Electronically  Signed   By: Lorriane Shire M.D.   On: 11/30/2018 10:51    Pending Labs Unresulted Labs (From admission, onward)    Start     Ordered   11/30/18 1202  SARS CORONAVIRUS 2 (TAT 6-24 HRS) Nasopharyngeal Nasopharyngeal Swab  (Asymptomatic/Tier 2 Patients Labs)  Once,  STAT    Question Answer Comment  Is this test for diagnosis or screening Screening   Symptomatic for COVID-19 as defined by CDC No   Hospitalized for COVID-19 No   Admitted to ICU for COVID-19 No   Previously tested for COVID-19 No   Resident in a congregate (group) care setting No   Employed in healthcare setting No   Pregnant No      11/30/18 1201   11/30/18 1049  Blood culture (routine x 2)  BLOOD CULTURE X 2,   STAT     11/30/18 1048   11/30/18 0933  Urine culture  ONCE - STAT,   STAT     11/30/18 0932   Signed and Held  Hemoglobin A1c  Once,   R     Signed and Held   Signed and Held  TSH  Once,   R     Signed and Held   Signed and Held  Comprehensive metabolic panel  Tomorrow morning,   R     Signed and Held   Visual merchandiser and Held  Hepatic function panel  Once,   R     Signed and Held   Signed and Held  CK  Once,   R     Signed and Held   Signed and Held  T4, free  Once,   R     Signed and Held   Signed and Held  Vitamin B12  Once,   R     Signed and Held   Visual merchandiser and Held  Folate  Once,   R     Signed and Held   Signed and Held  Protime-INR  Once,   R     Signed and Held   Signed and Held  Lactic acid, plasma  Once,   R     Signed and Held   Signed and Held  APTT  Once,   R     Signed and Held   Signed and Held  Procalcitonin - Baseline  ONCE - STAT,   STAT     Signed and Held   Signed and Held  Sedimentation rate  Once,   R     Signed and Held   Signed and Held  C-reactive protein  Once,   R     Signed and Held   Signed and Held  CBC  Tomorrow morning,   R    Question:  Specimen collection method  Answer:  Lab=Lab collect   Signed and Held   Signed and Held  Magnesium  Tomorrow morning,   R     Question:  Specimen collection method  Answer:  Lab=Lab collect   Signed and Held          Vitals/Pain Today's Vitals   11/30/18 1230 11/30/18 1300 11/30/18 1330 11/30/18 1400  BP: (!) 153/54 (!) 152/58 (!) 140/55 133/64  Pulse: 76 71 70 71  Resp: 18 (!) 22 17 18   Temp:      TempSrc:      SpO2: 99% 100% 100% 99%  Weight:      Height:      PainSc:        Isolation Precautions No active isolations  Medications Medications  0.9 %  sodium chloride infusion ( Intravenous New Bag/Given 11/30/18 1021)  acetaminophen (TYLENOL) tablet 650 mg (650 mg Oral Refused 11/30/18 1023)  ceFEPIme (MAXIPIME) 2 g in sodium chloride 0.9 % 100 mL IVPB (2 g Intravenous New Bag/Given 11/30/18 1300)  vancomycin (VANCOCIN) 1,500 mg in sodium chloride 0.9 % 500 mL IVPB (1,500 mg Intravenous New Bag/Given 11/30/18 1338)    Followed by  vancomycin (VANCOCIN) IVPB 1000 mg/200 mL premix (has no administration in time range)  influenza vaccine adjuvanted (FLUAD) injection 0.5 mL (has no administration in time range)  cefTRIAXone (ROCEPHIN) 1 g in sodium chloride 0.9 % 100 mL IVPB (0 g Intravenous Stopped 11/30/18 1218)  potassium chloride 10 mEq in 100 mL IVPB (0 mEq Intravenous Stopped 11/30/18 1254)    Mobility walks Moderate fall risk   Focused Assessments    R Recommendations: See Admitting Provider Note  Report given to:   Additional Notes:

## 2018-11-30 NOTE — ED Triage Notes (Signed)
Per EMS, pt from home c/o generalized weakness, especially in the legs. Family reported a fever of 102, but it was 99 for EMS. Pt not given any Tylenol or Motrin. Pt states she had trouble getting up and walking this morning which is abnormal for her. Daughter reported that pt "was having trouble putting thoughts together," but that has resolved. No recent falls. No unilateral weakness, slurred speech, facial droop. EDP at bedside. CBG 180.

## 2018-12-01 ENCOUNTER — Inpatient Hospital Stay (HOSPITAL_COMMUNITY): Payer: Medicare Other

## 2018-12-01 LAB — CBC
HCT: 32.4 % — ABNORMAL LOW (ref 36.0–46.0)
Hemoglobin: 10.3 g/dL — ABNORMAL LOW (ref 12.0–15.0)
MCH: 28.9 pg (ref 26.0–34.0)
MCHC: 31.8 g/dL (ref 30.0–36.0)
MCV: 91 fL (ref 80.0–100.0)
Platelets: 208 10*3/uL (ref 150–400)
RBC: 3.56 MIL/uL — ABNORMAL LOW (ref 3.87–5.11)
RDW: 15 % (ref 11.5–15.5)
WBC: 10.9 10*3/uL — ABNORMAL HIGH (ref 4.0–10.5)
nRBC: 0 % (ref 0.0–0.2)

## 2018-12-01 LAB — COMPREHENSIVE METABOLIC PANEL
ALT: 18 U/L (ref 0–44)
AST: 19 U/L (ref 15–41)
Albumin: 2.7 g/dL — ABNORMAL LOW (ref 3.5–5.0)
Alkaline Phosphatase: 55 U/L (ref 38–126)
Anion gap: 8 (ref 5–15)
BUN: 25 mg/dL — ABNORMAL HIGH (ref 8–23)
CO2: 23 mmol/L (ref 22–32)
Calcium: 7.9 mg/dL — ABNORMAL LOW (ref 8.9–10.3)
Chloride: 109 mmol/L (ref 98–111)
Creatinine, Ser: 1.95 mg/dL — ABNORMAL HIGH (ref 0.44–1.00)
GFR calc Af Amer: 29 mL/min — ABNORMAL LOW (ref >60)
GFR calc non Af Amer: 25 mL/min — ABNORMAL LOW (ref >60)
Glucose, Bld: 130 mg/dL — ABNORMAL HIGH (ref 70–99)
Potassium: 3.2 mmol/L — ABNORMAL LOW (ref 3.5–5.1)
Sodium: 140 mmol/L (ref 135–145)
Total Bilirubin: 0.5 mg/dL (ref 0.3–1.2)
Total Protein: 5.7 g/dL — ABNORMAL LOW (ref 6.5–8.1)

## 2018-12-01 LAB — URINE CULTURE: Culture: NO GROWTH

## 2018-12-01 LAB — HEMOGLOBIN A1C
Hgb A1c MFr Bld: 7.2 % — ABNORMAL HIGH (ref 4.8–5.6)
Mean Plasma Glucose: 159.94 mg/dL

## 2018-12-01 LAB — MAGNESIUM: Magnesium: 1.7 mg/dL (ref 1.7–2.4)

## 2018-12-01 LAB — T4, FREE: Free T4: 0.98 ng/dL (ref 0.61–1.12)

## 2018-12-01 MED ORDER — MAGNESIUM SULFATE 2 GM/50ML IV SOLN
2.0000 g | Freq: Once | INTRAVENOUS | Status: AC
  Administered 2018-12-01: 2 g via INTRAVENOUS
  Filled 2018-12-01: qty 50

## 2018-12-01 MED ORDER — POTASSIUM CHLORIDE CRYS ER 20 MEQ PO TBCR
20.0000 meq | EXTENDED_RELEASE_TABLET | Freq: Once | ORAL | Status: AC
  Administered 2018-12-01: 20 meq via ORAL
  Filled 2018-12-01: qty 1

## 2018-12-01 MED ORDER — POTASSIUM CHLORIDE IN NACL 20-0.9 MEQ/L-% IV SOLN
INTRAVENOUS | Status: AC
  Administered 2018-12-01: 10:00:00 via INTRAVENOUS

## 2018-12-01 NOTE — Care Management Important Message (Signed)
Important Message  Patient Details  Name: Sabrina Wheeler MRN: [id number] Date of Birth: 1945-03-19   Medicare Important Message Given:  Yes     Tommy Medal 12/01/2018, 1:49 PM

## 2018-12-01 NOTE — Progress Notes (Addendum)
PROGRESS NOTE  Sabrina Wheeler RWE:[id number] DOB: 1945/06/30 DOA: 11/30/2018 PCP: Wyatt Haste, NP  Brief History:  73 y.o. female with medical history of hypertension, diabetes mellitus type 2 not on any agents, hyperlipidemia, peripheral neuropathy presenting with generalized weakness, fever, and confusion that began in the early morning 11/30/2018.  The patient had been in her usual state of health ambulating with assistance of a cane up until the morning of 11/30/2018 at which time, the patient had generalized weakness with difficulty transferring from the bed to the commode.  She required assistance of her daughter to get onto her feet.  Daughter stated that the patient had bilateral leg weakness to the point where she was unable to bear weight.  Apparently, the patient's daughter took the patient's temperature with a forehead scanner and it registered 102.0 F.  In addition, the patient's daughter has noted that the patient has been increasingly confused for the past 3 to 4 days.  EMS was activated.  Upon arrival, the patient was noted to have an extra 99.1 F.  Daughter stated by the time the EMS arrived, the patient was a bit more lucid and stronger to the point where she was able to bear weight on her legs and transfer to the gurney.  There is been no new medications for the past month.   In the emergency department, the patient had a temperature 9 9.1 F.  She was hemodynamically stable with oxygen saturation 100% on room air.  BMP showed a potassium 3.2 with serum creatinine 1.98 which is around her baseline.  WBC was 23.2 with hemoglobin 10.5 which is around her baseline her lactic acid was 1.8.  Chest x-ray was negative for any acute infiltrates.  Urinalysis was negative for pyuria.  The patient was admitted for further evaluation of possible sepsis.  She was started on IV fluids and empiric IV abx  Assessment/Plan: Sepsis -Presented with fever and elevated WBC -Source likely  skin/soft tissue infection -Lactic acid 1.8>>>1.0 -Check procalcitonin 0.33 -Continue vancomycin and cefepime empirically -Follow blood culture -personally reviewed Chest x-ray negative for infiltrates or edema  Diabetic foot ulcer/Cellulitis Leg -Concerned about cellulitis of the lower extremities -Check ESR--42 -Check CRP--13.4 -MRI right foot--results pending -see pictures below  Diabetes mellitus type 2 -Hemoglobin A1c--pending -NovoLog sliding scale  Hyperlipidemia -Continue statin  Essential hypertension -Holding amlodipine -Restart carvedilol and lisinopril  CKD stage III -Baseline creatinine 1.9-2.2  Hypokalemia -replete -check mag--1.7 -start IVF with KCl -am BMP  Leg Edema/Pain -venous duplex      Disposition Plan:   Home in 1-2 days  Family Communication:   Daughter updated on phone 9/25  Consultants:  none  Code Status:  FULL   DVT Prophylaxis:  Brave Heparin    Procedures: As Listed in Progress Note Above  Antibiotics: vanco 9/24>>> Cefepime 9/24>>>       Subjective: Patient denies fevers, chills, headache, chest pain, dyspnea, nausea, vomiting, diarrhea, abdominal pain, dysuria, hematuria, hematochezia, and melena.   Objective: Vitals:   11/30/18 1805 11/30/18 1932 11/30/18 2147 12/01/18 0537  BP:  (!) 160/53 (!) 140/98 (!) 147/49  Pulse: 83 74 77 (!) 58  Resp: '16 16 18 16  '$ Temp:  98.7 F (37.1 C) 99.4 F (37.4 C) 98.2 F (36.8 C)  TempSrc:  Oral Oral Oral  SpO2: 97% 97% 94% 99%  Weight:  81.6 kg    Height:  '5\' 5"'$  (1.651 m)  Intake/Output Summary (Last 24 hours) at 12/01/2018 0918 Last data filed at 12/01/2018 0700 Gross per 24 hour  Intake 2299.92 ml  Output 400 ml  Net 1899.92 ml   Weight change:  Exam:   General:  Pt is alert, follows commands appropriately, not in acute distress  HEENT: No icterus, No thrush, No neck mass, [place]/AT  Cardiovascular: RRR, S1/S2, no rubs, no gallops  Respiratory: CTA  bilaterally, no wheezing, no crackles, no rhonchi  Abdomen: Soft/+BS, non tender, non distended, no guarding  Extremities: 1 + LE edema, No lymphangitis, No petechiae, No rashes, no synovitis     Data Reviewed: I have personally reviewed following labs and imaging studies Basic Metabolic Panel: Recent Labs  Lab 11/30/18 1024 12/01/18 0702  NA 138 140  K 3.2* 3.2*  CL 105 109  CO2 24 23  GLUCOSE 199* 130*  BUN 28* 25*  CREATININE 1.98* 1.95*  CALCIUM 7.8* 7.9*  MG  --  1.7   Liver Function Tests: Recent Labs  Lab 11/30/18 1956 12/01/18 0702  AST 20 19  ALT 19 18  ALKPHOS 62 55  BILITOT 0.7 0.5  PROT 5.7* 5.7*  ALBUMIN 2.8* 2.7*   No results for input(s): LIPASE, AMYLASE in the last 168 hours. No results for input(s): AMMONIA in the last 168 hours. Coagulation Profile: Recent Labs  Lab 11/30/18 1956  INR 1.1   CBC: Recent Labs  Lab 11/30/18 1024 12/01/18 0702  WBC 23.2* 10.9*  NEUTROABS 20.3*  --   HGB 10.5* 10.3*  HCT 32.0* 32.4*  MCV 88.9 91.0  PLT 222 208   Cardiac Enzymes: Recent Labs  Lab 11/30/18 1956  CKTOTAL 63   BNP: Invalid input(s): POCBNP CBG: No results for input(s): GLUCAP in the last 168 hours. HbA1C: No results for input(s): HGBA1C in the last 72 hours. Urine analysis:    Component Value Date/Time   COLORURINE YELLOW 11/30/2018 0933   APPEARANCEUR CLEAR 11/30/2018 0933   LABSPEC 1.015 11/30/2018 0933   PHURINE 6.0 11/30/2018 0933   GLUCOSEU 50 (A) 11/30/2018 0933   HGBUR NEGATIVE 11/30/2018 0933   BILIRUBINUR NEGATIVE 11/30/2018 0933   KETONESUR NEGATIVE 11/30/2018 0933   PROTEINUR >=300 (A) 11/30/2018 0933   NITRITE NEGATIVE 11/30/2018 0933   LEUKOCYTESUR NEGATIVE 11/30/2018 0933   Sepsis Labs: '@LABRCNTIP'$ (procalcitonin:4,lacticidven:4) ) Recent Results (from the past 240 hour(s))  Blood culture (routine x 2)     Status: None (Preliminary result)   Collection Time: 11/30/18 10:58 AM   Specimen: BLOOD  Result  Value Ref Range Status   Specimen Description BLOOD  Final   Special Requests   Final    BOTTLES DRAWN AEROBIC AND ANAEROBIC Blood Culture adequate volume   Culture   Final    NO GROWTH < 24 HOURS Performed at [facility], [address], [place], [place] [address]    Report Status PENDING  Incomplete  Blood culture (routine x 2)     Status: None (Preliminary result)   Collection Time: 11/30/18 11:04 AM   Specimen: BLOOD  Result Value Ref Range Status   Specimen Description BLOOD  Final   Special Requests   Final    BOTTLES DRAWN AEROBIC ONLY Blood Culture results may not be optimal due to an inadequate volume of blood received in culture bottles   Culture   Final    NO GROWTH < 24 HOURS Performed at [facility], [address], [place], [place] [address]    Report Status PENDING  Incomplete  SARS  CORONAVIRUS 2 (Quavis Klutz 6-24 HRS) Nasopharyngeal Nasopharyngeal Swab     Status: None   Collection Time: 11/30/18 12:02 PM   Specimen: Nasopharyngeal Swab  Result Value Ref Range Status   SARS Coronavirus 2 NEGATIVE NEGATIVE Final    Comment: (NOTE) SARS-CoV-2 target nucleic acids are NOT DETECTED. The SARS-CoV-2 RNA is generally detectable in upper and lower respiratory specimens during the acute phase of infection. Negative results do not preclude SARS-CoV-2 infection, do not rule out co-infections with other pathogens, and should not be used as the sole basis for treatment or other patient management decisions. Negative results must be combined with clinical observations, patient history, and epidemiological information. The expected result is Negative. Fact Sheet for Patients: SugarRoll.be Fact Sheet for Healthcare Providers: https://www.woods-mathews.com/ This test is not yet approved or cleared by the [place] FDA and  has been authorized for detection and/or diagnosis of SARS-CoV-2 by FDA under an Emergency Use Authorization (EUA).  This EUA will remain  in effect (meaning this test can be used) for the duration of the COVID-19 declaration under Section 56 4(b)(1) of the Act, 21 U.S.C. section 360bbb-3(b)(1), unless the authorization is terminated or revoked sooner. Performed at [facility], [place] [address], [place], [place] [address]      Scheduled Meds: . acetaminophen  650 mg Oral Once  . aspirin EC  81 mg Oral Daily  . atorvastatin  80 mg Oral q1800  . carvedilol  6.25 mg Oral BID WC  . cholecalciferol  1,000 Units Oral QHS  . fluticasone  1 spray Each Nare QHS  . folic acid  1 mg Oral Daily  . gabapentin  200 mg Oral BID  . heparin  5,000 Units Subcutaneous Q8H  . influenza vaccine adjuvanted  0.5 mL Intramuscular Tomorrow-1000  . iron polysaccharides  150 mg Oral Daily  . lisinopril  5 mg Oral Daily  . loratadine  10 mg Oral Daily  . multivitamin with minerals  1 tablet Oral Daily  . pantoprazole  40 mg Oral Daily  . sodium bicarbonate  650 mg Oral BID   Continuous Infusions: . sodium chloride Stopped (11/30/18 1448)  . 0.9 % NaCl with KCl 20 mEq / L 75 mL/hr at 11/30/18 2340  . ceFEPime (MAXIPIME) IV 2 g (11/30/18 1300)  . vancomycin      Procedures/Studies: Dg Chest Port 1 View  Result Date: 11/30/2018 CLINICAL DATA:  Fever, weakness EXAM: PORTABLE CHEST 1 VIEW COMPARISON:  12/27/2014 FINDINGS: The heart size and mediastinal contours are within normal limits. Both lungs are clear. The visualized skeletal structures are unremarkable. IMPRESSION: No acute cardiopulmonary findings. Electronically Signed   By: Davina Poke M.D.   On: 11/30/2018 10:48   Dg Foot Complete Right  Result Date: 11/30/2018 CLINICAL DATA:  Chronic open wound on the right foot for 2 years. Fever. EXAM: RIGHT FOOT COMPLETE - 3+ VIEW COMPARISON:  None. FINDINGS: There is no evidence of osteomyelitis or significant arthritis. Calcaneal enthesophytes. Soft tissue swelling of the forefoot. Soft tissue ulcer noted in  the ball of the foot. IMPRESSION: Soft tissue ulcer in the ball of the foot. Otherwise, normal exam. Specifically, no evidence of osteomyelitis. Electronically Signed   By: Lorriane Shire M.D.   On: 11/30/2018 10:51    Orson Eva, DO  [facility] Pager [phone]  If 7PM-7AM, please contact night-coverage [email or web address] Password [email or web address] 12/01/2018, 9:18 AM   LOS: 1 day

## 2018-12-02 LAB — BASIC METABOLIC PANEL
Anion gap: 7 (ref 5–15)
BUN: 25 mg/dL — ABNORMAL HIGH (ref 8–23)
CO2: 24 mmol/L (ref 22–32)
Calcium: 8.2 mg/dL — ABNORMAL LOW (ref 8.9–10.3)
Chloride: 110 mmol/L (ref 98–111)
Creatinine, Ser: 1.79 mg/dL — ABNORMAL HIGH (ref 0.44–1.00)
GFR calc Af Amer: 32 mL/min — ABNORMAL LOW (ref >60)
GFR calc non Af Amer: 27 mL/min — ABNORMAL LOW (ref >60)
Glucose, Bld: 158 mg/dL — ABNORMAL HIGH (ref 70–99)
Potassium: 3.9 mmol/L (ref 3.5–5.1)
Sodium: 141 mmol/L (ref 135–145)

## 2018-12-02 LAB — CBC
HCT: 32.8 % — ABNORMAL LOW (ref 36.0–46.0)
Hemoglobin: 10.3 g/dL — ABNORMAL LOW (ref 12.0–15.0)
MCH: 28.7 pg (ref 26.0–34.0)
MCHC: 31.4 g/dL (ref 30.0–36.0)
MCV: 91.4 fL (ref 80.0–100.0)
Platelets: 211 10*3/uL (ref 150–400)
RBC: 3.59 MIL/uL — ABNORMAL LOW (ref 3.87–5.11)
RDW: 14.9 % (ref 11.5–15.5)
WBC: 8.1 10*3/uL (ref 4.0–10.5)
nRBC: 0 % (ref 0.0–0.2)

## 2018-12-02 LAB — MAGNESIUM: Magnesium: 2.2 mg/dL (ref 1.7–2.4)

## 2018-12-02 MED ORDER — AMOXICILLIN-POT CLAVULANATE 500-125 MG PO TABS
1.0000 | ORAL_TABLET | Freq: Two times a day (BID) | ORAL | Status: DC
  Administered 2018-12-02: 500 mg via ORAL
  Filled 2018-12-02: qty 1

## 2018-12-02 MED ORDER — AMOXICILLIN-POT CLAVULANATE 500-125 MG PO TABS: 1.0000 | ORAL_TABLET | Freq: Two times a day (BID) | ORAL | 0 refills | Status: DC

## 2018-12-02 MED ORDER — SULFAMETHOXAZOLE-TRIMETHOPRIM 800-160 MG PO TABS: 1.0000 | ORAL_TABLET | Freq: Every day | ORAL | 0 refills | Status: DC

## 2018-12-02 MED ORDER — AMOXICILLIN-POT CLAVULANATE 875-125 MG PO TABS: 1.0000 | ORAL_TABLET | Freq: Two times a day (BID) | ORAL | Status: DC

## 2018-12-02 MED ORDER — AMLODIPINE BESYLATE 5 MG PO TABS
5.0000 mg | ORAL_TABLET | Freq: Every day | ORAL | Status: DC
  Administered 2018-12-02: 5 mg via ORAL
  Filled 2018-12-02: qty 1

## 2018-12-02 NOTE — Progress Notes (Signed)
IV removed from left wrist, 2x2 gauze and paper tape applied to site, patient tolerated well. Reviewed AVS with patient and patient's daughter, Pasty Spillers, both verbalized understanding. Patient taken to lobby via wheelchair and transported home by her daughter.

## 2018-12-02 NOTE — Discharge Summary (Signed)
Physician Discharge Summary  Sabrina Wheeler XJD:[id number] DOB: 04/18/1945 DOA: 11/30/2018  PCP: Sabrina Haste, NP  Admit date: 11/30/2018 Discharge date: 12/02/2018  Admitted From:Home Disposition:  Home   Recommendations for Outpatient Follow-up:  1. Follow up with PCP in 1-2 weeks 2. Please obtain BMP/CBC in one week 3.    Discharge Condition: Stable CODE STATUS: FULL Diet recommendation: Heart Healthy    Brief/Interim Summary: 73 y.o.femalewith medical history ofhypertension, diabetes mellitus type 2 not on any agents, hyperlipidemia, peripheral neuropathy presenting with generalized weakness, fever, and confusion that began in the early morning 11/30/2018. The patient had been in her usual state of health ambulating with assistance of a cane up until the morning of 11/30/2018 at which time, the patient had generalized weakness with difficulty transferring from the bed to the commode. She required assistance of her daughter to get onto her feet. Daughter stated that the patient had bilateral leg weakness to the point where she was unable to bear weight. Apparently, the patient's daughter took the patient's temperature with a forehead scanner and it registered 102.0 F. In addition, the patient's daughter has noted that the patient has been increasingly confused for the past 3 to 4 days. EMS was activated. Upon arrival, the patient was noted to have an extra 99.1 F. Daughter stated by the time the EMS arrived, the patient was a bit more lucid and stronger to the point where she was able to bear weight on her legs and transfer to the gurney. There is been no new medications for the past month.   In the emergency department, the patient had a temperature 9 9.1 F. She was hemodynamically stable with oxygen saturation 100% on room air. BMP showed a potassium 3.2 with serum creatinine 1.98 which is around her baseline. WBC was 23.2 with hemoglobin 10.5 which is around her  baseline her lactic acid was 1.8. Chest x-ray was negative for any acute infiltrates. Urinalysis was negative for pyuria. The patient was admitted for further evaluation of possible sepsis.She was started on IV fluids and empiric IV abx   Discharge Diagnoses:  Sepsis -Presented with fever and elevated WBC -Source likely skin/soft tissue infection -Lactic acid 1.8>>>1.0 -Check procalcitonin 0.33 -Continue vancomycin and cefepime empirically>>>d/c home with tmp/smz DS and amox/clav x 1 week -Follow blood culture--neg -personally reviewed Chest x-ray negative for infiltrates or edema  Diabetic foot ulcer/Cellulitis Leg -although she has baseline "pink/red" legs, much improved with abx with improved swelling on abx -Check ESR--42 -Check CRP--13.4 -MRI right foot--neg for osteomyelitis, neg for abscess -d/c home with tmp/smz DS and amox/clav x 1 week  Diabetes mellitus type 2 -Hemoglobin A1c--7/2 -NovoLog sliding scale  Hyperlipidemia -Continue statin  Essential hypertension -Holding amlodipine>>>restart as BP going back up -Restart carvedilol and lisinopril  CKD stage III -Baseline creatinine 1.9-2.2 -serum creatinine 1.79 on day of d/c  Hypokalemia -replete -check mag--2.2 -start IVF with KCl  Leg Edema/Pain -venous duplex--neg for acute DVT; 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   amoxicillin-clavulanate 500-125 MG tablet Commonly known as: AUGMENTIN Take 1 tablet (500 mg total) by mouth 2 (two) times  daily.   aspirin EC 81 MG tablet Take 81 mg by mouth daily.   atorvastatin 80 MG tablet Commonly known as: LIPITOR Take 1 tablet by mouth daily.   Calcium 600+D 600-800 MG-UNIT Tabs Generic drug: Calcium Carb-Cholecalciferol Take 1 tablet by mouth at bedtime.   carvedilol 6.25 MG tablet Commonly known as: COREG Take 6.25 mg by mouth 2 (two) times daily with a meal.   Cholecalciferol 25 MCG (1000 UT) capsule Take 1,000 Units by mouth at bedtime.   Chromium-Cinnamon 505-180-0665 MCG-MG Caps Take 1 capsule by mouth daily.   Cranberry Plus Vitamin C 4200-20-3 MG-MG-UNIT Caps Generic drug: Cranberry-Vitamin C-Vitamin E Take 1 capsule by mouth daily.   fexofenadine 180 MG tablet Commonly known as: ALLEGRA Take 180 mg by mouth daily.   fluticasone 50 MCG/ACT nasal spray Commonly known as: FLONASE Place 1 spray into both nostrils at bedtime.   gabapentin 100 MG capsule Commonly known as: NEURONTIN Take 200 mg by mouth 2 (two) times daily.   LECITHIN PO Take 1 capsule by mouth daily. 1328m   multivitamin with minerals tablet Take 1 tablet by mouth daily.   OSTEO BI-FLEX REGULAR STRENGTH PO Take 1 capsule by mouth 2 (two) times daily.   OVER THE COUNTER MEDICATION Take 1 tablet by mouth daily. Osteo-Bi Flex with Turmeric   pantoprazole 40 MG tablet Commonly known as: PROTONIX Take 40 mg by mouth daily.   Red Yeast Rice 600 MG Tabs Take 600 mg by mouth 2 (two) times daily.   sulfamethoxazole-trimethoprim 800-160 MG tablet Commonly known as: Bactrim DS Take 1 tablet by mouth daily.   torsemide 20 MG tablet Commonly known as: DEMADEX Take 20 mg by mouth daily.   TURMERIC PO Take by mouth daily.       Allergies  Allergen Reactions  . Macrobid [WPS ResourcesMacro] Other (See Comments)    Pt states "it paralyzed me"  . Doxycycline Nausea And Vomiting  . Clindamycin/Lincomycin Rash    Consultations:  none   Procedures/Studies: Mr Foot Right  Wo Contrast  Result Date: 12/01/2018 CLINICAL DATA:  Chronic skin ulceration on the plantar surface of the right foot in a diabetic patient. EXAM: MRI OF THE RIGHT FOREFOOT WITHOUT CONTRAST TECHNIQUE: Multiplanar, multisequence MR imaging of the right forefoot was performed. No intravenous contrast was administered. COMPARISON:  Plain films right foot 11/30/2018. FINDINGS: Bones/Joint/Cartilage Bone marrow signal is normal without evidence of osteomyelitis, fracture, stress change or worrisome lesion. Ligaments Intact. Muscles and Tendons Intact.  No intramuscular fluid collection. Soft tissues Subcutaneous edema is present over the dorsum of the foot. No focal fluid collection is identified. Markers placed on the plantar surface of the third toe IMPRESSION: Subcutaneous edema over the dorsum of the foot could be due to dependent change or cellulitis. Negative for abscess, osteomyelitis or septic joint. Electronically Signed   By: TInge RiseM.D.   On: 12/01/2018 09:13   [place]Venous Img Lower Bilateral  Result Date: 12/01/2018 CLINICAL DATA:  Bilateral lower extremity edema.  Evaluate for DVT. EXAM: BILATERAL LOWER EXTREMITY VENOUS DOPPLER ULTRASOUND TECHNIQUE: Gray-scale sonography with graded compression, as well as color Doppler and duplex ultrasound were performed to evaluate the lower extremity deep venous systems from the level of the common femoral vein and including the common femoral, femoral, profunda femoral, popliteal and calf veins including the posterior tibial,  peroneal and gastrocnemius veins when visible. The superficial great saphenous vein was also interrogated. Spectral Doppler was utilized to evaluate flow at rest and with distal augmentation maneuvers in the common femoral, femoral and popliteal veins. COMPARISON:  None. FINDINGS: RIGHT LOWER EXTREMITY Common Femoral Vein: No evidence of thrombus. Normal compressibility, respiratory phasicity and response to augmentation.  Saphenofemoral Junction: No evidence of thrombus. Normal compressibility and flow on color Doppler imaging. Profunda Femoral Vein: No evidence of thrombus. Normal compressibility and flow on color Doppler imaging. Femoral Vein: No evidence of thrombus. Normal compressibility, respiratory phasicity and response to augmentation. Popliteal Vein: No evidence of thrombus. Normal compressibility, respiratory phasicity and response to augmentation. Calf Veins: No evidence of thrombus. Normal compressibility and flow on color Doppler imaging. Superficial Great Saphenous Vein: No evidence of thrombus. Normal compressibility. Venous Reflux:  None. Other Findings: There is hypoechoic occlusive thrombus within the right lesser saphenous vein (image 38 and 39. LEFT LOWER EXTREMITY Common Femoral Vein: No evidence of thrombus. Normal compressibility, respiratory phasicity and response to augmentation. Saphenofemoral Junction: No evidence of thrombus. Normal compressibility and flow on color Doppler imaging. Profunda Femoral Vein: No evidence of thrombus. Normal compressibility and flow on color Doppler imaging. Femoral Vein: There is mixed echogenic nonocclusive wall thickening/chronic DVT involving the mid (image 71) aspect of the left femoral vein). The proximal and distal aspects of the femoral vein appear patent where imaged. Popliteal Vein: No evidence of thrombus. Normal compressibility, respiratory phasicity and response to augmentation. Calf Veins: No evidence of thrombus. Normal compressibility and flow on color Doppler imaging. Superficial Great Saphenous Vein: No evidence of thrombus. Normal compressibility. Venous Reflux:  None. Other Findings:  None. IMPRESSION: 1. No evidence of acute DVT within either lower extremity. 2. Age-indeterminate, though potentially acute, occlusive superficial thrombophlebitis involving the right lesser saphenous vein. 3. 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 Electronically Signed   By: Sandi Mariscal M.D.   On: 12/01/2018 12:03   Dg Chest Port 1 View  Result Date: 11/30/2018 CLINICAL DATA:  Fever, weakness EXAM: PORTABLE CHEST 1 VIEW COMPARISON:  12/27/2014 FINDINGS: The heart size and mediastinal contours are within normal limits. Both lungs are clear. The visualized skeletal structures are unremarkable. IMPRESSION: No acute cardiopulmonary findings. Electronically Signed   By: Davina Poke M.D.   On: 11/30/2018 10:48   Dg Foot Complete Right  Result Date: 11/30/2018 CLINICAL DATA:  Chronic open wound on the right foot for 2 years. Fever. EXAM: RIGHT FOOT COMPLETE - 3+ VIEW COMPARISON:  None. FINDINGS: There is no evidence of osteomyelitis or significant arthritis. Calcaneal enthesophytes. Soft tissue swelling of the forefoot. Soft tissue ulcer noted in the ball of the foot. IMPRESSION: Soft tissue ulcer in the ball of the foot. Otherwise, normal exam. Specifically, no evidence of osteomyelitis. Electronically Signed   By: Lorriane Shire M.D.   On: 11/30/2018 10:51         Discharge Exam: Vitals:   12/02/18 0500 12/02/18 1000  BP: (!) 160/61 (!) 172/59  Pulse: 66 70  Resp: 18   Temp: 97.7 F (36.5 C) 98 F (36.7 C)  SpO2: 99% 100%   Vitals:   12/01/18 0537 12/01/18 2116 12/02/18 0500 12/02/18 1000  BP: (!) 147/49 (!) 147/52 (!) 160/61 (!) 172/59  Pulse: (!) 58 72 66 70  Resp: _0 Temp: 98.2 F (36.8 C) 98.5 F (36.9 C) 97.7 F (36.5 C) 98 F (36.7 C)  TempSrc: Oral Oral Oral Oral  SpO2: 99% 98% 99% 100%  Weight:      Height:        General: Pt is alert, awake, not in acute distress Cardiovascular: RRR, S1/S2 +, no rubs, no gallops Respiratory: CTA bilaterally, no wheezing, no rhonchi Abdominal: Soft, NT, ND, bowel sounds + Extremities: trace LE edema, no cyanosis   The results of significant diagnostics from this hospitalization (including imaging, microbiology, ancillary and  laboratory) are listed below for reference.    Significant Diagnostic Studies: Mr Foot Right Wo Contrast  Result Date: 12/01/2018 CLINICAL DATA:  Chronic skin ulceration on the plantar surface of the right foot in a diabetic patient. EXAM: MRI OF THE RIGHT FOREFOOT WITHOUT CONTRAST TECHNIQUE: Multiplanar, multisequence MR imaging of the right forefoot was performed. No intravenous contrast was administered. COMPARISON:  Plain films right foot 11/30/2018. FINDINGS: Bones/Joint/Cartilage Bone marrow signal is normal without evidence of osteomyelitis, fracture, stress change or worrisome lesion. Ligaments Intact. Muscles and Tendons Intact.  No intramuscular fluid collection. Soft tissues Subcutaneous edema is present over the dorsum of the foot. No focal fluid collection is identified. Markers placed on the plantar surface of the third toe IMPRESSION: Subcutaneous edema over the dorsum of the foot could be due to dependent change or cellulitis. Negative for abscess, osteomyelitis or septic joint. Electronically Signed   By: Inge Rise M.D.   On: 12/01/2018 09:13   US Venous Img Lower Bilateral  Result Date: 12/01/2018 CLINICAL DATA:  Bilateral lower extremity edema.  Evaluate for DVT. EXAM: BILATERAL LOWER EXTREMITY VENOUS DOPPLER ULTRASOUND TECHNIQUE: Gray-scale sonography with graded compression, as well as color Doppler and duplex ultrasound were performed to evaluate the lower extremity deep venous systems from the level of the common femoral vein and including the common femoral, femoral, profunda femoral, popliteal and calf veins including the posterior tibial, peroneal and gastrocnemius veins when visible. The superficial great saphenous vein was also interrogated. Spectral Doppler was utilized to evaluate flow at rest and with distal augmentation maneuvers in the common femoral, femoral and popliteal veins. COMPARISON:  None. FINDINGS: RIGHT LOWER EXTREMITY Common Femoral Vein: No evidence of  thrombus. Normal compressibility, respiratory phasicity and response to augmentation. Saphenofemoral Junction: No evidence of thrombus. Normal compressibility and flow on color Doppler imaging. Profunda Femoral Vein: No evidence of thrombus. Normal compressibility and flow on color Doppler imaging. Femoral Vein: No evidence of thrombus. Normal compressibility, respiratory phasicity and response to augmentation. Popliteal Vein: No evidence of thrombus. Normal compressibility, respiratory phasicity and response to augmentation. Calf Veins: No evidence of thrombus. Normal compressibility and flow on color Doppler imaging. Superficial Great Saphenous Vein: No evidence of thrombus. Normal compressibility. Venous Reflux:  None. Other Findings: There is hypoechoic occlusive thrombus within the right lesser saphenous vein (image 38 and 39. LEFT LOWER EXTREMITY Common Femoral Vein: No evidence of thrombus. Normal compressibility, respiratory phasicity and response to augmentation. Saphenofemoral Junction: No evidence of thrombus. Normal compressibility and flow on color Doppler imaging. Profunda Femoral Vein: No evidence of thrombus. Normal compressibility and flow on color Doppler imaging. Femoral Vein: There is mixed echogenic nonocclusive wall thickening/chronic DVT involving the mid (image 71) aspect of the left femoral vein). The proximal and distal aspects of the femoral vein appear patent where imaged. Popliteal Vein: No evidence of thrombus. Normal compressibility, respiratory phasicity and response to augmentation. Calf Veins: No evidence of thrombus. Normal compressibility and flow on color Doppler imaging. Superficial Great Saphenous Vein: No evidence of thrombus. Normal compressibility.  Venous Reflux:  None. Other Findings:  None. IMPRESSION: 1. No evidence of acute DVT within either lower extremity. 2. Age-indeterminate, though potentially acute, occlusive superficial thrombophlebitis involving the right lesser  saphenous vein. 3. 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 Electronically Signed   By: Sandi Mariscal M.D.   On: 12/01/2018 12:03   Dg Chest Port 1 View  Result Date: 11/30/2018 CLINICAL DATA:  Fever, weakness EXAM: PORTABLE CHEST 1 VIEW COMPARISON:  12/27/2014 FINDINGS: The heart size and mediastinal contours are within normal limits. Both lungs are clear. The visualized skeletal structures are unremarkable. IMPRESSION: No acute cardiopulmonary findings. Electronically Signed   By: Davina Poke M.D.   On: 11/30/2018 10:48   Dg Foot Complete Right  Result Date: 11/30/2018 CLINICAL DATA:  Chronic open wound on the right foot for 2 years. Fever. EXAM: RIGHT FOOT COMPLETE - 3+ VIEW COMPARISON:  None. FINDINGS: There is no evidence of osteomyelitis or significant arthritis. Calcaneal enthesophytes. Soft tissue swelling of the forefoot. Soft tissue ulcer noted in the ball of the foot. IMPRESSION: Soft tissue ulcer in the ball of the foot. Otherwise, normal exam. Specifically, no evidence of osteomyelitis. Electronically Signed   By: Lorriane Shire M.D.   On: 11/30/2018 10:51     Microbiology: Recent Results (from the past 240 hour(s))  Urine culture     Status: None   Collection Time: 11/30/18  9:33 AM   Specimen: Urine, Catheterized  Result Value Ref Range Status   Specimen Description   Final    URINE, CATHETERIZED Performed at [facility], [address] [place], [place] [address]    Special Requests   Final    NONE Performed at [facility], [address], [place], [place] [address]    Culture   Final    NO GROWTH Performed at [facility], [place] [address] [place], [place] [address]    Report Status 12/01/2018 FINAL  Final  Blood culture (routine x 2)     Status: None (Preliminary result)   Collection Time: 11/30/18 10:58 AM   Specimen: BLOOD  Result Value Ref Range Status   Specimen Description BLOOD  Final   Special  Requests   Final    BOTTLES DRAWN AEROBIC AND ANAEROBIC Blood Culture adequate volume   Culture   Final    NO GROWTH 2 DAYS Performed at [facility], [address], [place], [place] [address]    Report Status PENDING  Incomplete  Blood culture (routine x 2)     Status: None (Preliminary result)   Collection Time: 11/30/18 11:04 AM   Specimen: BLOOD  Result Value Ref Range Status   Specimen Description BLOOD  Final   Special Requests   Final    BOTTLES DRAWN AEROBIC ONLY Blood Culture results may not be optimal due to an inadequate volume of blood received in culture bottles   Culture   Final    NO GROWTH 2 DAYS Performed at [facility], [address] [place], [place] [address]    Report Status PENDING  Incomplete  SARS CORONAVIRUS 2 (Brandt Chaney 6-24 HRS) Nasopharyngeal Nasopharyngeal Swab     Status: None   Collection Time: 11/30/18 12:02 PM   Specimen: Nasopharyngeal Swab  Result Value Ref Range Status   SARS Coronavirus 2 NEGATIVE NEGATIVE Final    Comment: (NOTE) SARS-CoV-2 target nucleic acids are NOT DETECTED. The SARS-CoV-2 RNA is generally detectable in upper and lower respiratory specimens during the  acute phase of infection. Negative results do not preclude SARS-CoV-2 infection, do not rule out co-infections with other pathogens, and should not be used as the sole basis for treatment or other patient management decisions. Negative results must be combined with clinical observations, patient history, and epidemiological information. The expected result is Negative. Fact Sheet for Patients: SugarRoll.be Fact Sheet for Healthcare Providers: https://www.woods-mathews.com/ This test is not yet approved or cleared by the [place] FDA and  has been authorized for detection and/or diagnosis of SARS-CoV-2 by FDA under an Emergency Use Authorization (EUA). This EUA will remain  in effect (meaning this test can be used) for the duration  of the COVID-19 declaration under Section 56 4(b)(1) of the Act, 21 U.S.C. section 360bbb-3(b)(1), unless the authorization is terminated or revoked sooner. Performed at [facility], [place] [address] [place], [place] [address]      Labs: Basic Metabolic Panel: Recent Labs  Lab 11/30/18 1024 12/01/18 0702 12/02/18 0625  NA 138 140 141  K 3.2* 3.2* 3.9  CL 105 109 110  CO2 _0 GLUCOSE 199* 130* 158*  BUN 28* 25* 25*  CREATININE 1.98* 1.95* 1.79*  CALCIUM 7.8* 7.9* 8.2*  MG  --  1.7 2.2   Liver Function Tests: Recent Labs  Lab 11/30/18 1956 12/01/18 0702  AST 20 19  ALT 19 18  ALKPHOS 62 55  BILITOT 0.7 0.5  PROT 5.7* 5.7*  ALBUMIN 2.8* 2.7*   No results for input(s): LIPASE, AMYLASE in the last 168 hours. No results for input(s): AMMONIA in the last 168 hours. CBC: Recent Labs  Lab 11/30/18 1024 12/01/18 0702 12/02/18 0625  WBC 23.2* 10.9* 8.1  NEUTROABS 20.3*  --   --   HGB 10.5* 10.3* 10.3*  HCT 32.0* 32.4* 32.8*  MCV 88.9 91.0 91.4  PLT 222 208 211   Cardiac Enzymes: Recent Labs  Lab 11/30/18 1956  CKTOTAL 63   BNP: Invalid input(s): POCBNP CBG: No results for input(s): GLUCAP in the last 168 hours.  Time coordinating discharge:  36 minutes  Signed:  Orson Eva, DO [facility] Pager: [phone] 12/02/2018, 12:43 PM

## 2018-12-05 LAB — CULTURE, BLOOD (ROUTINE X 2)
Culture: NO GROWTH
Culture: NO GROWTH
Special Requests: ADEQUATE

## 2019-03-12 ENCOUNTER — Telehealth: Payer: Self-pay | Admitting: Cardiology

## 2019-03-12 NOTE — Telephone Encounter (Signed)

## 2019-03-15 ENCOUNTER — Encounter: Payer: Self-pay | Admitting: Cardiology

## 2019-03-15 ENCOUNTER — Telehealth (INDEPENDENT_AMBULATORY_CARE_PROVIDER_SITE_OTHER): Payer: Medicare Other | Admitting: Cardiology

## 2019-03-15 ENCOUNTER — Encounter: Payer: Self-pay | Admitting: *Deleted

## 2019-03-15 VITALS — Ht 60.0 in | Wt 175.0 lb

## 2019-03-15 DIAGNOSIS — I6523 Occlusion and stenosis of bilateral carotid arteries: Secondary | ICD-10-CM

## 2019-03-15 DIAGNOSIS — N184 Chronic kidney disease, stage 4 (severe): Secondary | ICD-10-CM

## 2019-03-15 DIAGNOSIS — I2510 Atherosclerotic heart disease of native coronary artery without angina pectoris: Secondary | ICD-10-CM

## 2019-03-15 DIAGNOSIS — E782 Mixed hyperlipidemia: Secondary | ICD-10-CM

## 2019-03-15 DIAGNOSIS — I10 Essential (primary) hypertension: Secondary | ICD-10-CM

## 2019-03-15 NOTE — Progress Notes (Signed)
Virtual Visit via Telephone Note   This visit type was conducted due to national recommendations for restrictions regarding the COVID-19 Pandemic (e.g. social distancing) in an effort to limit this patient's exposure and mitigate transmission in our community.  Due to her co-morbid illnesses, this patient is at least at moderate risk for complications without adequate follow up.  This format is felt to be most appropriate for this patient at this time.  The patient did not have access to video technology/had technical difficulties with video requiring transitioning to audio format only (telephone).  All issues noted in this document were discussed and addressed.  No physical exam could be performed with this format.  Please refer to the patient's chart for her  consent to telehealth for [facility].   Date:  03/15/2019   ID:  Sabrina Wheeler, DOB 01/14/1946, MRN [id number]  Patient Location: Home Provider Location: Office  PCP:  Wyatt Haste, NP  Cardiologist:  Carlyle Dolly, MD  Electrophysiologist:  None   Evaluation Performed:  Follow-Up Visit  Chief Complaint:  Follow up visit  History of Present Illness:    Sabrina Wheeler is a 74 y.o. female seen today for follow up of the following medical problems.   1. CAD - history of NSTEMI, received DES to LAD 01/06/15 at [place] - 10/2016echo LVEF 40-45%, apical ballooning, grade I diastolic dysfunction - 83/1517 echo LVEF 55-60%, no WMAs, grade I diastolic dysfunction.     - no recent chest pain. No SOB or DOE - compliant with meds  2. Hyperlipidemia 09/2016 TC 139 TG 260 HDL 29 LDL 58 10/2017 TC 142 TG 230 HDL 30 LDL 66  - labs followed by pcp, compliant with meds   3. CKD - followed by pcp and nephrology, Dr Theador Hawthorne    4. HTN - she reports history of white coat HTN - she does check at home.  - compliant with meds  5. Carotid stenosis - 09/2017 carotid US RICA 6-16%, LICA 0-73%   SH: husband  is patient of mine as well Vita Barley. Husband with recent knee injury. Had a fall after, requiring repeat knee procedure.Son in law [place] is getting ready to establish as a patient.  Recent great child born, little girl. They are going to visit over Thanksgiving.    The patient does not have symptoms concerning for COVID-19 infection (fever, chills, cough, or new shortness of breath).    Past Medical History:  Diagnosis Date  . Broken ankle 2008   left   . Diabetes mellitus without complication ([place])   . Hypertension   . Obesity    Past Surgical History:  Procedure Laterality Date  . INCISIONAL HERNIA REPAIR       Current Meds  Medication Sig  . amLODipine (NORVASC) 10 MG tablet Take 10 mg by mouth daily.  Marland Kitchen aspirin EC 81 MG tablet Take 81 mg by mouth daily.  Marland Kitchen atorvastatin (LIPITOR) 80 MG tablet Take 1 tablet by mouth daily.  . Calcium Carb-Cholecalciferol (CALCIUM 600+D) 600-800 MG-UNIT TABS Take 1 tablet by mouth at bedtime.  . carvedilol (COREG) 6.25 MG tablet Take 6.25 mg by mouth 2 (two) times daily with a meal.  . Cholecalciferol 1000 UNITS capsule Take 1,000 Units by mouth at bedtime.  . Chromium-Cinnamon [phone] MCG-MG CAPS Take 1 capsule by mouth daily.  . Cranberry-Vitamin C-Vitamin E (CRANBERRY PLUS VITAMIN C) 4200-20-3 MG-MG-UNIT CAPS Take 1 capsule by mouth daily.  . fexofenadine (ALLEGRA) 180 MG tablet  Take 180 mg by mouth daily.   . fluticasone (FLONASE) 50 MCG/ACT nasal spray Place 1 spray into both nostrils at bedtime.  . gabapentin (NEURONTIN) 100 MG capsule Take 200 mg by mouth 2 (two) times daily.  . Glucosamine-Chondroitin (OSTEO BI-FLEX REGULAR STRENGTH PO) Take 1 capsule by mouth 2 (two) times daily.  Marland Kitchen LECITHIN PO Take 1 capsule by mouth daily. 1325mg   . lisinopril (ZESTRIL) 5 MG tablet Take 5 mg by mouth daily.  . Multiple Vitamins-Minerals (MULTIVITAMIN WITH MINERALS) tablet Take 1 tablet by mouth daily.  Marland Kitchen OVER THE COUNTER MEDICATION  Take 1 tablet by mouth daily. Osteo-Bi Flex with Turmeric  . pantoprazole (PROTONIX) 40 MG tablet Take 40 mg by mouth daily.  . Red Yeast Rice 600 MG TABS Take 600 mg by mouth 2 (two) times daily.  . SODIUM BICARBONATE PO Take by mouth.  . torsemide (DEMADEX) 20 MG tablet Take 20 mg by mouth daily.  . TURMERIC PO Take by mouth daily.     Allergies:   Macrobid [nitrofurantoin monohyd macro], Doxycycline, and Clindamycin/lincomycin   Social History   Tobacco Use  . Smoking status: Never Smoker  . Smokeless tobacco: Never Used  Substance Use Topics  . Alcohol use: No  . Drug use: No     Family Hx: The patient's family history includes Cancer in her mother; Heart attack in her father; Heart disease in her father.  ROS:   Please see the history of present illness.     All other systems reviewed and are negative.   Prior CV studies:   The following studies were reviewed today:  12/2014 cath at [facility]: Coronary Angiography 1. Left Main - Normal 2. Left anterior descending artery - heavily calcified 95% proximal, 25% distal 3. Diagonals - 25% ostial 4. Left Circumflex - 25% proximal 5. Obtuse Marginals - 25% 6. Right Coronary Artery - 25% proximal, 50% mid 7. Posterior Descending Artery - Normal Additional comments on angiography: Right Dominance  Hemodynamics 1. Aortic Pressure - 138/58 mmHg 2. Left Ventricular - 138/20 mmHg Additional comments on on hemodynamics: None.   Left Ventriculography:Not Performed.   Percutaneous coronary intervention: An EBU 3.5 guide catheter gave excellent support. The 95% proximal LAD lesion was heavily calcified and easily crossed with a BMW wire. We predilated with a 2.0 x 15 mm trek balloon. We implanted a 2.25 x 20 mm Synergy drug-eluting stent. This was chosen because of vessel tortuosity heavy calcification. Subsequent angiography demonstrated an excellent result with lesion reduction from 95% to 0% with no complicating  features. This was performed using aspirin, Effient,  and heparin.  CONCLUSIONS:  1. Successful transradial cardiac catheterization 2. Obstructive one-vessel coronary artery disease 3. Status post drug-eluting stent implantation to the LAD with lesion reduction from 95% to 0% 4 left ventricular diastolic pressure 20  RECOMMENDATIONS: The patient will need to remain on Aspirin and Effient for minimum of one year.  12/2015 Echo Interpretation Summary A complete two-dimensional transthoracic echocardiogram was performed. The left ventricle is grossly normal size. There is normal left ventricular wall thickness. The left ventricular ejection fraction is moderately reduced (40-45%). Apical Ballooning Grade I mild diastolic dysfunction; abnormal relaxation pattern. There is mild (1+) mitral regurgitation. Increased Left ventricle filling pressure Mild aortic sclerosis is present with good valvular opening. There is mild to moderate (1-2+) tricuspid regurgitation. The left atrium is mildly dilated.  01/2016 echo Study Conclusions  - Left ventricle: The cavity size was normal. Wall thickness was increased in  a pattern of mild LVH. Systolic function was normal. The estimated ejection fraction was in the range of 55% to 60%. Wall motion was normal; there were no regional wall motion abnormalities. Doppler parameters are consistent with abnormal left ventricular relaxation (grade 1 diastolic dysfunction). - Aortic valve: Mildly calcified annulus. Trileaflet. - Mitral valve: Calcified annulus. There was trivial regurgitation. - Right atrium: Central venous pressure (est): 3 mm Hg. - Atrial septum: No defect or patent foramen ovale was identified. - Tricuspid valve: There was trivial regurgitation. - Pulmonary arteries: PA peak pressure: 16 mm Hg (S). - Pericardium, extracardiac: There was no pericardial effusion.  Impressions:  - Mild LVH with LVEF 55-60%. Grade 1  diastolic dysfunction. Mildly calcified mitral annulus with trivial mitral regurgitation. Mildly calcified aortic annulus. Trivial tricuspid regurgitation with normal estimated PASP. No pericardial effusion.   09/2017 carotid US Final Interpretation: Right Carotid: Velocities in the right ICA are consistent with a 1-39% stenosis.  Left Carotid: Velocities in the left ICA are consistent with a 1-39% stenosis.  Vertebrals: Bilateral vertebral arteries demonstrate antegrade flow. Subclavians: Normal flow hemodynamics were seen in bilateral subclavian       arteries.  Labs/Other Tests and Data Reviewed:    EKG:  No ECG reviewed.  Recent Labs: 11/30/2018: TSH 0.882 12/01/2018: ALT 18 12/02/2018: BUN 25; Creatinine, Ser 1.79; Hemoglobin 10.3; Magnesium 2.2; Platelets 211; Potassium 3.9; Sodium 141   Recent Lipid Panel No results found for: CHOL, TRIG, HDL, CHOLHDL, LDLCALC, LDLDIRECT  Wt Readings from Last 3 Encounters:  03/15/19 175 lb (79.4 kg)  11/30/18 179 lb 14.3 oz (81.6 kg)  11/29/17 180 lb 12.8 oz (82 kg)     Objective:    Vital Signs:  Ht 5' (1.524 m)   Wt 175 lb (79.4 kg)   BMI 34.18 kg/m    Normal affect. Normal speech pattern and tone. Comfortable, no apparent distress. No audible signs of SOB or wheezing.   ASSESSMENT & PLAN:    1. CAD -doing well without recent symptoms, continue current meds   2. Hyperlipidemia -request pcp labs, continue statin  3. CKD IV - followed by renal - request most recent labs from pcp  4. HTN - continue current meds  5. Carotid stenosis - only mild disease by [place], continue medical therapy. Likely repeat next year [place]  COVID-19 Education: The signs and symptoms of COVID-19 were discussed with the patient and how to seek care for testing (follow up with PCP or arrange E-visit).  The importance of social distancing was discussed today.  Time:   Today, I have spent 20 minutes with the patient with  telehealth technology discussing the above problems.     Medication Adjustments/Labs and Tests Ordered: Current medicines are reviewed at length with the patient today.  Concerns regarding medicines are outlined above.   Tests Ordered: No orders of the defined types were placed in this encounter.   Medication Changes: No orders of the defined types were placed in this encounter.   Follow Up:  Either In Person or Virtual in 6 month(s)  Signed, Carlyle Dolly, MD  03/15/2019 8:20 AM    [place]

## 2019-03-15 NOTE — Patient Instructions (Signed)

## 2019-04-06 ENCOUNTER — Encounter: Payer: Medicare Other | Admitting: Vascular Surgery

## 2019-04-06 ENCOUNTER — Encounter (HOSPITAL_COMMUNITY): Payer: Medicare Other

## 2019-04-24 ENCOUNTER — Ambulatory Visit (HOSPITAL_COMMUNITY): Payer: Medicare Other

## 2019-04-24 ENCOUNTER — Encounter: Payer: Medicare Other | Admitting: Vascular Surgery

## 2019-06-29 ENCOUNTER — Emergency Department (HOSPITAL_COMMUNITY): Payer: Medicare Other

## 2019-06-29 ENCOUNTER — Other Ambulatory Visit: Payer: Self-pay

## 2019-06-29 ENCOUNTER — Emergency Department (HOSPITAL_COMMUNITY)
Admission: EM | Admit: 2019-06-29 | Discharge: 2019-06-29 | Disposition: A | Discharge status: Home / Self Care | Payer: Medicare Other | Attending: Emergency Medicine | Admitting: Emergency Medicine

## 2019-06-29 ENCOUNTER — Encounter (HOSPITAL_COMMUNITY): Payer: Self-pay | Admitting: Emergency Medicine

## 2019-06-29 DIAGNOSIS — R0789 Other chest pain: Secondary | ICD-10-CM | POA: Diagnosis not present

## 2019-06-29 DIAGNOSIS — I10 Essential (primary) hypertension: Secondary | ICD-10-CM | POA: Diagnosis not present

## 2019-06-29 DIAGNOSIS — E119 Type 2 diabetes mellitus without complications: Secondary | ICD-10-CM | POA: Insufficient documentation

## 2019-06-29 DIAGNOSIS — R102 Pelvic and perineal pain: Secondary | ICD-10-CM | POA: Insufficient documentation

## 2019-06-29 DIAGNOSIS — F0390 Unspecified dementia without behavioral disturbance: Secondary | ICD-10-CM | POA: Diagnosis not present

## 2019-06-29 DIAGNOSIS — Z20822 Contact with and (suspected) exposure to covid-19: Secondary | ICD-10-CM | POA: Diagnosis not present

## 2019-06-29 DIAGNOSIS — Z7984 Long term (current) use of oral hypoglycemic drugs: Secondary | ICD-10-CM | POA: Insufficient documentation

## 2019-06-29 DIAGNOSIS — Z7982 Long term (current) use of aspirin: Secondary | ICD-10-CM | POA: Diagnosis not present

## 2019-06-29 DIAGNOSIS — Z79899 Other long term (current) drug therapy: Secondary | ICD-10-CM | POA: Diagnosis not present

## 2019-06-29 LAB — CBC WITH DIFFERENTIAL/PLATELET
Abs Immature Granulocytes: 0.1 10*3/uL — ABNORMAL HIGH (ref 0.00–0.07)
Basophils Absolute: 0.1 10*3/uL (ref 0.0–0.1)
Basophils Relative: 1 %
Eosinophils Absolute: 0.6 10*3/uL — ABNORMAL HIGH (ref 0.0–0.5)
Eosinophils Relative: 5 %
HCT: 36.7 % (ref 36.0–46.0)
Hemoglobin: 11.5 g/dL — ABNORMAL LOW (ref 12.0–15.0)
Immature Granulocytes: 1 %
Lymphocytes Relative: 19 %
Lymphs Abs: 2.1 10*3/uL (ref 0.7–4.0)
MCH: 28.8 pg (ref 26.0–34.0)
MCHC: 31.3 g/dL (ref 30.0–36.0)
MCV: 91.8 fL (ref 80.0–100.0)
Monocytes Absolute: 0.8 10*3/uL (ref 0.1–1.0)
Monocytes Relative: 7 %
Neutro Abs: 7.5 10*3/uL (ref 1.7–7.7)
Neutrophils Relative %: 67 %
Platelets: 670 10*3/uL — ABNORMAL HIGH (ref 150–400)
RBC: 4 MIL/uL (ref 3.87–5.11)
RDW: 14 % (ref 11.5–15.5)
WBC: 11.2 10*3/uL — ABNORMAL HIGH (ref 4.0–10.5)
nRBC: 0 % (ref 0.0–0.2)

## 2019-06-29 LAB — COMPREHENSIVE METABOLIC PANEL
ALT: 35 U/L (ref 0–44)
AST: 34 U/L (ref 15–41)
Albumin: 2.9 g/dL — ABNORMAL LOW (ref 3.5–5.0)
Alkaline Phosphatase: 156 U/L — ABNORMAL HIGH (ref 38–126)
Anion gap: 11 (ref 5–15)
BUN: 28 mg/dL — ABNORMAL HIGH (ref 8–23)
CO2: 21 mmol/L — ABNORMAL LOW (ref 22–32)
Calcium: 9 mg/dL (ref 8.9–10.3)
Chloride: 107 mmol/L (ref 98–111)
Creatinine, Ser: 2.21 mg/dL — ABNORMAL HIGH (ref 0.44–1.00)
GFR calc Af Amer: 25 mL/min — ABNORMAL LOW (ref >60)
GFR calc non Af Amer: 21 mL/min — ABNORMAL LOW (ref >60)
Glucose, Bld: 131 mg/dL — ABNORMAL HIGH (ref 70–99)
Potassium: 3.8 mmol/L (ref 3.5–5.1)
Sodium: 139 mmol/L (ref 135–145)
Total Bilirubin: 0.8 mg/dL (ref 0.3–1.2)
Total Protein: 7.7 g/dL (ref 6.5–8.1)

## 2019-06-29 LAB — URINALYSIS, ROUTINE W REFLEX MICROSCOPIC
Bilirubin Urine: NEGATIVE
Glucose, UA: 50 mg/dL — AB
Hgb urine dipstick: NEGATIVE
Ketones, ur: NEGATIVE mg/dL
Leukocytes,Ua: NEGATIVE
Nitrite: NEGATIVE
Protein, ur: >=300 mg/dL — AB
Specific Gravity, Urine: 1.018 (ref 1.005–1.030)
pH: 5 (ref 5.0–8.0)

## 2019-06-29 LAB — SARS CORONAVIRUS 2 (TAT 6-24 HRS): SARS Coronavirus 2: NEGATIVE

## 2019-06-29 LAB — TROPONIN I (HIGH SENSITIVITY)
Troponin I (High Sensitivity): 9 ng/L (ref <18)
Troponin I (High Sensitivity): 10 ng/L (ref <18)

## 2019-06-29 LAB — LIPASE, BLOOD: Lipase: 44 U/L (ref 11–51)

## 2019-06-29 NOTE — ED Provider Notes (Signed)
[facility] EMERGENCY DEPARTMENT Provider Note   CSN: [id number] Arrival date & time: 06/29/19  8366   Time seen 4:47 AM  History Chief Complaint  Patient presents with  . Chest Pain  . Abdominal Pain   Level 5 caveat for dementia  Sabrina Wheeler is a 74 y.o. female.  HPI   EMS states they were called for chest and abdominal pain.  They gave the patient 1 nitroglycerin and her pain went away.  They state that her EKG did not show any acute changes.  When I talked to the patient she cannot answer questions appropriately.  She does hold her hand in her suprapubic area and tells me that she has pain there.  When I asked her to describe the pain she states "it is big but not overly big".  She did at some point use the word cramping.  She denies nausea, vomiting, diarrhea, chest pain.  When I asked her about shortness of breath she states "my dog was in the house with me".  She then states she had mild shortness of breath.  EMS states they were told she has some dementia.  When I asked her if she knew where she was she said yes but she could not tell me where she is.  PCP Wyatt Haste, NP   Past Medical History:  Diagnosis Date  . Broken ankle 2008   left   . Diabetes mellitus without complication ([place])   . Hypertension   . Obesity     Patient Active Problem List   Diagnosis Date Noted  . Sepsis due to undetermined organism ([place]) 11/30/2018  . Essential hypertension 11/30/2018  . Mixed hyperlipidemia 11/30/2018  . Cellulitis of lower extremity   . Hypokalemia   . Ischemic ulcer ([place]) 11/08/2013  . Atherosclerosis of native arteries of the extremities with ulceration(440.23) 11/08/2013    Past Surgical History:  Procedure Laterality Date  . BILE DUCT STENT PLACEMENT    . INCISIONAL HERNIA REPAIR       OB History   No obstetric history on file.     Family History  Problem Relation Age of Onset  . Cancer Mother   . Heart disease Father   . Heart attack Father      Social History   Tobacco Use  . Smoking status: Never Smoker  . Smokeless tobacco: Never Used  Substance Use Topics  . Alcohol use: No  . Drug use: No  lives at home Lives with spouse  Home Medications Prior to Admission medications   Medication Sig Start Date End Date Taking? Authorizing Provider  amLODipine (NORVASC) 10 MG tablet Take 10 mg by mouth daily.    [provider]  aspirin EC 81 MG tablet Take 81 mg by mouth daily.    [provider]  atorvastatin (LIPITOR) 80 MG tablet Take 1 tablet by mouth daily. 09/25/18   [provider]  Calcium Carb-Cholecalciferol (CALCIUM 600+D) 600-800 MG-UNIT TABS Take 1 tablet by mouth at bedtime.    [provider]  carvedilol (COREG) 6.25 MG tablet Take 6.25 mg by mouth 2 (two) times daily with a meal.    [provider]  Cholecalciferol 1000 UNITS capsule Take 1,000 Units by mouth at bedtime.    [provider]  Chromium-Cinnamon [phone] MCG-MG CAPS Take 1 capsule by mouth daily.    [provider]  Cranberry-Vitamin C-Vitamin E (CRANBERRY PLUS VITAMIN C) 4200-20-3 MG-MG-UNIT CAPS Take 1 capsule by mouth daily.  [provider]  fexofenadine (ALLEGRA) 180 MG tablet Take 180 mg by mouth daily.     [provider]  fluticasone (FLONASE) 50 MCG/ACT nasal spray Place 1 spray into both nostrils at bedtime.    [provider]  gabapentin (NEURONTIN) 100 MG capsule Take 200 mg by mouth 2 (two) times daily.    [provider]  Glucosamine-Chondroitin (OSTEO BI-FLEX REGULAR STRENGTH PO) Take 1 capsule by mouth 2 (two) times daily.    [provider]  LECITHIN PO Take 1 capsule by mouth daily. 1346m    [provider]  lisinopril (ZESTRIL) 5 MG tablet Take 5 mg by mouth daily.    [provider]  Multiple Vitamins-Minerals (MULTIVITAMIN WITH MINERALS) tablet Take 1 tablet by mouth daily.    [provider]  OVER  THE COUNTER MEDICATION Take 1 tablet by mouth daily. Osteo-Bi Flex with Turmeric    [provider]  pantoprazole (PROTONIX) 40 MG tablet Take 40 mg by mouth daily.    [provider]  Red Yeast Rice 600 MG TABS Take 600 mg by mouth 2 (two) times daily.    [provider]  SODIUM BICARBONATE PO Take by mouth.    [provider]  torsemide (DEMADEX) 20 MG tablet Take 20 mg by mouth daily.    [provider]  TURMERIC PO Take by mouth daily.    [provider]    Allergies    Macrobid [nitrofurantoin monohyd macro], Sulfamethoxazole-trimethoprim, Doxycycline, and Clindamycin/lincomycin  Review of Systems   Review of Systems  Unable to perform ROS: Dementia    Physical Exam Updated Vital Signs BP (!) 188/63   Pulse 62   Resp 19   Ht 5' (1.524 m)   Wt 81.6 kg   SpO2 100%   BMI 35.15 kg/m   Physical Exam Vitals and nursing note reviewed.  Constitutional:      General: She is not in acute distress.    Appearance: Normal appearance.     Comments: Pleasant, smiling  HENT:     Head: Normocephalic and atraumatic.     Right Ear: External ear normal.     Left Ear: External ear normal.  Eyes:     Extraocular Movements: Extraocular movements intact.     Conjunctiva/sclera: Conjunctivae normal.     Pupils: Pupils are equal, round, and reactive to light.  Cardiovascular:     Rate and Rhythm: Normal rate and regular rhythm.  Pulmonary:     Effort: Pulmonary effort is normal.     Breath sounds: Normal breath sounds.  Abdominal:     General: Abdomen is flat. Bowel sounds are normal.     Palpations: Abdomen is soft.     Tenderness: There is abdominal tenderness in the suprapubic area.  Musculoskeletal:        General: Normal range of motion.     Cervical back: Normal range of motion and neck supple.  Skin:    General: Skin is warm and dry.     Capillary Refill: Capillary refill takes less than 2 seconds.  Neurological:      General: No focal deficit present.     Mental Status: She is alert.     Cranial Nerves: No cranial nerve deficit.  Psychiatric:        Mood and Affect: Mood normal.        Behavior: Behavior normal.        Thought Content: Thought content normal.  ED Results / Procedures / Treatments   Labs (all labs ordered are listed, but only abnormal results are displayed) Results for orders placed or performed during the hospital encounter of 06/29/19  Comprehensive metabolic panel  Result Value Ref Range   Sodium 139 135 - 145 mmol/L   Potassium 3.8 3.5 - 5.1 mmol/L   Chloride 107 98 - 111 mmol/L   CO2 21 (L) 22 - 32 mmol/L   Glucose, Bld 131 (H) 70 - 99 mg/dL   BUN 28 (H) 8 - 23 mg/dL   Creatinine, Ser 2.21 (H) 0.44 - 1.00 mg/dL   Calcium 9.0 8.9 - 10.3 mg/dL   Total Protein 7.7 6.5 - 8.1 g/dL   Albumin 2.9 (L) 3.5 - 5.0 g/dL   AST 34 15 - 41 U/L   ALT 35 0 - 44 U/L   Alkaline Phosphatase 156 (H) 38 - 126 U/L   Total Bilirubin 0.8 0.3 - 1.2 mg/dL   GFR calc non Af Amer 21 (L) >60 mL/min   GFR calc Af Amer 25 (L) >60 mL/min   Anion gap 11 5 - 15  CBC with Differential  Result Value Ref Range   WBC 11.2 (H) 4.0 - 10.5 K/uL   RBC 4.00 3.87 - 5.11 MIL/uL   Hemoglobin 11.5 (L) 12.0 - 15.0 g/dL   HCT 36.7 36.0 - 46.0 %   MCV 91.8 80.0 - 100.0 fL   MCH 28.8 26.0 - 34.0 pg   MCHC 31.3 30.0 - 36.0 g/dL   RDW 14.0 11.5 - 15.5 %   Platelets 670 (H) 150 - 400 K/uL   nRBC 0.0 0.0 - 0.2 %   Neutrophils Relative % 67 %   Neutro Abs 7.5 1.7 - 7.7 K/uL   Lymphocytes Relative 19 %   Lymphs Abs 2.1 0.7 - 4.0 K/uL   Monocytes Relative 7 %   Monocytes Absolute 0.8 0.1 - 1.0 K/uL   Eosinophils Relative 5 %   Eosinophils Absolute 0.6 (H) 0.0 - 0.5 K/uL   Basophils Relative 1 %   Basophils Absolute 0.1 0.0 - 0.1 K/uL   Immature Granulocytes 1 %   Abs Immature Granulocytes 0.10 (H) 0.00 - 0.07 K/uL  Lipase, blood  Result Value Ref Range   Lipase 44 11 - 51 U/L  Troponin I (High  Sensitivity)  Result Value Ref Range   Troponin I (High Sensitivity) 9 <18 ng/L   Laboratory interpretation all normal except mild increase in her prior stable renal insufficiency, normal LFTs except for her alk phos, nonfasting hyperglycemia, mild leukocytosis    EKG EKG Interpretation  Date/Time:  Friday June 29 2019 04:49:48 EDT Ventricular Rate:  65 PR Interval:    QRS Duration: 103 QT Interval:  450 QTC Calculation: 468 R Axis:   -17 Text Interpretation: Sinus rhythm Borderline left axis deviation Consider anterior infarct Baseline wander No significant change since last tracing 30 Nov 2018 Confirmed by Rolland Porter [phone]) on 06/29/2019 5:30:42 AM   Radiology DG Abdomen Acute W/Chest  Result Date: 06/29/2019 CLINICAL DATA:  Chest pain. Lower abdominal pain. Recent biliary stent placement. EXAM: DG ABDOMEN ACUTE W/ 1V CHEST COMPARISON:  MRCP 06/11/2019. CT abdomen 06/10/2019. Portable chest 06/10/2019. FINDINGS: Mediastinum hilar structures normal. Heart size normal. Lung volumes with mild right base subsegmental atelectasis. Tiny right pleural effusion. No pneumothorax. Surgical clips right upper quadrant. Biliary stent noted the right upper quadrant in good anatomic position. No bowel distention or free air. Thoracolumbar spine scoliosis and degenerative  change. Degenerative changes both hips. IMPRESSION: 1. Mild right base subsegmental atelectasis. Tiny right pleural effusion. 2. Surgical clips right upper quadrant. Biliary stent noted in good anatomic position. 3. No acute intra-abdominal abnormality identified. No bowel distention or free air. Electronically Signed   By: Marcello Moores  Register   On: 06/29/2019 06:01    Procedures Procedures (including critical care time)  Medications Ordered in ED Medications - No data to display  ED Course  I have reviewed the triage vital signs and the nursing notes.  Pertinent labs & imaging results that were available during my care of the  patient were reviewed by me and considered in my medical decision making (see chart for details).  Clinical Course as of Jun 29 715  Fri Jun 29, 2019  0703 Pt signed out to me by Dr Tomi Bamberger.  Briefly this is a 74 yo pleasantly demented female presenting to the ED with initial complaint of chest pain, although in the ED the patient repeatedly points to her bladder as the source of her pain.  Initial trop 9.  Pending 2nd troponin and UA.  Cr close to baseline here.  Anticipate possible discharge home    [MT]    Clinical Course User Index [MT] Trifan, Carola Rhine, MD   MDM Rules/Calculators/A&P                      Laboratory testing was done to evaluate her possible chest pain and her lower abdominal pain.   Recheck at 6:25 AM daughters in the room.  She states she talked to the patient on the phone and the patient told her she had chest pain.  When I asked her to show me where her chest hurts she puts her hand on her suprapubic area.  So I am not sure where she was hurting when she talked to her daughter but consistently for me she is put her hand on her suprapubic area.  I palpated her abdomen again especially because she had the stent placed in her bile duct last week at [facility] and she did had to use the bathroom.  Nursing staff put a hat on the toilet and sat her on the toilet however she missed the hat.  We were to proceed with it in and out cath.  I am hoping she has a UTI and then she can go home.  We also discussed getting a delta troponin just to make sure she did not have a cardiovascular event.  Pt turned over to Dr Langston Masker at change of shift to get results of her delta troponin and her UA.   Final Clinical Impression(s) / ED Diagnoses Final diagnoses:  Suprapubic abdominal pain    Rx / DC Orders Disposition pending  Rolland Porter, MD, Barbette Or, MD 06/29/19 [phone]

## 2019-06-29 NOTE — ED Provider Notes (Signed)
Clinical Course as of Jun 28 824  Fri Jun 29, 2019  0703 Pt signed out to me by Dr Tomi Bamberger.  Briefly this is a 74 yo pleasantly demented female presenting to the ED with initial complaint of chest pain, although in the ED the patient repeatedly points to her bladder as the source of her pain.  Initial trop 9.  Pending 2nd troponin and UA.  Cr close to baseline here.  Anticipate possible discharge home    [MT]    Clinical Course User Index [MT] Wyvonnia Dusky, MD    830 am on my reassessment, patient reports that she had 2 bowel movements which her daughter confirms.  Her abdominal pain is completely gone away.  I suspect this may been related to constipation.  Her labs are otherwise unremarkable and I think she is stable for discharge.  They are in agreement with the plan.   Wyvonnia Dusky, MD 06/29/19 [phone]

## 2019-06-29 NOTE — ED Triage Notes (Addendum)
Pt c/o abd pain and CP tat started about 1 hr ago. Pt recently had stent placed in bile duct placed. Pt given 1 of SL nitro in route and is now pain free.

## 2020-01-09 ENCOUNTER — Other Ambulatory Visit (HOSPITAL_COMMUNITY): Payer: Self-pay | Admitting: Neurology

## 2020-01-09 ENCOUNTER — Other Ambulatory Visit: Payer: Self-pay | Admitting: Neurology

## 2020-01-09 DIAGNOSIS — G309 Alzheimer's disease, unspecified: Secondary | ICD-10-CM

## 2020-01-09 DIAGNOSIS — F0280 Dementia in other diseases classified elsewhere without behavioral disturbance: Secondary | ICD-10-CM

## 2020-04-08 ENCOUNTER — Emergency Department (HOSPITAL_COMMUNITY): Payer: Medicare Other

## 2020-04-08 ENCOUNTER — Other Ambulatory Visit: Payer: Self-pay

## 2020-04-08 ENCOUNTER — Encounter (HOSPITAL_COMMUNITY): Payer: Self-pay | Admitting: Emergency Medicine

## 2020-04-08 ENCOUNTER — Emergency Department (HOSPITAL_COMMUNITY)
Admission: EM | Admit: 2020-04-08 | Discharge: 2020-04-08 | Disposition: A | Discharge status: Home / Self Care | Payer: Medicare Other | Attending: Emergency Medicine | Admitting: Emergency Medicine

## 2020-04-08 DIAGNOSIS — I129 Hypertensive chronic kidney disease with stage 1 through stage 4 chronic kidney disease, or unspecified chronic kidney disease: Secondary | ICD-10-CM | POA: Diagnosis not present

## 2020-04-08 DIAGNOSIS — Z7982 Long term (current) use of aspirin: Secondary | ICD-10-CM | POA: Diagnosis not present

## 2020-04-08 DIAGNOSIS — E1122 Type 2 diabetes mellitus with diabetic chronic kidney disease: Secondary | ICD-10-CM | POA: Diagnosis not present

## 2020-04-08 DIAGNOSIS — Z79899 Other long term (current) drug therapy: Secondary | ICD-10-CM | POA: Diagnosis not present

## 2020-04-08 DIAGNOSIS — L03115 Cellulitis of right lower limb: Secondary | ICD-10-CM | POA: Insufficient documentation

## 2020-04-08 DIAGNOSIS — N184 Chronic kidney disease, stage 4 (severe): Secondary | ICD-10-CM | POA: Insufficient documentation

## 2020-04-08 DIAGNOSIS — M7989 Other specified soft tissue disorders: Secondary | ICD-10-CM | POA: Diagnosis present

## 2020-04-08 HISTORY — DX: Disorder of kidney and ureter, unspecified: N28.9

## 2020-04-08 LAB — CBC WITH DIFFERENTIAL/PLATELET
Abs Immature Granulocytes: 0.05 10*3/uL (ref 0.00–0.07)
Basophils Absolute: 0.1 10*3/uL (ref 0.0–0.1)
Basophils Relative: 1 %
Eosinophils Absolute: 0.4 10*3/uL (ref 0.0–0.5)
Eosinophils Relative: 4 %
HCT: 30.4 % — ABNORMAL LOW (ref 36.0–46.0)
Hemoglobin: 9.8 g/dL — ABNORMAL LOW (ref 12.0–15.0)
Immature Granulocytes: 1 %
Lymphocytes Relative: 19 %
Lymphs Abs: 1.9 10*3/uL (ref 0.7–4.0)
MCH: 30.1 pg (ref 26.0–34.0)
MCHC: 32.2 g/dL (ref 30.0–36.0)
MCV: 93.3 fL (ref 80.0–100.0)
Monocytes Absolute: 1 10*3/uL (ref 0.1–1.0)
Monocytes Relative: 10 %
Neutro Abs: 6.7 10*3/uL (ref 1.7–7.7)
Neutrophils Relative %: 65 %
Platelets: 293 10*3/uL (ref 150–400)
RBC: 3.26 MIL/uL — ABNORMAL LOW (ref 3.87–5.11)
RDW: 12.5 % (ref 11.5–15.5)
WBC: 10.2 10*3/uL (ref 4.0–10.5)
nRBC: 0 % (ref 0.0–0.2)

## 2020-04-08 LAB — BASIC METABOLIC PANEL
Anion gap: 7 (ref 5–15)
BUN: 36 mg/dL — ABNORMAL HIGH (ref 8–23)
CO2: 20 mmol/L — ABNORMAL LOW (ref 22–32)
Calcium: 8.8 mg/dL — ABNORMAL LOW (ref 8.9–10.3)
Chloride: 111 mmol/L (ref 98–111)
Creatinine, Ser: 2.16 mg/dL — ABNORMAL HIGH (ref 0.44–1.00)
GFR, Estimated: 23 mL/min — ABNORMAL LOW (ref >60)
Glucose, Bld: 147 mg/dL — ABNORMAL HIGH (ref 70–99)
Potassium: 4 mmol/L (ref 3.5–5.1)
Sodium: 138 mmol/L (ref 135–145)

## 2020-04-08 MED ORDER — ONDANSETRON HCL 4 MG PO TABS: 4.0000 mg | ORAL_TABLET | Freq: Four times a day (QID) | ORAL | 0 refills | Status: DC

## 2020-04-08 MED ORDER — MINOCYCLINE HCL 100 MG PO CAPS: 100.0000 mg | ORAL_CAPSULE | Freq: Two times a day (BID) | ORAL | 0 refills | Status: DC

## 2020-04-08 MED ORDER — CEFAZOLIN SODIUM-DEXTROSE 1-4 GM/50ML-% IV SOLN
1.0000 g | Freq: Once | INTRAVENOUS | Status: AC
  Administered 2020-04-08: 1 g via INTRAVENOUS
  Filled 2020-04-08: qty 50

## 2020-04-08 MED ORDER — AMOXICILLIN 500 MG PO CAPS: 500.0000 mg | ORAL_CAPSULE | Freq: Three times a day (TID) | ORAL | 0 refills | Status: DC

## 2020-04-08 NOTE — ED Provider Notes (Signed)
[facility] EMERGENCY DEPARTMENT Provider Note   CSN: [id number] Arrival date & time: 04/08/20  1249     History Chief Complaint  Patient presents with  . Cellulitis    Sabrina Wheeler is a 75 y.o. female.  HPI      Sabrina Wheeler is a 75 y.o. female with history of diabetes, HTN, renal disorder,  who presents to the Emergency Department complaining of redness, swelling and "sores" to both lower legs and ankles. Symptoms have been present for several weeks and she was seen by her PCP this morning and sent here for evaluation.  Patient denies pain, swelling of her legs.  Describes pain to both feet after her caregiver stepped on her feet, but denies pain of her legs.  No history of DVTs.  No chest pain or shortness of breath.  She denies any itching of her legs or feet.  Past Medical History:  Diagnosis Date  . Broken ankle 2008   left   . Diabetes mellitus without complication ([place])   . Hypertension   . Obesity   . Renal disorder    Stage 4 kidney disease    Patient Active Problem List   Diagnosis Date Noted  . Sepsis due to undetermined organism ([place]) 11/30/2018  . Essential hypertension 11/30/2018  . Mixed hyperlipidemia 11/30/2018  . Cellulitis of lower extremity   . Hypokalemia   . Ischemic ulcer ([place]) 11/08/2013  . Atherosclerosis of native arteries of the extremities with ulceration(440.23) 11/08/2013    Past Surgical History:  Procedure Laterality Date  . BILE DUCT STENT PLACEMENT    . INCISIONAL HERNIA REPAIR       OB History   No obstetric history on file.     Family History  Problem Relation Age of Onset  . Cancer Mother   . Heart disease Father   . Heart attack Father     Social History   Tobacco Use  . Smoking status: Never Smoker  . Smokeless tobacco: Never Used  Vaping Use  . Vaping Use: Never used  Substance Use Topics  . Alcohol use: No  . Drug use: No    Home Medications Prior to Admission medications   Medication Sig  Start Date End Date Taking? Authorizing Provider  amLODipine (NORVASC) 10 MG tablet Take 10 mg by mouth daily.    [provider]  aspirin EC 81 MG tablet Take 81 mg by mouth daily.    [provider]  atorvastatin (LIPITOR) 80 MG tablet Take 1 tablet by mouth daily. 09/25/18   [provider]  Calcium Carb-Cholecalciferol (CALCIUM 600+D) 600-800 MG-UNIT TABS Take 1 tablet by mouth at bedtime.    [provider]  carvedilol (COREG) 6.25 MG tablet Take 6.25 mg by mouth 2 (two) times daily with a meal.    [provider]  Cholecalciferol 1000 UNITS capsule Take 1,000 Units by mouth at bedtime.    [provider]  Chromium-Cinnamon 608 043 7514 MCG-MG CAPS Take 1 capsule by mouth daily.    [provider]  Cranberry-Vitamin C-Vitamin E (CRANBERRY PLUS VITAMIN C) 4200-20-3 MG-MG-UNIT CAPS Take 1 capsule by mouth daily.    [provider]  fexofenadine (ALLEGRA) 180 MG tablet Take 180 mg by mouth daily.     [provider]  fluticasone (FLONASE) 50 MCG/ACT nasal spray Place 1 spray into both nostrils at bedtime.    [provider]  gabapentin (NEURONTIN) 100 MG capsule Take 200 mg by mouth 2 (two) times  daily.    [provider]  Glucosamine-Chondroitin (OSTEO BI-FLEX REGULAR STRENGTH PO) Take 1 capsule by mouth 2 (two) times daily.    [provider]  LECITHIN PO Take 1 capsule by mouth daily. 1325mg     [provider]  lisinopril (ZESTRIL) 5 MG tablet Take 5 mg by mouth daily.    [provider]  Multiple Vitamins-Minerals (MULTIVITAMIN WITH MINERALS) tablet Take 1 tablet by mouth daily.    [provider]  OVER THE COUNTER MEDICATION Take 1 tablet by mouth daily. Osteo-Bi Flex with Turmeric    [provider]  pantoprazole (PROTONIX) 40 MG tablet Take 40 mg by mouth daily.    [provider]  Red Yeast Rice 600 MG TABS Take 600 mg by mouth 2 (two) times  daily.    [provider]  SODIUM BICARBONATE PO Take by mouth.    [provider]  torsemide (DEMADEX) 20 MG tablet Take 20 mg by mouth daily.    [provider]  TURMERIC PO Take by mouth daily.    [provider]    Allergies    Macrobid [nitrofurantoin monohyd macro], Sulfamethoxazole-trimethoprim, Doxycycline, and Clindamycin/lincomycin  Review of Systems   Review of Systems  Constitutional: Negative for activity change, appetite change, chills and fever.  Respiratory: Negative for cough, chest tightness and shortness of breath.   Gastrointestinal: Negative for abdominal pain, nausea and vomiting.  Musculoskeletal: Negative for arthralgias and myalgias.  Skin: Positive for color change and rash. Negative for wound.       Redness and crusting lesions of the right lower leg.    Neurological: Negative for dizziness, weakness, numbness and headaches.    Physical Exam Updated Vital Signs BP (!) 160/58   Pulse 62   Temp 98.4 F (36.9 C) (Oral)   Resp 15   Ht 5' (1.524 m)   Wt 81.6 kg   SpO2 97%   BMI 35.13 kg/m   Physical Exam Vitals and nursing note reviewed.  Constitutional:      Appearance: Normal appearance.  HENT:     Head: Normocephalic.  Eyes:     Pupils: Pupils are equal, round, and reactive to light.  Neck:     Thyroid: No thyromegaly.     Meningeal: Kernig's sign absent.  Cardiovascular:     Rate and Rhythm: Normal rate and regular rhythm.     Pulses: Normal pulses.     Comments: Bilateral DP and PT pulses heard with portable doppler Pulmonary:     Effort: Pulmonary effort is normal.     Breath sounds: Normal breath sounds. No wheezing.  Abdominal:     Palpations: Abdomen is soft.     Tenderness: There is no abdominal tenderness. There is no guarding or rebound.  Musculoskeletal:        General: Swelling present. No tenderness. Normal range of motion.     Cervical back: Normal range of motion and neck supple.      Comments: Erythema of the bilateral LE's, right > left.  No tenderness. Mild serous crusting of the web space of the second and third toes, no definite open wound.  Chronic appearing lesion of the plantar foot w/o surrounding erythema or lymphangitis.  See attached photo  Skin:    General: Skin is warm.     Capillary Refill: Capillary refill takes 2 to 3 seconds.     Findings: Erythema present. No rash.  Neurological:     General: No focal deficit present.  Mental Status: She is alert.     Sensory: No sensory deficit.     Motor: No weakness.  Psychiatric:        Mood and Affect: Mood normal.      Pt gave verbal consent for digital image to be stored in medical record.    ED Results / Procedures / Treatments   Labs (all labs ordered are listed, but only abnormal results are displayed) Labs Reviewed  BASIC METABOLIC PANEL - Abnormal; Notable for the following components:      Result Value   CO2 20 (*)    Glucose, Bld 147 (*)    BUN 36 (*)    Creatinine, Ser 2.16 (*)    Calcium 8.8 (*)    GFR, Estimated 23 (*)    All other components within normal limits  CBC WITH DIFFERENTIAL/PLATELET - Abnormal; Notable for the following components:   RBC 3.26 (*)    Hemoglobin 9.8 (*)    HCT 30.4 (*)    All other components within normal limits    EKG None  Radiology US Venous Img Lower Unilateral Right  Result Date: 04/08/2020 CLINICAL DATA:  Swelling and redness EXAM: RIGHT LOWER EXTREMITY VENOUS DOPPLER ULTRASOUND TECHNIQUE: Gray-scale sonography with compression, as well as color and duplex ultrasound, were performed to evaluate the deep venous system(s) from the level of the common femoral vein through the popliteal and proximal calf veins. COMPARISON:  None. FINDINGS: VENOUS Normal compressibility of the common femoral, superficial femoral, and popliteal veins, as well as the visualized calf veins. Visualized portions of profunda femoral vein and great saphenous vein unremarkable.  No filling defects to suggest DVT on grayscale or color Doppler imaging. Doppler waveforms show normal direction of venous flow, normal respiratory plasticity and response to augmentation. Limited views of the contralateral common femoral vein are unremarkable. OTHER There is nonspecific lower extremity edema. Limitations: none IMPRESSION: 1. No DVT. 2. There is nonspecific right lower extremity edema. Electronically Signed   By: Constance Holster M.D.   On: 04/08/2020 17:16    Procedures Procedures   Medications Ordered in ED Medications  ceFAZolin (ANCEF) IVPB 1 g/50 mL premix (has no administration in time range)    ED Course  I have reviewed the triage vital signs and the nursing notes.  Pertinent labs & imaging results that were available during my care of the patient were reviewed by me and considered in my medical decision making (see chart for details).    MDM Rules/Calculators/A&P                          Patient here for evaluation of redness and edema of the bilateral lower extremities with right greater than left.  Seen by PCP earlier today.  Overall she is well-appearing nontoxic.  Vital signs are reassuring. Strong dorsalis pedis and posterior tibial pulses heard with portable Doppler at bedside.  She does have some erythema of the lower extremities and small amount of serous crusted of the webspaces of the toes.  Toes warm and pink.  Cap refill is good.  Venous ultrasound negative for DVT.  Labs without leukocytosis.  Patient has decreased kidney function which appears similar to baseline.  Anemia which also appears to be similar to baseline.  She likely has cellulitis.  No concerning symptoms for sepsis.  She has been treated here with IV antibiotics and I feel that she is appropriate for outpatient therapy with oral amoxicillin and minocycline  to cover for possible MRSA.  Patient and daughter who is at bedside agree to plan and close follow-up with PCP.  Strict return  precautions were discussed.   Final Clinical Impression(s) / ED Diagnoses Final diagnoses:  Cellulitis of right lower extremity    Rx / DC Orders ED Discharge Orders    None       Bufford Lope 04/08/20 2040    Truddie Hidden, MD 04/09/20 1524

## 2020-04-08 NOTE — ED Triage Notes (Signed)
Pt arrives via [place] from her PCP. Pt was sent for concerns of cellulitis to her lower extremities. Pt denies pain or fevers.

## 2020-04-08 NOTE — Discharge Instructions (Addendum)
You likely have an infection in the skin of your lower legs.  You will need to take the antibiotics as directed until you are finished.  Your ultrasound did not show any evidence of a blood clot in your leg.  Elevate your legs when possible.  Follow-up with your primary care provider later this week or early next week for recheck.  Return emergency department if you develop any worsening symptoms

## 2020-08-14 ENCOUNTER — Other Ambulatory Visit: Payer: Self-pay

## 2020-08-14 ENCOUNTER — Encounter (HOSPITAL_COMMUNITY): Payer: Self-pay | Admitting: *Deleted

## 2020-08-14 ENCOUNTER — Emergency Department (HOSPITAL_COMMUNITY)
Admission: EM | Admit: 2020-08-14 | Discharge: 2020-08-14 | Disposition: A | Discharge status: Home / Self Care | Payer: Medicare Other | Attending: Emergency Medicine | Admitting: Emergency Medicine

## 2020-08-14 DIAGNOSIS — N184 Chronic kidney disease, stage 4 (severe): Secondary | ICD-10-CM | POA: Insufficient documentation

## 2020-08-14 DIAGNOSIS — E1122 Type 2 diabetes mellitus with diabetic chronic kidney disease: Secondary | ICD-10-CM | POA: Diagnosis not present

## 2020-08-14 DIAGNOSIS — R21 Rash and other nonspecific skin eruption: Secondary | ICD-10-CM | POA: Diagnosis not present

## 2020-08-14 DIAGNOSIS — Z79899 Other long term (current) drug therapy: Secondary | ICD-10-CM | POA: Diagnosis not present

## 2020-08-14 DIAGNOSIS — Z7982 Long term (current) use of aspirin: Secondary | ICD-10-CM | POA: Insufficient documentation

## 2020-08-14 DIAGNOSIS — I129 Hypertensive chronic kidney disease with stage 1 through stage 4 chronic kidney disease, or unspecified chronic kidney disease: Secondary | ICD-10-CM | POA: Insufficient documentation

## 2020-08-14 MED ORDER — CEPHALEXIN 250 MG PO CAPS: 250.0000 mg | ORAL_CAPSULE | Freq: Every day | ORAL | 0 refills | Status: AC

## 2020-08-14 MED ORDER — CEPHALEXIN 250 MG PO CAPS: 250.0000 mg | ORAL_CAPSULE | Freq: Every day | ORAL | 0 refills | Status: DC

## 2020-08-14 MED ORDER — CEPHALEXIN 250 MG PO CAPS: 250.0000 mg | ORAL_CAPSULE | Freq: Once | ORAL | 0 refills | Status: DC

## 2020-08-14 NOTE — ED Provider Notes (Signed)
[facility] EMERGENCY DEPARTMENT Provider Note   CSN: [id number] Arrival date & time: 08/14/20  1543     History Chief Complaint  Patient presents with   Rash    Sabrina Wheeler is a 75 y.o. female.  HPI  Patient with significant medical history of diabetes, hypertension, obesity, renal disease stage IV presents to the emergency department with chief complaint of rash on her right lower leg.  Patient states she noticed these a couple days ago and have gotten worse.  She denies pain, worsening leg swelling, paresthesia or weakness in lower extremities, she denies systemic infection like fevers or chills.  She denies any recent trauma to the area, states that she has had these in the past and thinks it might be cellulitis.  She denies calf pain, chest pain, shortness of breath, denies recent travels.    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     Family History  Problem Relation Age of Onset   Cancer Mother    Heart disease Father    Heart attack Father     Social History   Tobacco Use   Smoking status: Never   Smokeless tobacco: Never  Vaping Use   Vaping Use: Never used  Substance Use Topics   Alcohol use: No   Drug use: No    Home Medications Prior to Admission medications   Medication Sig Start Date  End Date Taking? Authorizing Provider  amLODipine (NORVASC) 10 MG tablet Take 10 mg by mouth daily.    [provider]  amoxicillin (AMOXIL) 500 MG capsule Take 1 capsule (500 mg total) by mouth 3 (three) times daily. 04/08/20   Triplett, Tammy, PA-C  aspirin EC 81 MG tablet Take 81 mg by mouth daily.    [provider]  atorvastatin (LIPITOR) 80 MG tablet Take 1 tablet by mouth daily. 09/25/18   [provider]  Calcium Carb-Cholecalciferol 600-800 MG-UNIT TABS Take 1 tablet by mouth at bedtime.    [provider]  carvedilol (COREG) 6.25 MG tablet Take 6.25 mg by mouth 2 (two) times daily with a meal.    [provider]  cephALEXin (KEFLEX) 250 MG capsule Take 1 capsule (250 mg total) by mouth daily for 7 days. 08/14/20 08/21/20  Marcello Fennel, PA-C  Cholecalciferol 1000 UNITS capsule Take 1,000 Units by mouth at bedtime.    [provider]  gabapentin (NEURONTIN) 100 MG capsule Take 200 mg by mouth 2 (two) times daily.    [provider]  Glucosamine-Chondroitin (OSTEO BI-FLEX REGULAR STRENGTH PO) Take 1 capsule by mouth 2 (two) times daily.    [provider]  LECITHIN PO Take 1 capsule by mouth daily. 1325mg   [provider]  lisinopril (ZESTRIL) 5 MG tablet Take 5 mg by mouth daily.    [provider]  minocycline (MINOCIN) 100 MG capsule Take 1 capsule (100 mg total) by mouth 2 (two) times daily. 04/08/20   Triplett, Tammy, PA-C  Multiple Vitamins-Minerals (MULTIVITAMIN WITH MINERALS) tablet Take 1 tablet by mouth daily.    [provider]  ondansetron (ZOFRAN) 4 MG tablet Take 1 tablet (4 mg total) by mouth every 6 (six) hours. As needed for nausea and vomiting 04/08/20   Triplett, Tammy, PA-C  torsemide (DEMADEX) 20 MG tablet Take 20 mg by mouth daily.    [provider]    Allergies    Macrobid [nitrofurantoin monohyd macro], Sulfamethoxazole-trimethoprim, Doxycycline, and  Clindamycin/lincomycin  Review of Systems   Review of Systems  Constitutional:  Negative for chills and fever.  HENT:  Negative for congestion.   Respiratory:  Negative for shortness of breath.   Cardiovascular:  Negative for chest pain.  Gastrointestinal:  Negative for abdominal pain, diarrhea and vomiting.  Genitourinary:  Negative for enuresis.  Musculoskeletal:  Negative for back pain.  Skin:  Positive for rash.  Neurological:  Negative for headaches.  Hematological:  Does not bruise/bleed easily.   Physical Exam Updated Vital Signs BP (!) 157/54   Pulse (!) 59   Temp 98.2 F (36.8 C)   Resp 20   SpO2 97%   Physical Exam Vitals and nursing note reviewed.  Constitutional:      General: She is not in acute distress.    Appearance: She is not ill-appearing.  HENT:     Head: Normocephalic and atraumatic.     Nose: No congestion.  Eyes:     Conjunctiva/sclera: Conjunctivae normal.  Cardiovascular:     Rate and Rhythm: Normal rate and regular rhythm.     Pulses: Normal pulses.     Heart sounds: No murmur heard.   No friction rub. No gallop.  Pulmonary:     Effort: No respiratory distress.     Breath sounds: No wheezing, rhonchi or rales.  Musculoskeletal:     Right lower leg: No edema.     Left lower leg: No edema.     Comments: Lower extremities were visualized right leg was slightly larger than the left leg, there was small pustules noted in clusters of 3 around the mid lower leg on the right side, slight surrounding erythema, there is no drainage or discharge present.  Leg was not warm to the touch, nontender to palpation.  She also noted an erythematous macule on the right ankle with skin chafing, areas not warm to the touch, no drainage or discharge present.  Neurovascular fully intact in lower extremities.  There was no calf pain or palpable cords.  Skin:    General: Skin is warm and dry.  Neurological:     Mental Status: She is alert.  Psychiatric:        Mood  and Affect: Mood normal.    ED Results / Procedures / Treatments   Labs (all labs ordered are listed, but only abnormal results are displayed) Labs Reviewed - No data to display  EKG None  Radiology No results found.  Procedures Procedures   Medications Ordered in ED Medications - No data to display  ED Course  I have reviewed the triage vital signs and the nursing notes.  Pertinent labs & imaging results that were available during my care of the patient were reviewed by me and considered in  my medical decision making (see chart for details).    MDM Rules/Calculators/A&P                         Initial impression-patient presents with concerns of rash of right lower leg.  She is alert, does not appear in acute distress, vital signs reassuring.  Work-up-due to well-appearing patient, benign for exam, further lab or imaging not warranted at this time  Rule out-low suspicion for DVT as patient denies calf pain, there is no palpable cords, patient endorsed that her right leg is chronically larger on the right this is documented in her notes  had negative DVTs in the past. Offered DVT study but patient declined.  Low suspicion for PE as patient denies pleuritic chest pain, shortness of breath, vital signs reassuring.  Low suspicion for shingles as rash spans across the midline, also noted on the left leg, presentation is not consistent as she denies electric shock like sensation in the area.  Low suspicion for abscess there is no fluctuant indurations present.  Plan-  Rash on leg-suspect folliculitis but cannot exclude possibility of early cellulitis. due to her lymphedema concern for worsening infection will start on antibiotics renally dosed and have her follow-up with PCP for further evaluation.  Vital signs have remained stable, no indication for hospital admission.  Patient discussed with attending and they agreed with assessment and plan.  Patient given at home care as well  strict return precautions.  Patient verbalized that they understood agreed to said plan.  Final Clinical Impression(s) / ED Diagnoses Final diagnoses:  Rash    Rx / DC Orders ED Discharge Orders          Ordered    cephALEXin (KEFLEX) 250 MG capsule   Once,   Status:  Discontinued        08/14/20 1700    cephALEXin (KEFLEX) 250 MG capsule  Daily,   Status:  Discontinued        08/14/20 1701    cephALEXin (KEFLEX) 250 MG capsule  Daily,   Status:  Discontinued        08/14/20 1701    cephALEXin (KEFLEX) 250 MG capsule  Daily        08/14/20 1704             Marcello Fennel, PA-C 08/14/20 1724    Noemi Chapel, MD 08/15/20 [phone]

## 2020-08-14 NOTE — ED Triage Notes (Signed)
Redness and swelling both legs x 4 days

## 2020-08-14 NOTE — ED Provider Notes (Signed)
Emergency Medicine Provider  Evaluation Note  Sabrina Wheeler , a 76 y.o. female  was evaluated in triage.  Pt complains of rash to her right lower extremity.  Review of Systems  Positive: Rash Negative: Fever  Physical Exam  BP (!) 157/54   Pulse (!) 59   Temp 98.2 F (36.8 C)   Resp 20   SpO2 97%  Gen:   Awake, no distress   Resp:  Normal effort  MSK:   Moves extremities without difficulty  Other:  Skin exam with small pustular lesions to the right lower extremity, 3 or 4 in number, no diffuse underlying redness, there is some dermatitis at the ankle which is different, nontender Neurologic: Speech is clear  Medical Decision Making  Medically screening exam initiated at 4:14 PM.  Appropriate orders placed.  AARON BOEH was informed that the remainder of the evaluation will be completed by another provider, this initial triage assessment does not replace that evaluation, and the importance of remaining in the ED until their evaluation is complete.  Antibiotics, stable  Medical screening examination/treatment/procedure(s) were conducted as a shared visit with non-physician practitioner(s) and myself.  I personally evaluated the patient during the encounter.  Clinical Impression:   Final diagnoses:  Rash         Noemi Chapel, MD 08/15/20 [phone]

## 2020-08-14 NOTE — Discharge Instructions (Addendum)
I suspect you have a skin infection, I started you on antibiotics to be taken as prescribed.  Recommend keeping the area clean, may apply antiseptic ointment on it daily.  Please follow-up with your PCP as needed.  Come back to the emergency department if you develop chest pain, shortness of breath, severe abdominal pain, uncontrolled nausea, vomiting, diarrhea.

## 2020-10-14 ENCOUNTER — Other Ambulatory Visit: Payer: Self-pay

## 2020-10-14 ENCOUNTER — Encounter (HOSPITAL_COMMUNITY): Payer: Self-pay | Admitting: Emergency Medicine

## 2020-10-14 ENCOUNTER — Inpatient Hospital Stay (HOSPITAL_COMMUNITY)
Admission: EM | Admit: 2020-10-14 | Discharge: 2020-10-25 | DRG: 291 | Disposition: A | Discharge status: Home / Self Care | Payer: Medicare Other | Attending: Internal Medicine | Admitting: Internal Medicine

## 2020-10-14 DIAGNOSIS — Z20822 Contact with and (suspected) exposure to covid-19: Secondary | ICD-10-CM | POA: Diagnosis present

## 2020-10-14 DIAGNOSIS — R778 Other specified abnormalities of plasma proteins: Secondary | ICD-10-CM

## 2020-10-14 DIAGNOSIS — I509 Heart failure, unspecified: Secondary | ICD-10-CM

## 2020-10-14 DIAGNOSIS — Z9114 Patient's other noncompliance with medication regimen: Secondary | ICD-10-CM

## 2020-10-14 DIAGNOSIS — Z881 Allergy status to other antibiotic agents status: Secondary | ICD-10-CM

## 2020-10-14 DIAGNOSIS — Z7982 Long term (current) use of aspirin: Secondary | ICD-10-CM

## 2020-10-14 DIAGNOSIS — K222 Esophageal obstruction: Secondary | ICD-10-CM | POA: Diagnosis present

## 2020-10-14 DIAGNOSIS — F0280 Dementia in other diseases classified elsewhere without behavioral disturbance: Secondary | ICD-10-CM | POA: Diagnosis present

## 2020-10-14 DIAGNOSIS — Z6833 Body mass index (BMI) 33.0-33.9, adult: Secondary | ICD-10-CM

## 2020-10-14 DIAGNOSIS — D649 Anemia, unspecified: Secondary | ICD-10-CM

## 2020-10-14 DIAGNOSIS — I130 Hypertensive heart and chronic kidney disease with heart failure and stage 1 through stage 4 chronic kidney disease, or unspecified chronic kidney disease: Secondary | ICD-10-CM | POA: Diagnosis not present

## 2020-10-14 DIAGNOSIS — N184 Chronic kidney disease, stage 4 (severe): Secondary | ICD-10-CM | POA: Diagnosis present

## 2020-10-14 DIAGNOSIS — F05 Delirium due to known physiological condition: Secondary | ICD-10-CM | POA: Diagnosis not present

## 2020-10-14 DIAGNOSIS — E669 Obesity, unspecified: Secondary | ICD-10-CM | POA: Diagnosis present

## 2020-10-14 DIAGNOSIS — G309 Alzheimer's disease, unspecified: Secondary | ICD-10-CM | POA: Diagnosis present

## 2020-10-14 DIAGNOSIS — N189 Chronic kidney disease, unspecified: Secondary | ICD-10-CM

## 2020-10-14 DIAGNOSIS — K5730 Diverticulosis of large intestine without perforation or abscess without bleeding: Secondary | ICD-10-CM | POA: Diagnosis present

## 2020-10-14 DIAGNOSIS — Z8249 Family history of ischemic heart disease and other diseases of the circulatory system: Secondary | ICD-10-CM

## 2020-10-14 DIAGNOSIS — I5041 Acute combined systolic (congestive) and diastolic (congestive) heart failure: Secondary | ICD-10-CM

## 2020-10-14 DIAGNOSIS — R0603 Acute respiratory distress: Secondary | ICD-10-CM | POA: Diagnosis not present

## 2020-10-14 DIAGNOSIS — D509 Iron deficiency anemia, unspecified: Secondary | ICD-10-CM | POA: Diagnosis present

## 2020-10-14 DIAGNOSIS — I10 Essential (primary) hypertension: Secondary | ICD-10-CM | POA: Diagnosis present

## 2020-10-14 DIAGNOSIS — K635 Polyp of colon: Secondary | ICD-10-CM | POA: Diagnosis present

## 2020-10-14 DIAGNOSIS — K640 First degree hemorrhoids: Secondary | ICD-10-CM | POA: Diagnosis present

## 2020-10-14 DIAGNOSIS — K449 Diaphragmatic hernia without obstruction or gangrene: Secondary | ICD-10-CM | POA: Diagnosis present

## 2020-10-14 DIAGNOSIS — D631 Anemia in chronic kidney disease: Secondary | ICD-10-CM | POA: Diagnosis present

## 2020-10-14 DIAGNOSIS — N179 Acute kidney failure, unspecified: Secondary | ICD-10-CM | POA: Diagnosis present

## 2020-10-14 DIAGNOSIS — Z882 Allergy status to sulfonamides status: Secondary | ICD-10-CM

## 2020-10-14 DIAGNOSIS — R079 Chest pain, unspecified: Secondary | ICD-10-CM | POA: Diagnosis present

## 2020-10-14 DIAGNOSIS — J9601 Acute respiratory failure with hypoxia: Secondary | ICD-10-CM

## 2020-10-14 DIAGNOSIS — I248 Other forms of acute ischemic heart disease: Secondary | ICD-10-CM | POA: Diagnosis present

## 2020-10-14 DIAGNOSIS — E1122 Type 2 diabetes mellitus with diabetic chronic kidney disease: Secondary | ICD-10-CM | POA: Diagnosis present

## 2020-10-14 DIAGNOSIS — Z79899 Other long term (current) drug therapy: Secondary | ICD-10-CM

## 2020-10-14 DIAGNOSIS — E782 Mixed hyperlipidemia: Secondary | ICD-10-CM | POA: Diagnosis present

## 2020-10-14 HISTORY — DX: Heart failure, unspecified: I50.9

## 2020-10-14 NOTE — ED Triage Notes (Signed)
Pt c/o intermittent chest pain x one week that she describes as a burning sensation. Pt c/o sob that started today. Ems states o2 on arrival was 85% on room air.

## 2020-10-15 ENCOUNTER — Observation Stay (HOSPITAL_COMMUNITY): Payer: Medicare Other

## 2020-10-15 ENCOUNTER — Encounter: Payer: Medicare Other | Admitting: Vascular Surgery

## 2020-10-15 ENCOUNTER — Encounter (HOSPITAL_COMMUNITY): Payer: Self-pay | Admitting: Family Medicine

## 2020-10-15 ENCOUNTER — Emergency Department (HOSPITAL_COMMUNITY): Payer: Medicare Other

## 2020-10-15 DIAGNOSIS — J9601 Acute respiratory failure with hypoxia: Secondary | ICD-10-CM

## 2020-10-15 DIAGNOSIS — D638 Anemia in other chronic diseases classified elsewhere: Secondary | ICD-10-CM | POA: Insufficient documentation

## 2020-10-15 DIAGNOSIS — I5031 Acute diastolic (congestive) heart failure: Secondary | ICD-10-CM

## 2020-10-15 DIAGNOSIS — N189 Chronic kidney disease, unspecified: Secondary | ICD-10-CM

## 2020-10-15 DIAGNOSIS — N179 Acute kidney failure, unspecified: Secondary | ICD-10-CM | POA: Diagnosis not present

## 2020-10-15 DIAGNOSIS — D649 Anemia, unspecified: Secondary | ICD-10-CM | POA: Diagnosis not present

## 2020-10-15 DIAGNOSIS — E782 Mixed hyperlipidemia: Secondary | ICD-10-CM

## 2020-10-15 DIAGNOSIS — R778 Other specified abnormalities of plasma proteins: Secondary | ICD-10-CM | POA: Diagnosis not present

## 2020-10-15 DIAGNOSIS — I10 Essential (primary) hypertension: Secondary | ICD-10-CM

## 2020-10-15 DIAGNOSIS — R079 Chest pain, unspecified: Secondary | ICD-10-CM | POA: Diagnosis present

## 2020-10-15 DIAGNOSIS — N184 Chronic kidney disease, stage 4 (severe): Secondary | ICD-10-CM

## 2020-10-15 LAB — HEPARIN LEVEL (UNFRACTIONATED)
Heparin Unfractionated: 0.28 IU/mL — ABNORMAL LOW (ref 0.30–0.70)
Heparin Unfractionated: 0.25 IU/mL — ABNORMAL LOW (ref 0.30–0.70)

## 2020-10-15 LAB — CBC WITH DIFFERENTIAL/PLATELET
Abs Immature Granulocytes: 0.06 10*3/uL (ref 0.00–0.07)
Basophils Absolute: 0.1 10*3/uL (ref 0.0–0.1)
Basophils Relative: 0 %
Eosinophils Absolute: 0.3 10*3/uL (ref 0.0–0.5)
Eosinophils Relative: 2 %
HCT: 25.3 % — ABNORMAL LOW (ref 36.0–46.0)
Hemoglobin: 8.1 g/dL — ABNORMAL LOW (ref 12.0–15.0)
Immature Granulocytes: 1 %
Lymphocytes Relative: 7 %
Lymphs Abs: 0.9 10*3/uL (ref 0.7–4.0)
MCH: 30 pg (ref 26.0–34.0)
MCHC: 32 g/dL (ref 30.0–36.0)
MCV: 93.7 fL (ref 80.0–100.0)
Monocytes Absolute: 1 10*3/uL (ref 0.1–1.0)
Monocytes Relative: 8 %
Neutro Abs: 10.8 10*3/uL — ABNORMAL HIGH (ref 1.7–7.7)
Neutrophils Relative %: 82 %
Platelets: 275 10*3/uL (ref 150–400)
RBC: 2.7 MIL/uL — ABNORMAL LOW (ref 3.87–5.11)
RDW: 13.2 % (ref 11.5–15.5)
WBC: 13 10*3/uL — ABNORMAL HIGH (ref 4.0–10.5)
nRBC: 0 % (ref 0.0–0.2)

## 2020-10-15 LAB — ECHOCARDIOGRAM COMPLETE
AR max vel: 1.9 cm2
AV Area VTI: 1.81 cm2
AV Area mean vel: 1.78 cm2
AV Mean grad: 7 mmHg
AV Peak grad: 12.8 mmHg
Ao pk vel: 1.79 m/s
Area-P 1/2: 3.91 cm2
Calc EF: 48.4 %
Height: 62 in
MV VTI: 1.94 cm2
S' Lateral: 4 cm
Single Plane A2C EF: 49.2 %
Single Plane A4C EF: 41.1 %
Weight: 2892.44 oz

## 2020-10-15 LAB — TROPONIN I (HIGH SENSITIVITY)
Troponin I (High Sensitivity): 36 ng/L — ABNORMAL HIGH (ref <18)
Troponin I (High Sensitivity): 102 ng/L (ref <18)

## 2020-10-15 LAB — BASIC METABOLIC PANEL
Anion gap: 7 (ref 5–15)
BUN: 46 mg/dL — ABNORMAL HIGH (ref 8–23)
CO2: 20 mmol/L — ABNORMAL LOW (ref 22–32)
Calcium: 8 mg/dL — ABNORMAL LOW (ref 8.9–10.3)
Chloride: 111 mmol/L (ref 98–111)
Creatinine, Ser: 2.84 mg/dL — ABNORMAL HIGH (ref 0.44–1.00)
GFR, Estimated: 17 mL/min — ABNORMAL LOW (ref >60)
Glucose, Bld: 163 mg/dL — ABNORMAL HIGH (ref 70–99)
Potassium: 4.8 mmol/L (ref 3.5–5.1)
Sodium: 138 mmol/L (ref 135–145)

## 2020-10-15 LAB — IRON AND TIBC
Iron: 16 ug/dL — ABNORMAL LOW (ref 28–170)
Saturation Ratios: 7 % — ABNORMAL LOW (ref 10.4–31.8)
TIBC: 241 ug/dL — ABNORMAL LOW (ref 250–450)
UIBC: 225 ug/dL

## 2020-10-15 LAB — BRAIN NATRIURETIC PEPTIDE: B Natriuretic Peptide: 527 pg/mL — ABNORMAL HIGH (ref 0.0–100.0)

## 2020-10-15 LAB — RESP PANEL BY RT-PCR (FLU A&B, COVID) ARPGX2
Influenza A by PCR: NEGATIVE
Influenza B by PCR: NEGATIVE
SARS Coronavirus 2 by RT PCR: NEGATIVE

## 2020-10-15 LAB — HEMOGLOBIN A1C
Hgb A1c MFr Bld: 5.9 % — ABNORMAL HIGH (ref 4.8–5.6)
Mean Plasma Glucose: 122.63 mg/dL

## 2020-10-15 LAB — LIPID PANEL
Cholesterol: 85 mg/dL (ref 0–200)
HDL: 36 mg/dL — ABNORMAL LOW (ref >40)
LDL Cholesterol: 43 mg/dL (ref 0–99)
Total CHOL/HDL Ratio: 2.4 RATIO
Triglycerides: 29 mg/dL (ref <150)
VLDL: 6 mg/dL (ref 0–40)

## 2020-10-15 LAB — VITAMIN B12: Vitamin B-12: 239 pg/mL (ref 180–914)

## 2020-10-15 LAB — FERRITIN: Ferritin: 132 ng/mL (ref 11–307)

## 2020-10-15 LAB — PROCALCITONIN: Procalcitonin: <0.1 ng/mL

## 2020-10-15 LAB — RETICULOCYTES
Immature Retic Fract: 25 % — ABNORMAL HIGH (ref 2.3–15.9)
RBC.: 2.54 MIL/uL — ABNORMAL LOW (ref 3.87–5.11)
Retic Count, Absolute: 44.2 10*3/uL (ref 19.0–186.0)
Retic Ct Pct: 1.7 % (ref 0.4–3.1)

## 2020-10-15 MED ORDER — FUROSEMIDE 10 MG/ML IJ SOLN
40.0000 mg | Freq: Once | INTRAMUSCULAR | Status: DC
Start: 2020-10-15 — End: 2020-10-15

## 2020-10-15 MED ORDER — FUROSEMIDE 10 MG/ML IJ SOLN
80.0000 mg | Freq: Once | INTRAMUSCULAR | Status: AC
  Administered 2020-10-15: 80 mg via INTRAVENOUS

## 2020-10-15 MED ORDER — FUROSEMIDE 10 MG/ML IJ SOLN
60.0000 mg | Freq: Every day | INTRAMUSCULAR | Status: DC
  Filled 2020-10-15 (×2): qty 6

## 2020-10-15 MED ORDER — ASPIRIN 325 MG PO TABS
325.0000 mg | ORAL_TABLET | Freq: Every day | ORAL | Status: DC
  Administered 2020-10-15 – 2020-10-25 (×9): 325 mg via ORAL
  Filled 2020-10-15 (×10): qty 1

## 2020-10-15 MED ORDER — ASPIRIN EC 81 MG PO TBEC: 81.0000 mg | DELAYED_RELEASE_TABLET | Freq: Every day | ORAL | Status: DC

## 2020-10-15 MED ORDER — NITROGLYCERIN IN D5W 200-5 MCG/ML-% IV SOLN
5.0000 ug/min | INTRAVENOUS | Status: DC
  Administered 2020-10-15 – 2020-10-16 (×2): 5 ug/min via INTRAVENOUS
  Filled 2020-10-15: qty 250

## 2020-10-15 MED ORDER — HEPARIN (PORCINE) 25000 UT/250ML-% IV SOLN
1200.0000 [IU]/h | INTRAVENOUS | Status: DC
  Administered 2020-10-15: 1300 [IU]/h via INTRAVENOUS
  Administered 2020-10-15: 1000 [IU]/h via INTRAVENOUS
  Administered 2020-10-16 – 2020-10-18 (×3): 1200 [IU]/h via INTRAVENOUS
  Filled 2020-10-15 (×5): qty 250

## 2020-10-15 MED ORDER — HEPARIN BOLUS VIA INFUSION
1000.0000 [IU] | Freq: Once | INTRAVENOUS | Status: AC
  Administered 2020-10-15: 1000 [IU] via INTRAVENOUS

## 2020-10-15 MED ORDER — ACETAMINOPHEN 325 MG PO TABS: 650.0000 mg | ORAL_TABLET | ORAL | Status: DC | PRN

## 2020-10-15 MED ORDER — HEPARIN BOLUS VIA INFUSION
3000.0000 [IU] | Freq: Once | INTRAVENOUS | Status: AC
  Administered 2020-10-15: 3000 [IU] via INTRAVENOUS

## 2020-10-15 MED ORDER — SODIUM CHLORIDE 0.9 % IV SOLN: 250.0000 mL | INTRAVENOUS | Status: DC | PRN

## 2020-10-15 MED ORDER — FUROSEMIDE 10 MG/ML IJ SOLN
40.0000 mg | Freq: Once | INTRAMUSCULAR | Status: AC
  Administered 2020-10-15: 40 mg via INTRAVENOUS
  Filled 2020-10-15: qty 4

## 2020-10-15 MED ORDER — CHLORHEXIDINE GLUCONATE CLOTH 2 % EX PADS
6.0000 | MEDICATED_PAD | Freq: Every day | CUTANEOUS | Status: DC
  Administered 2020-10-15 – 2020-10-25 (×7): 6 via TOPICAL

## 2020-10-15 MED ORDER — ONDANSETRON HCL 4 MG/2ML IJ SOLN
4.0000 mg | Freq: Four times a day (QID) | INTRAMUSCULAR | Status: DC | PRN
  Administered 2020-10-17: 4 mg via INTRAVENOUS
  Filled 2020-10-15: qty 2

## 2020-10-15 MED ORDER — SODIUM CHLORIDE 0.9% FLUSH: 3.0000 mL | INTRAVENOUS | Status: DC | PRN

## 2020-10-15 MED ORDER — SODIUM CHLORIDE 0.9% FLUSH
3.0000 mL | Freq: Two times a day (BID) | INTRAVENOUS | Status: DC
  Administered 2020-10-15 – 2020-10-25 (×17): 3 mL via INTRAVENOUS

## 2020-10-15 NOTE — ED Notes (Signed)
Pt sleeping 99% 5LNC.

## 2020-10-15 NOTE — ED Notes (Signed)
Provider called to bedside pt having DIB, lungs sound wet with audible crackles.  Pt skin

## 2020-10-15 NOTE — ED Notes (Addendum)
Date and time results received: 10/15/20 0336 (use smartphrase ".now" to insert current time)  Test: Troponin Critical Value: 102  Name of Provider Notified: Dr. Dina Rich  Orders Received? Or Actions Taken?: Medications ordered.

## 2020-10-15 NOTE — ED Notes (Signed)
Pt is resting at this time with no signs of distress or needs noted.

## 2020-10-15 NOTE — ED Notes (Signed)
Pt ate 85% of dinner.  Pt transported via [facility] to ICU

## 2020-10-15 NOTE — Progress Notes (Signed)
Responded to nursing call:  sob   Subjective:patient became more sob after echo about 30 min ago.  She has some chest tightness.  No n/v/d.   Vitals:   10/15/20 1230 10/15/20 1300 10/15/20 1330 10/15/20 1400  BP: (!) 156/58 (!) 161/90 (!) 150/60 (!) 158/68  Pulse: 65 68 66 69  Resp: (!) 21 (!) 28    Temp:      TempSrc:      SpO2: 97% 96% 99% 96%  Weight:      Height:       CV--RRR Lung--bilateral rhonchi Abd--soft+BS/NT   Assessment/Plan: Acute respiratory failure with hypoxiz -titrate oxygen for saturation >90% -increased oxygen to 4-5 L -give lasix 80 mg IV x 1 -start NTG IV drip at 5 mcg/kg/min -continue IV heparin -personally reviewed ECG--sinus, IVCD, nonspecific ST changes unchanged from 10/14/20 -repeat CXR -check PCT     Orson Eva, DO [facility]

## 2020-10-15 NOTE — ED Notes (Signed)
Pt removed from nonrebreather and placed on 5L [place].  Pt alert and oriented, skin warm and dry, pt states she is feeling some better.

## 2020-10-15 NOTE — Plan of Care (Signed)
  Problem: Acute Rehab PT Goals(only PT should resolve) Goal: Pt Will Go Supine/Side To Sit Outcome: Progressing Flowsheets (Taken 10/15/2020 1807) Pt will go Supine/Side to Sit: with modified independence Goal: Pt Will Go Sit To Supine/Side Outcome: Progressing Flowsheets (Taken 10/15/2020 1807) Pt will go Sit to Supine/Side: with modified independence Goal: Patient Will Transfer Sit To/From Stand Outcome: Progressing Flowsheets (Taken 10/15/2020 1807) Patient will transfer sit to/from stand: with modified independence Goal: Pt Will Ambulate Outcome: Progressing Flowsheets (Taken 10/15/2020 1807) Pt will Ambulate:  50 feet  with supervision  with rolling walker  6:08 PM, 10/15/20 M. Sherlyn Lees, PT, DPT Physical Therapist- [place] Office Number: [phone]

## 2020-10-15 NOTE — ED Notes (Signed)
Pt continues to yell out help.  Explained to the pt that we will be rounding every hour and inform her of her bed status.

## 2020-10-15 NOTE — Progress Notes (Signed)
[place] for heparin Indication: chest pain/ACS  Allergies  Allergen Reactions   Macrobid [Nitrofurantoin Monohyd Macro] Other (See Comments)    Pt states "it paralyzed me"   Sulfamethoxazole-Trimethoprim Other (See Comments)    "paralyzed me"   Doxycycline Nausea And Vomiting   Clindamycin/Lincomycin Rash    Patient Measurements: Height: 5\' 2"  (157.5 cm) Weight: 81.7 kg (180 lb 1.9 oz) IBW/kg (Calculated) : 50.1 Heparin Dosing Weight: 70kg  Vital Signs: Temp: 98.2 F (36.8 C) (08/10 2021) Temp Source: Axillary (08/10 2021) BP: 158/51 (08/10 2000) Pulse Rate: 63 (08/10 2000)  Labs: Recent Labs    10/15/20 0018 10/15/20 0243 10/15/20 1213 10/15/20 2208  HGB 8.1*  --   --   --   HCT 25.3*  --   --   --   PLT 275  --   --   --   HEPARINUNFRC  --   --  0.28* 0.25*  CREATININE 2.84*  --   --   --   TROPONINIHS 36* 102*  --   --      Estimated Creatinine Clearance: 16.7 mL/min (A) (by C-G formula based on SCr of 2.84 mg/dL (H)).  Assessment: 75 y.o. female with chest pain for heparin   Goal of Therapy:  Heparin level 0.3-0.7 units/ml Monitor platelets by anticoagulation protocol: Yes   Plan:  Increase Heparin  1300 units/hr Follow-up am labs.   Phillis Knack, PharmD, BCPS  10/15/2020,11:35 PM

## 2020-10-15 NOTE — H&P (Signed)
History and Physical  Sabrina Wheeler TKW:[id number] DOB: 1945/06/23 DOA: 10/14/2020   PCP: [facility]   Patient coming from: Home  Chief Complaint: chest pain, sob  HPI:  Sabrina Wheeler is a 75 y.o. female with medical history of hypertension, diabetes mellitus, hyperlipidemia, CKD presenting with 2 to 3-day history of shortness of breath and chest pain.  The patient is a difficult historian at best.  She states that she has been having substernal chest pain and shortness of breath for the past 2 to 3 days.  She states that her chest pain occurs even at rest.  She states that she has chronic lower extremity edema but thinks that it is about the same as usual.  She has a nonproductive cough.  She denies any fevers, chills, headache, neck pain, nausea, vomiting or diarrhea.  She has not had any hemoptysis, hematochezia, melena.  The patient states that she normally sleeps in a recliner and has been doing so for the last 2 to 3 years.  She states that she has not taken any prescription medication for the past 3 years.  Apparently, the patient's primary care provider, the patient has not reestablished with any provider since then.  Upon EMS arrival, the patient was noted to have oxygen saturation 85% on room air. In the emergency department, the patient was afebrile and hemodynamically stable with oxygen saturation 90% on room air.  She was placed on 2 L with oxygen saturation up to 100%.  BMP shows sodium 138, potassium 4.8, serum creatinine 2.84.  WBC 13.0, hemoglobin 8.1, platelets 275,000.  Troponin 36>>102.  Chest x-ray showed bilateral patchy opacities.  She was started on IV heparin.  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 -Baseline creatinine 1.9-2.2 -Presented with serum creatinine 2.84 -Suspect patient may have progression of her underlying CKD  Essential hypertension -Has not taken any antihypertensive medications for 3 years -Previously took amlodipine, carvedilol, lisinopril -Monitor with diuresis  Hyperlipidemia -Check lipid panel -Previously took Lipitor  Diabetes mellitus type 2 -Was not previously taking any agents -Check hemoglobin A1c  Left pretibial wound -See pictures below -Wound care consult      Past Medical History:  Diagnosis Date   Broken ankle 2008   left    Diabetes mellitus without complication ([place])    Hypertension    Obesity    Renal disorder    Stage 4 kidney disease   Past Surgical History:  Procedure Laterality Date   BILE DUCT STENT PLACEMENT     INCISIONAL HERNIA REPAIR     Social History:  reports that she has never smoked. She has never used smokeless tobacco. She reports that she does not drink alcohol and does not use drugs.   Family History  Problem Relation Age of Onset   Cancer Mother    Heart disease Father    Heart attack Father      Allergies  Allergen Reactions   Macrobid [Nitrofurantoin Monohyd Macro] Other (See Comments)    Pt states "it paralyzed me"   Sulfamethoxazole-Trimethoprim Other (See Comments)    "paralyzed me"   Doxycycline Nausea And Vomiting   Clindamycin/Lincomycin Rash  Prior to Admission medications   Medication Sig Start Date End Date Taking? Authorizing Provider  amLODipine (NORVASC) 10 MG tablet Take 10 mg by mouth daily.    [provider]  amoxicillin (AMOXIL) 500 MG capsule Take 1 capsule (500 mg total) by mouth 3 (three) times daily. 04/08/20   Triplett, Tammy, PA-C  aspirin EC 81 MG tablet Take 81 mg by mouth daily.    [provider]  atorvastatin (LIPITOR) 80 MG tablet Take 1 tablet by  mouth daily. 09/25/18   [provider]  Calcium Carb-Cholecalciferol 600-800 MG-UNIT TABS Take 1 tablet by mouth at bedtime.    [provider]  carvedilol (COREG) 6.25 MG tablet Take 6.25 mg by mouth 2 (two) times daily with a meal.    [provider]  Cholecalciferol 1000 UNITS capsule Take 1,000 Units by mouth at bedtime.    [provider]  gabapentin (NEURONTIN) 100 MG capsule Take 200 mg by mouth 2 (two) times daily.    [provider]  Glucosamine-Chondroitin (OSTEO BI-FLEX REGULAR STRENGTH PO) Take 1 capsule by mouth 2 (two) times daily.    [provider]  LECITHIN PO Take 1 capsule by mouth daily. 1325mg     [provider]  lisinopril (ZESTRIL) 5 MG tablet Take 5 mg by mouth daily.    [provider]  minocycline (MINOCIN) 100 MG capsule Take 1 capsule (100 mg total) by mouth 2 (two) times daily. 04/08/20   Triplett, Tammy, PA-C  Multiple Vitamins-Minerals (MULTIVITAMIN WITH MINERALS) tablet Take 1 tablet by mouth daily.    [provider]  ondansetron (ZOFRAN) 4 MG tablet Take 1 tablet (4 mg total) by mouth every 6 (six) hours. As needed for nausea and vomiting 04/08/20   Triplett, Tammy, PA-C  torsemide (DEMADEX) 20 MG tablet Take 20 mg by mouth daily.    [provider]    Review of Systems:  Constitutional:  No weight loss, night sweats, Fevers, chills, fatigue.  Head&Eyes: No headache.  No vision loss.  No eye pain or scotoma ENT:  No Difficulty swallowing,Tooth/dental problems,Sore throat,  No ear ache, post nasal drip,  Cardio-vascular:  No Orthopnea, PND. dizziness, palpitations  GI:  No  abdominal pain, nausea, vomiting, diarrhea, loss of appetite, hematochezia, melena, heartburn, indigestion, Resp:  No shortness of breath with exertion or at rest. No cough. No coughing up of blood .No wheezing.No chest wall deformity  Skin:  no rash or lesions.  GU:  no dysuria, change in color of  urine, no urgency or frequency. No flank pain.  Musculoskeletal:  No joint pain or swelling. No decreased range of motion. No back pain.  Psych:  No change in mood or affect. No depression or anxiety. Neurologic: No headache, no dysesthesia, no focal weakness, no vision loss. No syncope  Physical Exam: Vitals:   10/15/20 0400 10/15/20 0430 10/15/20 0500 10/15/20 0700  BP: (!) 147/68 (!) 135/50 (!) 136/53 (!) 142/51  Pulse: 60 (!) 58 (!) 59 (!) 58  Resp: (!) 23 17 14 13   Temp:      TempSrc:      SpO2: 100% 100% 98% 100%  Weight:      Height:       General:  A&O x 3, NAD, nontoxic, pleasant/cooperative Head/Eye: No conjunctival hemorrhage, no icterus, [place]/AT, No nystagmus ENT:  No icterus,  No thrush, good dentition, no pharyngeal exudate Neck:  No masses, no lymphadenpathy, no bruits CV:  RRR, no rub, no gallop, no  S3 Lung:  bibasilar crackles. No wheeze Abdomen: soft/NT, +BS, nondistended, no peritoneal signs Ext: No cyanosis, No rashes, No petechiae, No lymphangitis, 2 +LE edema Neuro: CNII-XII intact, strength 4/5 in bilateral upper and lower extremities, no dysmetria LEFT LEG   RIGHT FOOT    Labs on Admission:  Basic Metabolic Panel: Recent Labs  Lab 10/15/20 0018  NA 138  K 4.8  CL 111  CO2 20*  GLUCOSE 163*  BUN 46*  CREATININE 2.84*  CALCIUM 8.0*   Liver Function Tests: No results for input(s): AST, ALT, ALKPHOS, BILITOT, PROT, ALBUMIN in the last 168 hours. No results for input(s): LIPASE, AMYLASE in the last 168 hours. No results for input(s): AMMONIA in the last 168 hours. CBC: Recent Labs  Lab 10/15/20 0018  WBC 13.0*  NEUTROABS 10.8*  HGB 8.1*  HCT 25.3*  MCV 93.7  PLT 275   Coagulation Profile: No results for input(s): INR, PROTIME in the last 168 hours. Cardiac Enzymes: No results for input(s): CKTOTAL, CKMB, CKMBINDEX, TROPONINI in the last 168 hours. BNP: Invalid input(s): POCBNP CBG: No results for input(s): GLUCAP in the last 168  hours. Urine analysis:    Component Value Date/Time   COLORURINE YELLOW 06/29/2019 0456   APPEARANCEUR CLEAR 06/29/2019 0456   LABSPEC 1.018 06/29/2019 0456   PHURINE 5.0 06/29/2019 0456   GLUCOSEU 50 (A) 06/29/2019 0456   HGBUR NEGATIVE 06/29/2019 0456   BILIRUBINUR NEGATIVE 06/29/2019 0456   KETONESUR NEGATIVE 06/29/2019 0456   PROTEINUR >=300 (A) 06/29/2019 0456   NITRITE NEGATIVE 06/29/2019 0456   LEUKOCYTESUR NEGATIVE 06/29/2019 0456   Sepsis Labs: @LABRCNTIP (procalcitonin:4,lacticidven:4) ) Recent Results (from the past 240 hour(s))  Resp Panel by RT-PCR (Flu A&B, Covid) Nasopharyngeal Swab     Status: None   Collection Time: 10/15/20  3:40 AM   Specimen: Nasopharyngeal Swab; Nasopharyngeal(NP) swabs in vial transport medium  Result Value Ref Range Status   SARS Coronavirus 2 by RT PCR NEGATIVE NEGATIVE Final    Comment: (NOTE) SARS-CoV-2 target nucleic acids are NOT DETECTED.  The SARS-CoV-2 RNA is generally detectable in upper respiratory specimens during the acute phase of infection. The lowest concentration of SARS-CoV-2 viral copies this assay can detect is 138 copies/mL. A negative result does not preclude SARS-Cov-2 infection and should not be used as the sole basis for treatment or other patient management decisions. A negative result may occur with  improper specimen collection/handling, submission of specimen other than nasopharyngeal swab, presence of viral mutation(s) within the areas targeted by this assay, and inadequate number of viral copies(<138 copies/mL). A negative result must be combined with clinical observations, patient history, and epidemiological information. The expected result is Negative.  Fact Sheet for Patients:  EntrepreneurPulse.com.au  Fact Sheet for Healthcare Providers:  IncredibleEmployment.be  This test is no t yet approved or cleared by the [place] FDA and  has been authorized for  detection and/or diagnosis of SARS-CoV-2 by FDA under an Emergency Use Authorization (EUA). This EUA will remain  in effect (meaning this test can be used) for the duration of the COVID-19 declaration under Section 564(b)(1) of the Act, 21 U.S.C.section 360bbb-3(b)(1), unless the authorization is terminated  or revoked sooner.       Influenza A by PCR NEGATIVE NEGATIVE Final   Influenza B by PCR NEGATIVE NEGATIVE Final    Comment: (NOTE) The Xpert Xpress SARS-CoV-2/FLU/RSV plus assay is intended as an aid in the diagnosis of influenza from Nasopharyngeal swab specimens and should not be used as  a sole basis for treatment. Nasal washings and aspirates are unacceptable for Xpert Xpress SARS-CoV-2/FLU/RSV testing.  Fact Sheet for Patients: EntrepreneurPulse.com.au  Fact Sheet for Healthcare Providers: IncredibleEmployment.be  This test is not yet approved or cleared by the [place] FDA and has been authorized for detection and/or diagnosis of SARS-CoV-2 by FDA under an Emergency Use Authorization (EUA). This EUA will remain in effect (meaning this test can be used) for the duration of the COVID-19 declaration under Section 564(b)(1) of the Act, 21 U.S.C. section 360bbb-3(b)(1), unless the authorization is terminated or revoked.  Performed at [facility], [address], [place], [place] [address]      Radiological Exams on Admission: DG Chest 2 View  Result Date: 10/15/2020 CLINICAL DATA:  Chest pain and shortness of breath EXAM: CHEST - 2 VIEW COMPARISON:  11/30/2018 FINDINGS: Cardiac shadow is enlarged but stable. Vascular congestion is noted with patchy airspace disease likely representing parenchymal edema. Small posterior effusions are noted. No pneumothorax is noted. No bony abnormality is seen. IMPRESSION: Changes consistent with CHF with patchy parenchymal edema. Electronically Signed   By: Inez Catalina M.D.   On: 10/15/2020 00:50     EKG: Independently reviewed.sinus, nonspecific ST change    Time spent:60 minutes Code Status:   FULL Family Communication:  No Family at bedside Disposition Plan: expect 2-3 day hospitalization Consults called: cardiology DVT Prophylaxis:IV Heparin     Orson Eva, DO  [facility] Pager [phone]  If 7PM-7AM, please contact night-coverage [email or web address] Password [email or web address] 10/15/2020, 8:01 AM

## 2020-10-15 NOTE — Consult Note (Addendum)
Cardiology Consultation:   Patient ID: Sabrina Wheeler MRN: [id number]; DOB: [birth year]  Admit date: 10/14/2020 Date of Consult: 10/15/2020  PCP:  [facility] Cardiologist:  Carlyle Dolly, MD        Patient Profile:   Sabrina Wheeler is a 75 y.o. female with a hx of diabetes mellitus, type II, renal sufficiency, hypertension, dyslipidemia who is being seen 10/15/2020 for the evaluation of creasing shortness of breath at the request of hospitalist.  History of Present Illness:   Ms. Sabrina Wheeler is a 75 year old woman with Hx of  beingrelatively inactive and has had some degree of shortness of breath for some time but recently has noted increasing dyspnea.  Says several days ago the chair she was sitting on collapsed and she found her self on the floor.  She has some slight injury to her lower extremity but since that time is noticed progressive shortness of breath.  No real chest discomfort.  Occasional palpitations in the past but nothing significant. no recent presyncope or syncope. History of diabetes and dyslipidemia and hypertension on therapy.   Past Medical History:  Diagnosis Date   Broken ankle 2008   left    Diabetes mellitus without complication ([place])    Hypertension    Obesity    Renal disorder    Stage 4 kidney disease    Past Surgical History:  Procedure Laterality Date   BILE DUCT STENT PLACEMENT     INCISIONAL HERNIA REPAIR         Inpatient Medications: Scheduled Meds:  aspirin  325 mg Oral Daily   Continuous Infusions:  heparin 1,000 Units/hr (10/15/20 0714)   PRN Meds:   Allergies:    Allergies  Allergen Reactions   Macrobid [Nitrofurantoin Monohyd Macro] Other (See Comments)    Pt states "it paralyzed me"   Sulfamethoxazole-Trimethoprim Other (See Comments)    "paralyzed me"   Doxycycline Nausea And Vomiting   Clindamycin/Lincomycin Rash    Social History:   Social History    Socioeconomic History   Marital status: Married    Spouse name: Not on file   Number of children: Not on file   Years of education: Not on file   Highest education level: Not on file  Occupational History   Not on file  Tobacco Use   Smoking status: Never   Smokeless tobacco: Never  Vaping Use   Vaping Use: Never used  Substance and Sexual Activity   Alcohol use: No   Drug use: No   Sexual activity: Not on file  Other Topics Concern   Not on file  Social History Narrative   Not on file   Social Determinants of Health   Financial Resource Strain: Not on file  Food Insecurity: Not on file  Transportation Needs: Not on file  Physical Activity: Not on file  Stress: Not on file  Social Connections: Not on file  Intimate Partner Violence: Not on file    Family History:   Patient's father had a heart attack dying in his 50s Family History  Problem Relation Age of Onset   Cancer Mother    Heart disease Father    Heart attack Father      ROS:  Please see the history of present illness.  Complete review of systems obtained positives will be mentioned Mild nonproductive cough.  Present for some time. Occasional stress incontinence. Diffuse arthritic issues Sore on the bottom of her right foot  present for many years and has had work on a done in the remote past but not recently.  Recent injury to her left leg in the shin area.  Has had previous surgery on her left lower leg. All other ROS reviewed and negative.     Physical Exam/Data:   Vitals:   10/15/20 0400 10/15/20 0430 10/15/20 0500 10/15/20 0700  BP: (!) 147/68 (!) 135/50 (!) 136/53 (!) 142/51  Pulse: 60 (!) 58 (!) 59 (!) 58  Resp: (!) 23 17 14 13   Temp:      TempSrc:      SpO2: 100% 100% 98% 100%  Weight:      Height:       No intake or output data in the 24 hours ending 10/15/20 0928 Last 3 Weights 10/14/2020 04/08/2020 06/29/2019  Weight (lbs) 180 lb 12.4 oz 179 lb 14.3 oz 180 lb  Weight (kg) 82 kg 81.6  kg 81.647 kg     Body mass index is 33.06 kg/m.  General:  Well nourished, well developed, in no acute distress appears to be somewhat chronically ill HEENT: normal Lymph: no adenopathy Neck: Mild JV distention at 20 degrees elevation.  Mildly positive HJR. Endocrine:  No thryomegaly Vascular: No carotid bruits; FA pulses 2+ bilaterally without bruits  Cardiac:  normal S1, S2; RRR; soft systolic murmur.  No gallop. Lungs:  clear to auscultation bilaterally, very minor wheezing in the upper respiratory airway type.  Not necessarily in her lungs.  No rhonchi or rales  Abd: soft, nontender, no hepatomegaly  Ext: Mild somewhat hard lower extremity edema. Musculoskeletal:  No deformities, BUE and BLE strength normal and equal Skin: Chronic skin changes both lower extremities with superficial open wound over the left anterior shin and a punctate hard and on the bottom of her right foot Neuro: Speech pattern seems normal.  No focal deficits noted Psych:  Normal affect   EKG:  The EKG was to be opened in the computer. NSR w 1st degree AV block, LAD and mild IVCD Telemetry:  Telemetry was personally reviewed and demonstrates: Sinus rhythm  Relevant CV Studies: CXR Cardiac shadow is enlarged but stable. Vascular congestion is noted with patchy airspace disease likely representing parenchymal edema. Small posterior effusions are noted. No pneumothorax is noted. No bony abnormality is seen.   IMPRESSION: Changes consistent with CHF with patchy parenchymal edema.    Laboratory Data:  High Sensitivity Troponin:   Recent Labs  Lab 10/15/20 0018 10/15/20 0243  TROPONINIHS 36* 102*     Chemistry Recent Labs  Lab 10/15/20 0018  NA 138  K 4.8  CL 111  CO2 20*  GLUCOSE 163*  BUN 46*  CREATININE 2.84*  CALCIUM 8.0*  GFRNONAA 17*  ANIONGAP 7    No results for input(s): PROT, ALBUMIN, AST, ALT, ALKPHOS, BILITOT in the last 168 hours. Hematology Recent Labs  Lab 10/15/20 0018  10/15/20 0243  WBC 13.0*  --   RBC 2.70* 2.54*  HGB 8.1*  --   HCT 25.3*  --   MCV 93.7  --   MCH 30.0  --   MCHC 32.0  --   RDW 13.2  --   PLT 275  --    BNP Recent Labs  Lab 10/15/20 0018  BNP 527.0*    DDimer No results for input(s): DDIMER in the last 168 hours.   Radiology/Studies:  DG Chest 2 View  Result Date: 10/15/2020 CLINICAL DATA:  Chest pain and shortness of breath EXAM:  CHEST - 2 VIEW COMPARISON:  11/30/2018 FINDINGS: Cardiac shadow is enlarged but stable. Vascular congestion is noted with patchy airspace disease likely representing parenchymal edema. Small posterior effusions are noted. No pneumothorax is noted. No bony abnormality is seen. IMPRESSION: Changes consistent with CHF with patchy parenchymal edema. Electronically Signed   By: Inez Catalina M.D.   On: 10/15/2020 00:50     Assessment and Plan:   Congestive heart failure, recent worsening unknown etiology             await the echo to determine whether systolic versus diastolic mechanism 2.  Renal insufficiency, significant, chronic but some recent worsening 3.  Diabetes mellitus type II 4.  Dyslipidemia on therapy 5.  Lower extremity wounds ,right foot area is chronic whereas superficial on left shin is more recent  Recommendation: Diuresis.  Depending on the echo will determine care pathway we will recommend.  She has been on carvedilol, low-dose lisinopril, torsemide and atorvastatin and high-dose.  With her slight worsening of creatinine might consider holding her lisinopril.  I recommend continuation of her carvedilol and atorvastatin dosing.  An SLG T2 type drug that would help volume overload, diabetes as well as theoretically have risk reduction effects would be appropriate. Agree with wound care seeing her for her left shin issue.  I suspect her right foot issue is chronic and would not Do anything acute right now. Await the echo.  Further decision making pending these results. Consider  nephrology consult   For questions or updates, please contact [place] Please consult [email or web address] for contact info under    Signed, Abel Presto, MD  10/15/2020 9:28 AM

## 2020-10-15 NOTE — ED Provider Notes (Addendum)
[facility] EMERGENCY DEPARTMENT Provider Note   CSN: [id number] Arrival date & time: 10/14/20  2330     History Chief Complaint  Patient presents with   Chest Pain    Sabrina Wheeler is a 75 y.o. female.  HPI     This is a 75 year old female with history of diabetes, hypertension, chronic kidney disease who presents with chest discomfort and shortness of breath.  Patient reports that she was at home with her husband when she had radiating pressure-like pain across her chest.  She also reports some shortness of breath.  She at baseline has lower extremity edema for which she is "being treated."  She states that this is at her baseline.  Upon EMS arrival she was noted to have O2 sats of 85%.  She denies any recent fevers, cough, infectious symptoms.  No known COVID contacts or sick exposures.  She denies chest pain right now.  Echo in 2017 with normal EF of 55 to 60%.  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     Family History  Problem Relation Age of Onset   Cancer Mother    Heart disease Father    Heart attack Father     Social History   Tobacco Use   Smoking status: Never   Smokeless tobacco: Never  Vaping Use   Vaping Use: Never used  Substance Use Topics   Alcohol use: No   Drug use: No    Home  Medications Prior to Admission medications   Medication Sig Start Date End Date Taking? Authorizing Provider  amLODipine (NORVASC) 10 MG tablet Take 10 mg by mouth daily.    [provider]  amoxicillin (AMOXIL) 500 MG capsule Take 1 capsule (500 mg total) by mouth 3 (three) times daily. 04/08/20   Triplett, Tammy, PA-C  aspirin EC 81 MG tablet Take 81 mg by mouth daily.    [provider]  atorvastatin (LIPITOR) 80 MG tablet Take 1 tablet by mouth daily. 09/25/18   [provider]  Calcium Carb-Cholecalciferol 600-800 MG-UNIT TABS Take 1 tablet by mouth at bedtime.    [provider]  carvedilol (COREG) 6.25 MG tablet Take 6.25 mg by mouth 2 (two) times daily with a meal.    [provider]  Cholecalciferol 1000 UNITS capsule Take 1,000 Units by mouth at bedtime.    [provider]  gabapentin (NEURONTIN) 100 MG capsule Take 200 mg by mouth 2 (two) times daily.    [provider]  Glucosamine-Chondroitin (OSTEO BI-FLEX REGULAR STRENGTH PO) Take 1 capsule by mouth 2 (two) times daily.    [provider]  LECITHIN PO Take 1 capsule by mouth daily. 1325mg     [provider]  lisinopril (ZESTRIL) 5 MG tablet Take 5 mg by mouth daily.    [provider]  minocycline (MINOCIN) 100 MG capsule Take 1 capsule (100 mg total) by mouth 2 (two) times daily. 04/08/20   Triplett, Tammy, PA-C  Multiple Vitamins-Minerals (MULTIVITAMIN WITH MINERALS) tablet Take 1 tablet by mouth daily.    [provider]  ondansetron (ZOFRAN) 4 MG tablet Take 1 tablet (4 mg total) by mouth every 6 (six) hours. As needed for nausea and vomiting 04/08/20   Triplett, Tammy, PA-C  torsemide (DEMADEX) 20 MG tablet Take 20 mg by mouth daily.    [provider]    Allergies    Macrobid [nitrofurantoin monohyd macro], Sulfamethoxazole-trimethoprim, Doxycycline, and Clindamycin/lincomycin  Review of Systems   Review of Systems   Constitutional:  Negative for fever.  Respiratory:  Positive for chest tightness and shortness of breath.   Cardiovascular:  Positive for chest pain and leg swelling.  Gastrointestinal:  Negative for abdominal pain, nausea and vomiting.  All other systems reviewed and are negative.  Physical Exam Updated Vital Signs BP 138/60   Pulse (!) 58   Temp 97.8 F (36.6 C) (Oral)   Resp 19   Ht 1.575 m (5\' 2" )   Wt 82 kg   SpO2 99%   BMI 33.06 kg/m   Physical Exam Vitals and nursing note reviewed.  Constitutional:      Appearance: She is well-developed. She is obese. She is not ill-appearing.  HENT:     Head: Normocephalic and atraumatic.  Eyes:     Pupils: Pupils are equal, round, and reactive to light.  Cardiovascular:     Rate and Rhythm: Normal rate and regular rhythm.     Heart sounds: Normal heart sounds.  Pulmonary:     Effort: Pulmonary effort is normal. No respiratory distress.     Breath sounds: No wheezing.     Comments: Nasal cannula in place, no respiratory distress noted Abdominal:     General: Bowel sounds are normal.     Palpations: Abdomen is soft.  Musculoskeletal:     Cervical back: Neck supple.     Right lower leg: No tenderness. Edema present.     Left lower leg: No tenderness. Edema present.  Skin:    General: Skin is warm and dry.  Neurological:     Mental Status: She is alert and oriented to person, place, and time.  Psychiatric:        Mood and Affect: Mood normal.    ED Results / Procedures / Treatments   Labs (all labs ordered are listed, but only abnormal results are displayed) Labs Reviewed  CBC WITH DIFFERENTIAL/PLATELET - Abnormal; Notable for the following components:      Result Value   WBC 13.0 (*)    RBC 2.70 (*)    Hemoglobin 8.1 (*)    HCT 25.3 (*)    Neutro Abs 10.8 (*)    All other components within normal limits  BASIC METABOLIC PANEL - Abnormal; Notable for the following components:   CO2 20 (*)    Glucose, Bld 163 (*)     BUN 46 (*)    Creatinine, Ser 2.84 (*)    Calcium 8.0 (*)    GFR, Estimated 17 (*)    All other components within normal limits  BRAIN NATRIURETIC PEPTIDE - Abnormal; Notable for the following components:   B Natriuretic Peptide 527.0 (*)    All other components within normal limits  TROPONIN  I (HIGH SENSITIVITY) - Abnormal; Notable for the following components:   Troponin I (High Sensitivity) 36 (*)    All other components within normal limits  TROPONIN I (HIGH SENSITIVITY) - Abnormal; Notable for the following components:   Troponin I (High Sensitivity) 102 (*)    All other components within normal limits  RESP PANEL BY RT-PCR (FLU A&B, COVID) ARPGX2  HEPARIN LEVEL (UNFRACTIONATED)    EKG EKG Interpretation  Date/Time:  Tuesday October 14 2020 23:43:10 EDT Ventricular Rate:  68 PR Interval:  260 QRS Duration: 107 QT Interval:  443 QTC Calculation: 472 R Axis:   -30 Text Interpretation: Sinus rhythm Prolonged PR interval Left axis deviation Nonspecific repol abnormality, diffuse leads Confirmed by Thayer Jew [phone]) on 10/15/2020 12:29:27 AM  Radiology DG Chest 2 View  Result Date: 10/15/2020 CLINICAL DATA:  Chest pain and shortness of breath EXAM: CHEST - 2 VIEW COMPARISON:  11/30/2018 FINDINGS: Cardiac shadow is enlarged but stable. Vascular congestion is noted with patchy airspace disease likely representing parenchymal edema. Small posterior effusions are noted. No pneumothorax is noted. No bony abnormality is seen. IMPRESSION: Changes consistent with CHF with patchy parenchymal edema. Electronically Signed   By: Inez Catalina M.D.   On: 10/15/2020 00:50    Procedures .Critical Care  Date/Time: 10/15/2020 4:00 AM Performed by: Merryl Hacker, MD Authorized by: Merryl Hacker, MD   Critical care provider statement:    Critical care time (minutes):  40   Critical care was time spent personally by me on the following activities:  Discussions with  consultants, evaluation of patient's response to treatment, examination of patient, ordering and performing treatments and interventions, ordering and review of laboratory studies, ordering and review of radiographic studies, pulse oximetry, re-evaluation of patient's condition, obtaining history from patient or surrogate and review of old charts   Medications Ordered in ED Medications  aspirin tablet 325 mg (has no administration in time range)  heparin ADULT infusion 100 units/mL (25000 units/250mL) (1,000 Units/hr Intravenous New Bag/Given 10/15/20 0403)  furosemide (LASIX) injection 40 mg (40 mg Intravenous Given 10/15/20 0354)  heparin bolus via infusion 3,000 Units (3,000 Units Intravenous Bolus from Bag 10/15/20 0403)    ED Course  I have reviewed the triage vital signs and the nursing notes.  Pertinent labs & imaging results that were available during my care of the patient were reviewed by me and considered in my medical decision making (see chart for details).  Clinical Course as of 10/15/20 0403  Wed Oct 15, 2020  0356 Spoke with Dr. Alfred Levins, cardiology.  Recommends optimizing volume status.  Given patient's creatinine, would not necessarily be ideal candidate for catheterization.  Likely needs repeat echo.  We will plan for admission to the hospitalist. [CH]    Clinical Course User Index [CH] Lavone Weisel, Barbette Hair, MD   MDM Rules/Calculators/A&P                           Patient presents with shortness of breath and chest discomfort.  She is high risk for ACS with multiple risk factors.  She is currently pain-free.  She does appear volume overloaded on exam.  Vital signs are reassuring.  She is not hypoxic.  Work-up initiated.  EKG without acute ischemic change or arrhythmia.  Chest x-ray shows evidence of vascular congestion.  Initial troponin slightly elevated at 36.  Repeat troponin 102.  Patient was started on IV heparin.  She continues to be  chest pain-free although this troponin  profile is concerning given her history.  Creatinine continues to trend upwards.  It is now 2.8.  Most recent in the low twos.  She also has a hemoglobin that is downtrending.  Denies acute bleeding symptoms.  Could be anemia of chronic disease.  Spoke with cardiology.  See clinical course above.  Recommends optimizing volume status, trending troponins.  Patient remains on 2 L of oxygen and is clinically stable.  Can consult cardiology in the morning for further evaluation.  May need admission to [facility] given worsening renal failure and need for cardiology evaluation.  Final Clinical Impression(s) / ED Diagnoses Final diagnoses:  Acute on chronic congestive heart failure, unspecified heart failure type (HCC)  Elevated troponin  Anemia, unspecified type    Rx / DC Orders ED Discharge Orders     None        Aakash Hollomon, Barbette Hair, MD 10/15/20 0400    Merryl Hacker, MD 10/15/20 [phone]

## 2020-10-15 NOTE — ED Notes (Signed)
Pt yelling for help.  This nurse entered room.  Call bell in

## 2020-10-15 NOTE — Consult Note (Signed)
WOC Nurse Consult Note: Patient receiving care in [facility] ED 18. Consult completed remotely after review of record and images of wounds. Reason for Consult: leg wound Wound type: left pretibial partial thickness wound Pressure Injury POA: Yes/No/NA Measurement: To be provided by the bedside RN in the flowsheet section  Wound bed: see photo Drainage (amount, consistency, odor)  Periwound: intact Dressing procedure/placement/frequency: Gently cleanse wound to LLE with soap and water, pat dry. Place a Xeroform gauze over the wound, top with an ABD pad. Beginning behind the toes and going to just below the knees, spiral wrap kerlex, then 4 inch ace wrap. Perform daily.    For the calloused area on the plantar surface of the right foot, I have ordered twice daily washing with soap and water and application of Iodine and dry gauze.  [facility] nurse will not follow at this time.  Please re-consult the [place] team if needed.  Val Riles, RN, MSN, CWOCN, CNS-BC, pager [phone]

## 2020-10-15 NOTE — Progress Notes (Signed)
ANTICOAGULATION CONSULT NOTE - Initial Consult  Pharmacy Consult for heparin Indication: chest pain/ACS  Allergies  Allergen Reactions   Macrobid [Nitrofurantoin Monohyd Macro] Other (See Comments)    Pt states "it paralyzed me"   Sulfamethoxazole-Trimethoprim Other (See Comments)    "paralyzed me"   Doxycycline Nausea And Vomiting   Clindamycin/Lincomycin Rash    Patient Measurements: Height: 5\' 2"  (157.5 cm) Weight: 82 kg (180 lb 12.4 oz) IBW/kg (Calculated) : 50.1 Heparin Dosing Weight: 70kg  Vital Signs: BP: 161/90 (08/10 1300) Pulse Rate: 68 (08/10 1300)  Labs: Recent Labs    10/15/20 0018 10/15/20 0243 10/15/20 1213  HGB 8.1*  --   --   HCT 25.3*  --   --   PLT 275  --   --   HEPARINUNFRC  --   --  0.28*  CREATININE 2.84*  --   --   TROPONINIHS 36* 102*  --      Estimated Creatinine Clearance: 16.7 mL/min (A) (by C-G formula based on SCr of 2.84 mg/dL (H)).   Medical History: Past Medical History:  Diagnosis Date   Broken ankle 2008   left    CHF (congestive heart failure) (HCC)    Diabetes mellitus without complication (HCC)    Hypertension    Obesity    Renal disorder    Stage 4 kidney disease    Assessment: 75yo female c/o intermittent burning CP x1wk, troponin elevated and rising, to begin heparin. Troponin 102 this AM HL 0.28, slightly subtherapeutic  Goal of Therapy:  Heparin level 0.3-0.7 units/ml Monitor platelets by anticoagulation protocol: Yes   Plan:  Heparin 1000 units IV bolus  and increase infusion 1150 units/hr  Monitor heparin levels  in ~8 hours and daily while on heparin  CBC daily Monitor for S/S of bleeding  Isac Sarna, BS Vena [place], BCPS Clinical Pharmacist Pager [phone] 10/15/2020,1:23 PM

## 2020-10-15 NOTE — Progress Notes (Signed)
ANTICOAGULATION CONSULT NOTE - Initial Consult  Pharmacy Consult for heparin Indication: chest pain/ACS  Allergies  Allergen Reactions   Macrobid [Nitrofurantoin Monohyd Macro] Other (See Comments)    Pt states "it paralyzed me"   Sulfamethoxazole-Trimethoprim Other (See Comments)    "paralyzed me"   Doxycycline Nausea And Vomiting   Clindamycin/Lincomycin Rash    Patient Measurements: Height: 5\' 2"  (157.5 cm) Weight: 82 kg (180 lb 12.4 oz) IBW/kg (Calculated) : 50.1 Heparin Dosing Weight: 70kg  Vital Signs: Temp: 97.8 F (36.6 C) (08/09 2339) Temp Source: Oral (08/09 2339) BP: 138/60 (08/10 0300) Pulse Rate: 58 (08/10 0300)  Labs: Recent Labs    10/15/20 0018 10/15/20 0243  HGB 8.1*  --   HCT 25.3*  --   PLT 275  --   CREATININE 2.84*  --   TROPONINIHS 36* 102*    Estimated Creatinine Clearance: 16.7 mL/min (A) (by C-G formula based on SCr of 2.84 mg/dL (H)).   Medical History: Past Medical History:  Diagnosis Date   Broken ankle 2008   left    Diabetes mellitus without complication (HCC)    Hypertension    Obesity    Renal disorder    Stage 4 kidney disease    Assessment: 75yo female c/o intermittent burning CP x1wk, troponin elevated and rising, to begin heparin.  Goal of Therapy:  Heparin level 0.3-0.7 units/ml Monitor platelets by anticoagulation protocol: Yes   Plan:  Heparin 3000 units IV bolus x1 followed by infusion at 1000 units/hr and monitor heparin levels and CBC.  Wynona Neat, PharmD, BCPS  10/15/2020,3:43 AM

## 2020-10-15 NOTE — Evaluation (Signed)
Physical Therapy Evaluation Patient Details Name: Sabrina Wheeler MRN: [id number] DOB: [birth year] Today's Date: 10/15/2020   History of Present Illness  Sabrina Wheeler is a 75 y.o. female with medical history of hypertension, diabetes mellitus, hyperlipidemia, CKD presenting with 2 to 3-day history of shortness of breath and chest pain.  The patient is a difficult historian at best.  She states that she has been having substernal chest pain and shortness of breath for the past 2 to 3 days.  She states that her chest pain occurs even at rest.  She states that she has chronic lower extremity edema but thinks that it is about the same as usual.  She has a nonproductive cough.  She denies any fevers, chills, headache, neck pain, nausea, vomiting or diarrhea.  She has not had any hemoptysis, hematochezia, melena.  The patient states that she normally sleeps in a recliner and has been doing so for the last 2 to 3 years.  She states that she has not taken any prescription medication for the past 3 years.  Apparently, the patient's primary care provider, the patient has not reestablished with any provider since then.  Upon EMS arrival, the patient was noted to have oxygen saturation 85% on room air.  In the emergency department, the patient was afebrile and hemodynamically stable with oxygen saturation 90% on room air.  She was placed on 2 L with oxygen saturation up to 100%.  BMP shows sodium 138, potassium 4.8, serum creatinine 2.84.  WBC 13.0, hemoglobin 8.1, platelets 275,000.  Troponin 36>>102.  Chest x-ray showed bilateral patchy opacities.  She was started on IV heparin.   Clinical Impression   Patient agreeable to PT evaluation and initiated tx with monitoring vital signs which reveal normal limits. Min assist mobility performed from supine to sitting EOB and preparing for sit to stand transfers.  It was at this point pt indicated increased chest and left UE pain/pressure and EKG alarm indictes  "multi-focal PVC" and pt with increased SOB.  Pt return to supine position with min A. NSG staff present and report new onset of symptoms.  Assessment and treatment to resume once pt medically stable.     Follow Up Recommendations  (To be determined during hospital course)    Equipment Recommendations       Recommendations for Other Services       Precautions / Restrictions        Mobility  Bed Mobility Overal bed mobility: Needs Assistance Bed Mobility: Rolling;Supine to Sit;Sit to Supine Rolling: Min assist   Supine to sit: Min assist Sit to supine: Min assist        Transfers                 General transfer comment: once sitting EOB pt reports feeling chest pain and LUE pain/pressure and EKG monitor reports multifocal PVC and return to supine. NSG staff reports new onset of these symptoms and assessment stopped at this time  Ambulation/Gait                Stairs            Wheelchair Mobility    Modified Rankin (Stroke Patients Only)       Balance  Pertinent Vitals/Pain Pain Assessment: No/denies pain    Home Living Family/patient expects to be discharged to:: Private residence Living Arrangements: Spouse/significant other Available Help at Discharge: Family Type of Home: House Home Access: Stairs to enter Entrance Stairs-Rails: None Entrance Stairs-Number of Steps: 1 Home Layout: One level;Able to live on main level with bedroom/bathroom Home Equipment: Gilford Rile - 2 wheels;Cane - single point      Prior Function Level of Independence: Independent with assistive device(s)               Hand Dominance        Extremity/Trunk Assessment        Lower Extremity Assessment Lower Extremity Assessment: Generalized weakness       Communication   Communication: No difficulties  Cognition Arousal/Alertness: Awake/alert Behavior During Therapy: WFL for tasks  assessed/performed Overall Cognitive Status: Impaired/Different from baseline Area of Impairment: Orientation                 Orientation Level: Time;Situation                    General Comments      Exercises     Assessment/Plan    PT Assessment Patient needs continued PT services  PT Problem List Decreased strength;Decreased activity tolerance;Cardiopulmonary status limiting activity (unable to complete full assessment due to medical status)       PT Treatment Interventions Gait training;Functional mobility training;Therapeutic activities;Therapeutic exercise;Stair training;Balance training;Patient/family education    PT Goals (Current goals can be found in the Care Plan section)  Acute Rehab PT Goals Patient Stated Goal: none stated PT Goal Formulation: With patient Time For Goal Achievement: 10/22/20 Potential to Achieve Goals: Good    Frequency Min 3X/week   Barriers to discharge Decreased caregiver support pt reports she lives with her spouse. Pt does not seem to be very accurate/reliable historian    Co-evaluation               AM-PAC PT "6 Clicks" Mobility  Outcome Measure Help needed turning from your back to your side while in a flat bed without using bedrails?: A Little Help needed moving from lying on your back to sitting on the side of a flat bed without using bedrails?: A Little Help needed moving to and from a bed to a chair (including a wheelchair)?: A Little       6 Click Score: 9    End of Session     Patient left: in bed;with nursing/sitter in room Nurse Communication: Mobility status;Other (comment) (intent to see pt once medically more stable) PT Visit Diagnosis: Muscle weakness (generalized) (M62.81);Other abnormalities of gait and mobility (R26.89)    Time: 4401-0272 PT Time Calculation (min) (ACUTE ONLY): 15 min   Charges:   PT Evaluation $PT Eval Moderate Complexity: 1 Mod         6:06 PM, 10/15/20 M.  Sherlyn Lees, PT, DPT Physical Therapist- [place] Office Number: [phone]

## 2020-10-15 NOTE — ED Notes (Signed)
Pt meal tray set up for pt.

## 2020-10-15 NOTE — ED Notes (Signed)
Pt had a BM, large and soft.  Pt Cleansed, New [place] applied, new linen, pads and diaper applied. Pt dinner set up.  Pt sitting up eating with out issues.  Pt has no complaints at this time.  Pt eating call bell in reach.

## 2020-10-15 NOTE — ED Notes (Signed)
Pt placed on nitro drip 82mcg per provider and given 80mg  lasix,  Pt placed on nonrebreather for comfort at 15L.

## 2020-10-15 NOTE — Progress Notes (Signed)
  Echocardiogram 2D Echocardiogram has been performed.  Sabrina Wheeler 10/15/2020, 2:26 PM

## 2020-10-16 DIAGNOSIS — E782 Mixed hyperlipidemia: Secondary | ICD-10-CM | POA: Diagnosis present

## 2020-10-16 DIAGNOSIS — I10 Essential (primary) hypertension: Secondary | ICD-10-CM | POA: Diagnosis not present

## 2020-10-16 DIAGNOSIS — Z882 Allergy status to sulfonamides status: Secondary | ICD-10-CM | POA: Diagnosis not present

## 2020-10-16 DIAGNOSIS — Z20822 Contact with and (suspected) exposure to covid-19: Secondary | ICD-10-CM | POA: Diagnosis not present

## 2020-10-16 DIAGNOSIS — E669 Obesity, unspecified: Secondary | ICD-10-CM | POA: Diagnosis present

## 2020-10-16 DIAGNOSIS — I248 Other forms of acute ischemic heart disease: Secondary | ICD-10-CM | POA: Diagnosis present

## 2020-10-16 DIAGNOSIS — I509 Heart failure, unspecified: Secondary | ICD-10-CM | POA: Diagnosis not present

## 2020-10-16 DIAGNOSIS — Z8249 Family history of ischemic heart disease and other diseases of the circulatory system: Secondary | ICD-10-CM | POA: Diagnosis not present

## 2020-10-16 DIAGNOSIS — I5041 Acute combined systolic (congestive) and diastolic (congestive) heart failure: Secondary | ICD-10-CM | POA: Diagnosis not present

## 2020-10-16 DIAGNOSIS — G309 Alzheimer's disease, unspecified: Secondary | ICD-10-CM | POA: Diagnosis not present

## 2020-10-16 DIAGNOSIS — D649 Anemia, unspecified: Secondary | ICD-10-CM | POA: Diagnosis not present

## 2020-10-16 DIAGNOSIS — D631 Anemia in chronic kidney disease: Secondary | ICD-10-CM | POA: Diagnosis not present

## 2020-10-16 DIAGNOSIS — E1122 Type 2 diabetes mellitus with diabetic chronic kidney disease: Secondary | ICD-10-CM | POA: Diagnosis not present

## 2020-10-16 DIAGNOSIS — J9601 Acute respiratory failure with hypoxia: Secondary | ICD-10-CM | POA: Diagnosis not present

## 2020-10-16 DIAGNOSIS — Z6833 Body mass index (BMI) 33.0-33.9, adult: Secondary | ICD-10-CM | POA: Diagnosis not present

## 2020-10-16 DIAGNOSIS — F05 Delirium due to known physiological condition: Secondary | ICD-10-CM | POA: Diagnosis not present

## 2020-10-16 DIAGNOSIS — N189 Chronic kidney disease, unspecified: Secondary | ICD-10-CM | POA: Diagnosis not present

## 2020-10-16 DIAGNOSIS — K640 First degree hemorrhoids: Secondary | ICD-10-CM | POA: Diagnosis present

## 2020-10-16 DIAGNOSIS — Z881 Allergy status to other antibiotic agents status: Secondary | ICD-10-CM | POA: Diagnosis not present

## 2020-10-16 DIAGNOSIS — K449 Diaphragmatic hernia without obstruction or gangrene: Secondary | ICD-10-CM | POA: Diagnosis present

## 2020-10-16 DIAGNOSIS — F0280 Dementia in other diseases classified elsewhere without behavioral disturbance: Secondary | ICD-10-CM | POA: Diagnosis not present

## 2020-10-16 DIAGNOSIS — I130 Hypertensive heart and chronic kidney disease with heart failure and stage 1 through stage 4 chronic kidney disease, or unspecified chronic kidney disease: Secondary | ICD-10-CM | POA: Diagnosis not present

## 2020-10-16 DIAGNOSIS — N184 Chronic kidney disease, stage 4 (severe): Secondary | ICD-10-CM | POA: Diagnosis not present

## 2020-10-16 DIAGNOSIS — K222 Esophageal obstruction: Secondary | ICD-10-CM | POA: Diagnosis not present

## 2020-10-16 DIAGNOSIS — N179 Acute kidney failure, unspecified: Secondary | ICD-10-CM | POA: Diagnosis not present

## 2020-10-16 DIAGNOSIS — R0603 Acute respiratory distress: Secondary | ICD-10-CM | POA: Diagnosis present

## 2020-10-16 DIAGNOSIS — K635 Polyp of colon: Secondary | ICD-10-CM | POA: Diagnosis present

## 2020-10-16 DIAGNOSIS — D509 Iron deficiency anemia, unspecified: Secondary | ICD-10-CM | POA: Diagnosis present

## 2020-10-16 DIAGNOSIS — K5730 Diverticulosis of large intestine without perforation or abscess without bleeding: Secondary | ICD-10-CM | POA: Diagnosis present

## 2020-10-16 DIAGNOSIS — R778 Other specified abnormalities of plasma proteins: Secondary | ICD-10-CM | POA: Diagnosis not present

## 2020-10-16 DIAGNOSIS — D124 Benign neoplasm of descending colon: Secondary | ICD-10-CM | POA: Diagnosis not present

## 2020-10-16 LAB — URINALYSIS, COMPLETE (UACMP) WITH MICROSCOPIC
Bilirubin Urine: NEGATIVE
Glucose, UA: 50 mg/dL — AB
Hgb urine dipstick: NEGATIVE
Ketones, ur: NEGATIVE mg/dL
Leukocytes,Ua: NEGATIVE
Nitrite: NEGATIVE
Protein, ur: 100 mg/dL — AB
Specific Gravity, Urine: 1.006 (ref 1.005–1.030)
pH: 5 (ref 5.0–8.0)

## 2020-10-16 LAB — BASIC METABOLIC PANEL
Anion gap: 9 (ref 5–15)
BUN: 49 mg/dL — ABNORMAL HIGH (ref 8–23)
CO2: 20 mmol/L — ABNORMAL LOW (ref 22–32)
Calcium: 8.5 mg/dL — ABNORMAL LOW (ref 8.9–10.3)
Chloride: 109 mmol/L (ref 98–111)
Creatinine, Ser: 2.78 mg/dL — ABNORMAL HIGH (ref 0.44–1.00)
GFR, Estimated: 17 mL/min — ABNORMAL LOW (ref >60)
Glucose, Bld: 104 mg/dL — ABNORMAL HIGH (ref 70–99)
Potassium: 4.5 mmol/L (ref 3.5–5.1)
Sodium: 138 mmol/L (ref 135–145)

## 2020-10-16 LAB — HEPARIN LEVEL (UNFRACTIONATED)
Heparin Unfractionated: 0.45 IU/mL (ref 0.30–0.70)
Heparin Unfractionated: 0.72 IU/mL — ABNORMAL HIGH (ref 0.30–0.70)
Heparin Unfractionated: 0.42 IU/mL (ref 0.30–0.70)

## 2020-10-16 LAB — CBC
HCT: 26.4 % — ABNORMAL LOW (ref 36.0–46.0)
Hemoglobin: 8.3 g/dL — ABNORMAL LOW (ref 12.0–15.0)
MCH: 29.4 pg (ref 26.0–34.0)
MCHC: 31.4 g/dL (ref 30.0–36.0)
MCV: 93.6 fL (ref 80.0–100.0)
Platelets: 286 10*3/uL (ref 150–400)
RBC: 2.82 MIL/uL — ABNORMAL LOW (ref 3.87–5.11)
RDW: 13 % (ref 11.5–15.5)
WBC: 11.8 10*3/uL — ABNORMAL HIGH (ref 4.0–10.5)
nRBC: 0 % (ref 0.0–0.2)

## 2020-10-16 LAB — TROPONIN I (HIGH SENSITIVITY): Troponin I (High Sensitivity): 243 ng/L (ref <18)

## 2020-10-16 LAB — PROCALCITONIN: Procalcitonin: 0.35 ng/mL

## 2020-10-16 LAB — MRSA NEXT GEN BY PCR, NASAL: MRSA by PCR Next Gen: NOT DETECTED

## 2020-10-16 MED ORDER — CARVEDILOL 12.5 MG PO TABS
12.5000 mg | ORAL_TABLET | Freq: Two times a day (BID) | ORAL | Status: DC
  Administered 2020-10-16 – 2020-10-24 (×15): 12.5 mg via ORAL
  Filled 2020-10-16 (×16): qty 1

## 2020-10-16 MED ORDER — CARVEDILOL 3.125 MG PO TABS: 6.2500 mg | ORAL_TABLET | Freq: Two times a day (BID) | ORAL | Status: DC

## 2020-10-16 MED ORDER — HYDRALAZINE HCL 25 MG PO TABS
25.0000 mg | ORAL_TABLET | Freq: Three times a day (TID) | ORAL | Status: DC
  Administered 2020-10-16 – 2020-10-25 (×25): 25 mg via ORAL
  Filled 2020-10-16 (×26): qty 1

## 2020-10-16 MED ORDER — LIVING BETTER WITH HEART FAILURE BOOK
Freq: Once | Status: AC
  Administered 2020-10-16: 1

## 2020-10-16 MED ORDER — ATORVASTATIN CALCIUM 40 MG PO TABS
80.0000 mg | ORAL_TABLET | Freq: Every day | ORAL | Status: DC
  Administered 2020-10-16 – 2020-10-25 (×9): 80 mg via ORAL
  Filled 2020-10-16 (×9): qty 2

## 2020-10-16 MED ORDER — SODIUM CHLORIDE 0.9 % IV SOLN
1.0000 g | INTRAVENOUS | Status: DC
  Administered 2020-10-16 – 2020-10-21 (×6): 1 g via INTRAVENOUS
  Filled 2020-10-16 (×6): qty 10

## 2020-10-16 MED ORDER — FUROSEMIDE 10 MG/ML IJ SOLN
60.0000 mg | Freq: Two times a day (BID) | INTRAMUSCULAR | Status: DC
  Administered 2020-10-16 – 2020-10-17 (×4): 60 mg via INTRAVENOUS
  Filled 2020-10-16 (×4): qty 6

## 2020-10-16 MED ORDER — AZITHROMYCIN 250 MG PO TABS
500.0000 mg | ORAL_TABLET | Freq: Every day | ORAL | Status: DC
  Administered 2020-10-16 – 2020-10-21 (×6): 500 mg via ORAL
  Filled 2020-10-16 (×6): qty 2

## 2020-10-16 NOTE — Progress Notes (Signed)
[place] for heparin Indication: chest pain/ACS  Allergies  Allergen Reactions   Macrobid [Nitrofurantoin Monohyd Macro] Other (See Comments)    Pt states "it paralyzed me"   Sulfamethoxazole-Trimethoprim Other (See Comments)    "paralyzed me"   Doxycycline Nausea And Vomiting   Clindamycin/Lincomycin Rash    Patient Measurements: Height: 5\' 2"  (157.5 cm) Weight: 80.8 kg (178 lb 2.1 oz) IBW/kg (Calculated) : 50.1 Heparin Dosing Weight: 70kg  Vital Signs: Temp: 97.8 F (36.6 C) (08/11 1755) Temp Source: Oral (08/11 1755) BP: 138/46 (08/11 2000) Pulse Rate: 57 (08/11 2000)  Labs: Recent Labs    10/15/20 0018 10/15/20 0243 10/15/20 1213 10/16/20 0344 10/16/20 0844 10/16/20 1242 10/16/20 2213  HGB 8.1*  --   --  8.3*  --   --   --   HCT 25.3*  --   --  26.4*  --   --   --   PLT 275  --   --  286  --   --   --   HEPARINUNFRC  --   --    < > 0.45  --  0.72* 0.42  CREATININE 2.84*  --   --  2.78*  --   --   --   TROPONINIHS 36* 102*  --   --  243*  --   --    < > = values in this interval not displayed.     Estimated Creatinine Clearance: 17 mL/min (A) (by C-G formula based on SCr of 2.78 mg/dL (H)).  Assessment: 75 y.o. female with chest pain for heparin   Goal of Therapy:  Heparin level 0.3-0.7 units/ml Monitor platelets by anticoagulation protocol: Yes   Plan:  Continue Heparin at current rate   Phillis Knack, PharmD, BCPS  10/16/2020,11:40 PM

## 2020-10-16 NOTE — Progress Notes (Signed)
Pt having increase work of breathing and SOB, along with O2 sat's decreasing as low as 82%. Pt started out the morning on 2L of O2. Has progressively had to move up to 12L HFNC due to O2 sat's and work of breathing. Pt also re-started on Nitro gtt due to chest pain. MD and RT made aware, will continue to monitor.

## 2020-10-16 NOTE — TOC Initial Note (Signed)
Transition of Care [facility]) - Initial/Assessment Note    Patient Details  Name: Sabrina Wheeler MRN: [id number] Date of Birth: [birth year]  Transition of Care [facility]) CM/SW Contact:    Boneta Lucks, RN Phone Number: 10/16/2020, 1:36 PM  Clinical Narrative:   Patient admitted with chest pain.  Patient lives at home with her husband. TOC consulted for CHF. TOC took Living Better with CHF book and explained to patient. She does not do daily weights. She does not eat salt.  She states walks with a quad cane to the bathroom, but mostly sits all day. Her family does not want her moving around a lot. She feels like she can do more. They have a friend that comes by daily to assist in the home. Her husband has an injury now and needs assistance. They rely on family or scheduled Lucianne Lei to get out of the house. TOC to follow for DC needs.             Expected Discharge Plan: Home/Self Care Barriers to Discharge: Continued Medical Work up  Patient Goals and CMS Choice Patient states their goals for this hospitalization and ongoing recovery are:: to go home. CMS Medicare.gov Compare Post Acute Care list provided to:: Patient Choice offered to / list presented to : Patient  Expected Discharge Plan and Services Expected Discharge Plan: Home/Self Care      Living arrangements for the past 2 months: Single Family Home        Prior Living Arrangements/Services Living arrangements for the past 2 months: Single Family Home Lives with:: Spouse          Need for Family Participation in Patient Care: Yes (Comment) Care giver support system in place?: Yes (comment) Current home services: DME, Other (comment) (private aide) Criminal Activity/Legal Involvement Pertinent to Current Situation/Hospitalization: No - Comment as needed  Activities of Daily Living Home Assistive Devices/Equipment: Eyeglasses, Cane (specify quad or straight) ADL Screening (condition at time of admission) Patient's cognitive  ability adequate to safely complete daily activities?: Yes Is the patient deaf or have difficulty hearing?: No Does the patient have difficulty seeing, even when wearing glasses/contacts?: No Does the patient have difficulty concentrating, remembering, or making decisions?: No Patient able to express need for assistance with ADLs?: Yes Does the patient have difficulty dressing or bathing?: No Independently performs ADLs?: Yes (appropriate for developmental age) Does the patient have difficulty walking or climbing stairs?: Yes Weakness of Legs: Both Weakness of Arms/Hands: None    Emotional Assessment    Affect (typically observed): Accepting, Pleasant Orientation: : Oriented to Self, Oriented to Place, Oriented to  Time, Oriented to Situation Alcohol / Substance Use: Not Applicable Psych Involvement: No (comment)  Admission diagnosis:  Respiratory distress [R06.03] Elevated troponin [R77.8] Acute respiratory failure with hypoxia (HCC) [J96.01] Chest pain [R07.9] Anemia, unspecified type [D64.9] Acute on chronic congestive heart failure, unspecified heart failure type [facility]) [I50.9] Patient Active Problem List   Diagnosis Date Noted   Systolic and diastolic CHF, acute ([place]) 10/16/2020   Chest pain 10/15/2020   Acute renal failure superimposed on stage 4 chronic kidney disease ([place]) 10/15/2020   Acute respiratory failure with hypoxia (HCC) 10/15/2020   Anemia    Elevated troponin    Sepsis due to undetermined organism ([place]) 11/30/2018   Essential hypertension 11/30/2018   Mixed hyperlipidemia 11/30/2018   Cellulitis of lower extremity    Hypokalemia    Ischemic ulcer ([place]) 11/08/2013   Atherosclerosis of native arteries of the extremities with  ulceration(440.23) 11/08/2013   PCP:  [place]:   [facility] ORDER) [place], [address] [place] [address] Phone: [phone]  Fax: [phone]  [facility]  ([place], [place] [address] Phone: [phone] Fax: [phone]  [place], [place] - [address] [place] [address] [place] [address] [place] [address] Phone: [phone] Fax: [phone]   Readmission Risk Interventions Readmission Risk Prevention Plan 10/16/2020  Transportation Screening Complete  Home Care Screening Complete  Medication Review (RN CM) Complete  Some recent data might be hidden

## 2020-10-16 NOTE — Progress Notes (Signed)
PT Cancellation Note  Patient Details Name: Sabrina Wheeler MRN: [id number] DOB: 1945/08/27   Cancelled Treatment:    Reason Eval/Treat Not Completed: Medical issues which prohibited therapy. PT contacted nursing. Nursing reports patient having cardiac and respiratory difficulties this morning and to hold off on PT eval for this am. PT will check back this afternoon as able.   Floria Raveling. Hartnett-Rands, MS, PT Per [place] [phone] [name] 10/16/2020, 9:07 AM

## 2020-10-16 NOTE — Progress Notes (Signed)
Date and time results received: 10/16/20 0920  (use smartphrase ".now" to insert current time)  Test: Troponin Critical Value: 243  Name of Provider Notified: Tat, MD.  Orders Received? Or Actions Taken?:  Awaiting further orders. Pt on Nitro gtt now for chest pain.

## 2020-10-16 NOTE — Progress Notes (Signed)
ANTICOAGULATION CONSULT NOTE -   Pharmacy Consult for heparin Indication: chest pain/ACS  Allergies  Allergen Reactions   Macrobid [Nitrofurantoin Monohyd Macro] Other (See Comments)    Pt states "it paralyzed me"   Sulfamethoxazole-Trimethoprim Other (See Comments)    "paralyzed me"   Doxycycline Nausea And Vomiting   Clindamycin/Lincomycin Rash    Patient Measurements: Height: 5\' 2"  (157.5 cm) Weight: 80.8 kg (178 lb 2.1 oz) IBW/kg (Calculated) : 50.1 Heparin Dosing Weight: 70kg  Vital Signs: Temp: 97.8 F (36.6 C) (08/11 0928) Temp Source: Oral (08/11 0928) BP: 146/47 (08/11 1300) Pulse Rate: 64 (08/11 1330)  Labs: Recent Labs    10/15/20 0018 10/15/20 0243 10/15/20 1213 10/15/20 2208 10/16/20 0344 10/16/20 0844 10/16/20 1242  HGB 8.1*  --   --   --  8.3*  --   --   HCT 25.3*  --   --   --  26.4*  --   --   PLT 275  --   --   --  286  --   --   HEPARINUNFRC  --   --    < > 0.25* 0.45  --  0.72*  CREATININE 2.84*  --   --   --  2.78*  --   --   TROPONINIHS 36* 102*  --   --   --  243*  --    < > = values in this interval not displayed.     Estimated Creatinine Clearance: 17 mL/min (A) (by C-G formula based on SCr of 2.78 mg/dL (H)).   Medical History: Past Medical History:  Diagnosis Date   Broken ankle 2008   left    CHF (congestive heart failure) (HCC)    Diabetes mellitus without complication (HCC)    Hypertension    Obesity    Renal disorder    Stage 4 kidney disease    Assessment: 75yo female c/o intermittent burning CP x1wk, troponin elevated and rising, to begin heparin. Troponin 102 this AM HL 0.72, slightly supratherapeutic  Goal of Therapy:  Heparin level 0.3-0.7 units/ml Monitor platelets by anticoagulation protocol: Yes   Plan:  Decrease heparin infusion 1200 units/hr  Monitor heparin levels  in ~8 hours and daily while on heparin  CBC daily Monitor for S/S of bleeding  Isac Sarna, BS Vena [place], BCPS Clinical  Pharmacist Pager [phone] 10/16/2020,2:32 PM

## 2020-10-16 NOTE — Progress Notes (Signed)
ANTICOAGULATION CONSULT NOTE -   Pharmacy Consult for heparin Indication: chest pain/ACS  Allergies  Allergen Reactions   Macrobid [Nitrofurantoin Monohyd Macro] Other (See Comments)    Pt states "it paralyzed me"   Sulfamethoxazole-Trimethoprim Other (See Comments)    "paralyzed me"   Doxycycline Nausea And Vomiting   Clindamycin/Lincomycin Rash    Patient Measurements: Height: 5\' 2"  (157.5 cm) Weight: 80.8 kg (178 lb 2.1 oz) IBW/kg (Calculated) : 50.1 Heparin Dosing Weight: 70kg  Vital Signs: Temp: 100.6 F (38.1 C) (08/11 0805) Temp Source: Axillary (08/11 0805) BP: 147/53 (08/11 0900) Pulse Rate: 77 (08/11 0906)  Labs: Recent Labs    10/15/20 0018 10/15/20 0243 10/15/20 1213 10/15/20 2208 10/16/20 0344  HGB 8.1*  --   --   --  8.3*  HCT 25.3*  --   --   --  26.4*  PLT 275  --   --   --  286  HEPARINUNFRC  --   --  0.28* 0.25* 0.45  CREATININE 2.84*  --   --   --  2.78*  TROPONINIHS 36* 102*  --   --   --      Estimated Creatinine Clearance: 17 mL/min (A) (by C-G formula based on SCr of 2.78 mg/dL (H)).   Medical History: Past Medical History:  Diagnosis Date   Broken ankle 2008   left    CHF (congestive heart failure) (HCC)    Diabetes mellitus without complication (HCC)    Hypertension    Obesity    Renal disorder    Stage 4 kidney disease    Assessment: 75yo female c/o intermittent burning CP x1wk, troponin elevated and rising, to begin heparin. Troponin 102 this AM HL 0.45, therapeutic  Goal of Therapy:  Heparin level 0.3-0.7 units/ml Monitor platelets by anticoagulation protocol: Yes   Plan:  Continue infusion 1300 units/hr  Monitor heparin levels  in ~8 hours and daily while on heparin  CBC daily Monitor for S/S of bleeding  Isac Sarna, BS [place]ena [place], BCPS Clinical Pharmacist Pager [phone] 10/16/2020,9:07 AM

## 2020-10-16 NOTE — Progress Notes (Signed)
Q Shift dressing change performed on R foot.

## 2020-10-16 NOTE — Progress Notes (Addendum)
Responded to nursing call:  sob Patient having another similar episode to 8/10 late afternoon--developed increasing SOB, chest discomfort   Subjective: She complains of sob, dry cough, chest burning.  N/v/d  Vitals:   10/16/20 0700 10/16/20 0800 10/16/20 0805 10/16/20 0811  BP: (!) 162/63 (!) 169/57    Pulse: (!) 58 80 78 81  Resp: 13 20 (!) 30 (!) 24  Temp:   (!) 100.6 F (38.1 C)   TempSrc:   Axillary   SpO2: 99% (!) 85% (!) 89% 90%  Weight:      Height:       CV--RRR Lung--bilateral rhonchi Abd--soft+BS/NT   Assessment/Plan: Respiratory distress -was down to 2L -now up to 5L -give scheduled lasix now -restart NTG drip -temp 100.6 -blood cultures x 2 -UA/culture -started ceftriaxone and azithro as discussed in earlier note this am -cancel transfer to floor     The patient is critically ill with multiple organ systems failure and requires high complexity decision making for assessment and support, frequent evaluation and titration of therapies, application of advanced monitoring technologies and extensive interpretation of multiple databases.  Critical care time - 35 mins.     Orson Eva, DO [facility]

## 2020-10-16 NOTE — Progress Notes (Signed)
Progress Note  Patient Name: Sabrina Wheeler Date of Encounter: 10/16/2020  [facility] Cardiologist: Carlyle Dolly, MD   Subjective   Not feeling well.  No chest discomfort but breathing has been an issue although currently is slightly better than it was earlier in the morning.  Was able to eat dinner last night but has no appetite at present.  Inpatient Medications    Scheduled Meds:  aspirin  325 mg Oral Daily   atorvastatin  80 mg Oral Daily   azithromycin  500 mg Oral Daily   carvedilol  6.25 mg Oral BID WC   Chlorhexidine Gluconate Cloth  6 each Topical Daily   furosemide  60 mg Intravenous BID   sodium chloride flush  3 mL Intravenous Q12H   Continuous Infusions:  sodium chloride     cefTRIAXone (ROCEPHIN)  IV 1 g (10/16/20 0819)   heparin 1,300 Units/hr (10/16/20 0529)   nitroGLYCERIN 5 mcg/min (10/16/20 0823)   PRN Meds: sodium chloride, acetaminophen, ondansetron (ZOFRAN) IV, sodium chloride flush   Vital Signs    Vitals:   10/16/20 0838 10/16/20 0840 10/16/20 0900 10/16/20 0906  BP:  (!) 164/82 (!) 147/53   Pulse: 88 87 78 77  Resp: (!) 32 (!) 25 (!) 31 (!) 29  Temp:      TempSrc:      SpO2: (!) 88% (!) 89% 93% 95%  Weight:      Height:        Intake/Output Summary (Last 24 hours) at 10/16/2020 0925 Last data filed at 10/16/2020 0529 Gross per 24 hour  Intake 328.72 ml  Output 1750 ml  Net -1421.28 ml   Last 3 Weights 10/16/2020 10/15/2020 10/14/2020  Weight (lbs) 178 lb 2.1 oz 180 lb 1.9 oz 180 lb 12.4 oz  Weight (kg) 80.8 kg 81.7 kg 82 kg      Telemetry     - Personally Reviewed-normal sinus rhythm.  Occasional PVCs with at least 1 pair PVCs noted.  No runs of arrhythmia.  ECG    No change most recent EKG.  First-degree AV block leftward axis with interventricular conduction disturbance  Physical Exam  Patient looks like she is not feeling well and is a bit fatigued. GEN: No acute distress.   Neck: No JVD Cardiac: RRR, no murmurs,  rubs, or gallops.  Respiratory: Bilateral diffuse rhonchi noted.  No rales heard and no wheezing. GI: Soft, nontender, non-distended  MS: Puffiness is improved.  Lower extremities are now wrapped so could not see the wounds. Neuro:  Nonfocal    Labs    High Sensitivity Troponin:   Recent Labs  Lab 10/15/20 0018 10/15/20 0243  TROPONINIHS 36* 102*      Chemistry Recent Labs  Lab 10/15/20 0018 10/16/20 0344  NA 138 138  K 4.8 4.5  CL 111 109  CO2 20* 20*  GLUCOSE 163* 104*  BUN 46* 49*  CREATININE 2.84* 2.78*  CALCIUM 8.0* 8.5*  GFRNONAA 17* 17*  ANIONGAP 7 9     Hematology Recent Labs  Lab 10/15/20 0018 10/15/20 0243 10/16/20 0344  WBC 13.0*  --  11.8*  RBC 2.70* 2.54* 2.82*  HGB 8.1*  --  8.3*  HCT 25.3*  --  26.4*  MCV 93.7  --  93.6  MCH 30.0  --  29.4  MCHC 32.0  --  31.4  RDW 13.2  --  13.0  PLT 275  --  286    BNP Recent Labs  Lab  10/15/20 0018  BNP 527.0*     DDimer No results for input(s): DDIMER in the last 168 hours.   Radiology    DG Chest 2 View  Result Date: 10/15/2020 CLINICAL DATA:  Chest pain and shortness of breath EXAM: CHEST - 2 VIEW COMPARISON:  11/30/2018 FINDINGS: Cardiac shadow is enlarged but stable. Vascular congestion is noted with patchy airspace disease likely representing parenchymal edema. Small posterior effusions are noted. No pneumothorax is noted. No bony abnormality is seen. IMPRESSION: Changes consistent with CHF with patchy parenchymal edema. Electronically Signed   By: Inez Catalina M.D.   On: 10/15/2020 00:50   DG CHEST PORT 1 VIEW  Result Date: 10/15/2020 CLINICAL DATA:  Intermittent chest pain and shortness of breath EXAM: PORTABLE CHEST 1 VIEW COMPARISON:  10/15/2020 12:20 a.m. FINDINGS: Unchanged enlarged cardiac silhouette. Redemonstrated vascular congestion and patchy airspace opacities. Trace pleural effusions. No pneumothorax. No acute osseous abnormality. IMPRESSION: Overall unchanged patchy parenchymal  opacities, concerning for edema. Electronically Signed   By: Merilyn Baba MD   On: 10/15/2020 16:07   ECHOCARDIOGRAM COMPLETE  Result Date: 10/15/2020    ECHOCARDIOGRAM REPORT   Patient Name:   Sabrina Wheeler Date of Exam: 10/15/2020 Medical Rec #:  [id number]        Height:       62.0 in Accession #:    [id number]       Weight:       180.8 lb Date of Birth:  [birth year]        BSA:          1.831 m Patient Age:    17 years         BP:           149/61 mmHg Patient Gender: F                HR:           62 bpm. Exam Location:  [facility] Procedure: 2D Echo, Cardiac Doppler and Color Doppler Indications:    CHF-Acute Diastolic  History:        Patient has prior history of Echocardiogram examinations, most                 recent 01/14/2016. CHF, CAD, Arrythmias:LBBB,                 Signs/Symptoms:Shortness of Breath; Risk Factors:Hypertension                 and Dyslipidemia.  Sonographer:    Wenda Low Referring Phys: [phone] DAVID TAT IMPRESSIONS  1. Compared to echo report from 2017, LVEF is down and wall motion changes are new.  2. There is hypokinesis of the lateral wall, distal anterior and akinesis of the distal inferior, distal inferolateral, distal inferoseptal and apical walls . Left ventricular ejection fraction, by estimation, is 45%. The left ventricular internal cavity size was mildly dilated. There is mild left ventricular hypertrophy. Left ventricular diastolic parameters are consistent with Grade II diastolic dysfunction (pseudonormalization).  3. Right ventricular systolic function is low normal. The right ventricular size is normal. There is moderately elevated pulmonary artery systolic pressure.  4. Left atrial size was moderately dilated.  5. Mild mitral valve regurgitation.  6. The aortic valve is tricuspid. Aortic valve regurgitation is not visualized. Mild to moderate aortic valve sclerosis/calcification is present, without any evidence of aortic stenosis.  7. The inferior vena cava is  dilated in size with >50% respiratory variability, suggesting  right atrial pressure of 8 mmHg. FINDINGS  Left Ventricle: There is hypokinesis of the lateral wall, distal anterior and akinesis of the distal inferior, distal inferolateral, distal inferoseptal and apical walls. Left ventricular ejection fraction, by estimation, is 45%%. The left ventricle has  mildly decreased function. The left ventricular internal cavity size was mildly dilated. There is mild left ventricular hypertrophy. Left ventricular diastolic parameters are consistent with Grade II diastolic dysfunction (pseudonormalization). Right Ventricle: The right ventricular size is normal. Right vetricular wall thickness was not assessed. Right ventricular systolic function is low normal. There is moderately elevated pulmonary artery systolic pressure. The tricuspid regurgitant velocity is 3.06 m/s, and with an assumed right atrial pressure of 8 mmHg, the estimated right ventricular systolic pressure is 63.8 mmHg. Left Atrium: Left atrial size was moderately dilated. Right Atrium: Right atrial size was normal in size. Pericardium: Trivial pericardial effusion is present. Mitral Valve: There is mild thickening of the mitral valve leaflet(s). There is mild calcification of the mitral valve leaflet(s). Mild to moderate mitral annular calcification. Mild mitral valve regurgitation. MV peak gradient, 13.4 mmHg. The mean mitral valve gradient is 5.0 mmHg. Tricuspid Valve: The tricuspid valve is normal in structure. Tricuspid valve regurgitation is mild. Aortic Valve: The aortic valve is tricuspid. Aortic valve regurgitation is not visualized. Mild to moderate aortic valve sclerosis/calcification is present, without any evidence of aortic stenosis. Aortic valve mean gradient measures 7.0 mmHg. Aortic valve peak gradient measures 12.8 mmHg. Aortic valve area, by VTI measures 1.81 cm. Pulmonic Valve: The pulmonic valve was normal in structure. Pulmonic valve  regurgitation is trivial. Aorta: The aortic root and ascending aorta are structurally normal, with no evidence of dilitation. Venous: The inferior vena cava is dilated in size with greater than 50% respiratory variability, suggesting right atrial pressure of 8 mmHg. IAS/Shunts: No atrial level shunt detected by color flow Doppler.  LEFT VENTRICLE PLAX 2D LVIDd:         5.28 cm      Diastology LVIDs:         4.00 cm      LV e' medial:    8.76 cm/s LV PW:         1.26 cm      LV E/e' medial:  18.7 LV IVS:        1.11 cm      LV e' lateral:   10.20 cm/s LVOT diam:     2.00 cm      LV E/e' lateral: 16.1 LV SV:         84 LV SV Index:   46 LVOT Area:     3.14 cm  LV Volumes (MOD) LV vol d, MOD A2C: 136.0 ml LV vol d, MOD A4C: 114.0 ml LV vol s, MOD A2C: 69.1 ml LV vol s, MOD A4C: 67.1 ml LV SV MOD A2C:     66.9 ml LV SV MOD A4C:     114.0 ml LV SV MOD BP:      61.6 ml RIGHT VENTRICLE RV Basal diam:  3.24 cm RV Mid diam:    2.50 cm RV S prime:     15.30 cm/s TAPSE (M-mode): 2.6 cm LEFT ATRIUM             Index       RIGHT ATRIUM           Index LA diam:        4.80 cm 2.62 cm/m  RA Area:  15.90 cm LA Vol (A2C):   93.6 ml 51.11 ml/m RA Volume:   40.90 ml  22.33 ml/m LA Vol (A4C):   88.3 ml 48.22 ml/m LA Biplane Vol: 96.4 ml 52.64 ml/m  AORTIC VALVE AV Area (Vmax):    1.90 cm AV Area (Vmean):   1.78 cm AV Area (VTI):     1.81 cm AV Vmax:           179.00 cm/s AV Vmean:          124.000 cm/s AV VTI:            0.463 m AV Peak Grad:      12.8 mmHg AV Mean Grad:      7.0 mmHg LVOT Vmax:         108.00 cm/s LVOT Vmean:        70.300 cm/s LVOT VTI:          0.267 m LVOT/AV VTI ratio: 0.58  AORTA Ao Root diam: 3.00 cm Ao Asc diam:  3.00 cm MITRAL VALVE                TRICUSPID VALVE MV Area (PHT): 3.91 cm     TR Peak grad:   37.5 mmHg MV Area VTI:   1.94 cm     TR Vmax:        306.00 cm/s MV Peak grad:  13.4 mmHg MV Mean grad:  5.0 mmHg     SHUNTS MV Vmax:       1.83 m/s     Systemic VTI:  0.27 m MV Vmean:       104.0 cm/s   Systemic Diam: 2.00 cm MV Decel Time: 194 msec MV E velocity: 164.00 cm/s MV A velocity: 102.00 cm/s MV E/A ratio:  1.61 Dorris Carnes MD Electronically signed by Dorris Carnes MD Signature Date/Time: 10/15/2020/9:18:51 PM    Final     Cardiac Studies  Echo   1. Compared to echo report from 2017, LVEF is down and wall motion  changes are new.   2. There is hypokinesis of the lateral wall, distal anterior and akinesis  of the distal inferior, distal inferolateral, distal inferoseptal and  apical walls . Left ventricular ejection fraction, by estimation, is 45%.  The left ventricular internal  cavity size was mildly dilated. There is mild left ventricular  hypertrophy. Left ventricular diastolic parameters are consistent with  Grade II diastolic dysfunction (pseudonormalization).   3. Right ventricular systolic function is low normal. The right  ventricular size is normal. There is moderately elevated pulmonary artery  systolic pressure.   4. Left atrial size was moderately dilated.   5. Mild mitral valve regurgitation.   6. The aortic valve is tricuspid. Aortic valve regurgitation is not  visualized. Mild to moderate aortic valve sclerosis/calcification is  present, without any evidence of aortic stenosis.   7. The inferior vena cava is dilated in size with >50% respiratory  variability, suggesting right atrial pressure of 8 mmHg    Assessment & Plan    Congestive heart failure, recent worsening unknown etiology             await the echo to determine whether systolic versus diastolic mechanism 2.  Renal insufficiency, significant, chronic but some recent worsening 3.  Diabetes mellitus type II 4.  Dyslipidemia on therapy 5.  Lower extremity wounds ,right foot area is chronic whereas superficial on left shin is more recent   Recommendation: We will increase the carvedilol from 6.25  to 12.5 mg twice daily.  With her creatinine being elevated agree with holding her ACE/ARB.  We  will add hydralazine to her regimen as a "vasodilator" and the patient is already on nitrates in the form of intravenous nitroglycerin infusion. Her ejection fraction is impaired but not that terrible.  Suspect volume overload may mostly be related to underlying renal insufficiency which has improved minimally since admission. For renal dysfunction do not anticipate ischemia evaluation at this point.  I doubt that we would do invasive/interventional procedures at present as his disease might likely provoke persistent worsening renal dysfunction.  We will be able to get her rid of her fluid and ischemic evaluation can be done electively as an outpatient     For questions or updates, please contact [place] Please consult [email or web address] for contact info under        Signed, Abel Presto, MD  10/16/2020, 9:25 AM

## 2020-10-16 NOTE — Progress Notes (Signed)
PROGRESS NOTE  Sabrina Wheeler EQA:[id number] DOB: Mar 29, 1945 DOA: 10/14/2020 PCP: [facility]  Brief History:  75 y.o. female with medical history of hypertension, diabetes mellitus, hyperlipidemia, CKD presenting with 2 to 3-day history of shortness of breath and chest pain.  The patient is a difficult historian at best.  She states that she has been having substernal chest pain and shortness of breath for the past 2 to 3 days.  She states that her chest pain occurs even at rest.  She states that she has chronic lower extremity edema but thinks that it is about the same as usual.  She has a nonproductive cough.  She denies any fevers, chills, headache, neck pain, nausea, vomiting or diarrhea.  She has not had any hemoptysis, hematochezia, melena.  The patient states that she normally sleeps in a recliner and has been doing so for the last 2 to 3 years.  She states that she has not taken any prescription medication for the past 3 years.  Apparently, the patient's primary care provider, the patient has not reestablished with any provider since then.  Upon EMS arrival, the patient was noted to have oxygen saturation 85% on room air. In the emergency department, the patient was afebrile and hemodynamically stable with oxygen saturation 90% on room air.  She was placed on 2 L with oxygen saturation up to 100%.  BMP shows sodium 138, potassium 4.8, serum creatinine 2.84.  WBC 13.0, hemoglobin 8.1, platelets 275,000.  Troponin 36>>102.  Chest x-ray showed bilateral patchy opacities.  She was started on IV heparin.    Assessment/Plan:  Acute systolic and diastolic CHF -increase IV furosemide 60 mg BID -8/10 PM--developed resp distress requiring NTG drip and additional lasix 80 mg  -remains clinically fluid overloaded -Daily weights -Accurate I's and O's -8/10 Echo--EF 45%,+HK lateral wall; +AK inf and inferolateral; G2DD, PASP 45.5 -d/c nitroglycerin drip   Chest  pain/elevated troponin -Difficult to ascertain whether it is atypical or typical -Troponins elevated secondary to demand ischemia from CHF -Cardiology consult appreciated -defer IV heparin to cardiology   Acute Respiratory failure with hypoxia -85% on RA -required up to 15L HFNC in PM 8/10 -now back to 2L -start empiric ceftriaxone and azithro due to increasing PCT   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 -Baseline creatinine 1.9-2.2 -Presented with serum creatinine 2.84 -Suspect patient may have progression of her underlying CKD -will need to tolerated worsen renal function for euvolemia and improved oxygenation   Essential hypertension -Has not taken any antihypertensive medications for 3 years -Previously took amlodipine, carvedilol, lisinopril -Monitor with diuresis -d/c lisinopril due to CKD -restart coreg -d/c amlodipine due to decrease EF   Hyperlipidemia -LDL 43 -Previously took Lipitor--restart   Diabetes mellitus type 2 -Was not previously taking any agents -Check hemoglobin A1c--5.9   Left pretibial wound -Wound care consult appreciated     Status is: Observation  The patient will require care spanning > 2 midnights and should be moved to inpatient because: IV treatments appropriate due to intensity of illness or inability to take PO  Dispo: The patient is from: Home              Anticipated d/c is to: Home              Patient currently is not medically stable to d/c.   Difficult to place patient No        Family  Communication:   daughter updated 8/11  Consultants:  cardiology  Code Status:  FULL  DVT Prophylaxis:  IV Heparin    Procedures: As Listed in Progress Note Above  Antibiotics: None   Total time spent 35 minutes.  Greater than 50% spent face to face counseling and coordinating care.      Subjective: Patient is breathing 75% better.  Denies f/c, cp, n/v/d, abd pain.  Objective: Vitals:   10/16/20 0352 10/16/20 0400 10/16/20  0500 10/16/20 0524  BP:  (!) 162/55 (!) 161/49   Pulse: 66 64 63   Resp: 18 (!) 22 15   Temp:    97.7 F (36.5 C)  TempSrc:    Oral  SpO2: 96% 99% 97%   Weight:    80.8 kg  Height:        Intake/Output Summary (Last 24 hours) at 10/16/2020 0719 Last data filed at 10/16/2020 0529 Gross per 24 hour  Intake 328.72 ml  Output 1750 ml  Net -1421.28 ml   Weight change: -0.3 kg Exam:  General:  Pt is alert, follows commands appropriately, not in acute distress HEENT: No icterus, No thrush, No neck mass, [place]/AT Cardiovascular: RRR, S1/S2, no rubs, no gallops Respiratory: bibasilar crackles. No wheeze Abdomen: Soft/+BS, non tender, non distended, no guarding Extremities: 1+ LE edema, No lymphangitis, No petechiae, No rashes, no synovitis   Data Reviewed: I have personally reviewed following labs and imaging studies Basic Metabolic Panel: Recent Labs  Lab 10/15/20 0018 10/16/20 0344  NA 138 138  K 4.8 4.5  CL 111 109  CO2 20* 20*  GLUCOSE 163* 104*  BUN 46* 49*  CREATININE 2.84* 2.78*  CALCIUM 8.0* 8.5*   Liver Function Tests: No results for input(s): AST, ALT, ALKPHOS, BILITOT, PROT, ALBUMIN in the last 168 hours. No results for input(s): LIPASE, AMYLASE in the last 168 hours. No results for input(s): AMMONIA in the last 168 hours. Coagulation Profile: No results for input(s): INR, PROTIME in the last 168 hours. CBC: Recent Labs  Lab 10/15/20 0018 10/16/20 0344  WBC 13.0* 11.8*  NEUTROABS 10.8*  --   HGB 8.1* 8.3*  HCT 25.3* 26.4*  MCV 93.7 93.6  PLT 275 286   Cardiac Enzymes: No results for input(s): CKTOTAL, CKMB, CKMBINDEX, TROPONINI in the last 168 hours. BNP: Invalid input(s): POCBNP CBG: No results for input(s): GLUCAP in the last 168 hours. HbA1C: Recent Labs    10/15/20 0018  HGBA1C 5.9*   Urine analysis:    Component Value Date/Time   COLORURINE YELLOW 06/29/2019 0456   APPEARANCEUR CLEAR 06/29/2019 0456   LABSPEC 1.018 06/29/2019 0456    PHURINE 5.0 06/29/2019 0456   GLUCOSEU 50 (A) 06/29/2019 0456   HGBUR NEGATIVE 06/29/2019 0456   BILIRUBINUR NEGATIVE 06/29/2019 0456   KETONESUR NEGATIVE 06/29/2019 0456   PROTEINUR >=300 (A) 06/29/2019 0456   NITRITE NEGATIVE 06/29/2019 0456   LEUKOCYTESUR NEGATIVE 06/29/2019 0456   Sepsis Labs: @LABRCNTIP (procalcitonin:4,lacticidven:4) ) Recent Results (from the past 240 hour(s))  Resp Panel by RT-PCR (Flu A&B, Covid) Nasopharyngeal Swab     Status: None   Collection Time: 10/15/20  3:40 AM   Specimen: Nasopharyngeal Swab; Nasopharyngeal(NP) swabs in vial transport medium  Result Value Ref Range Status   SARS Coronavirus 2 by RT PCR NEGATIVE NEGATIVE Final    Comment: (NOTE) SARS-CoV-2 target nucleic acids are NOT DETECTED.  The SARS-CoV-2 RNA is generally detectable in upper respiratory specimens during the acute phase of infection. The lowest concentration of SARS-CoV-2  viral copies this assay can detect is 138 copies/mL. A negative result does not preclude SARS-Cov-2 infection and should not be used as the sole basis for treatment or other patient management decisions. A negative result may occur with  improper specimen collection/handling, submission of specimen other than nasopharyngeal swab, presence of viral mutation(s) within the areas targeted by this assay, and inadequate number of viral copies(<138 copies/mL). A negative result must be combined with clinical observations, patient history, and epidemiological information. The expected result is Negative.  Fact Sheet for Patients:  EntrepreneurPulse.com.au  Fact Sheet for Healthcare Providers:  IncredibleEmployment.be  This test is no t yet approved or cleared by the [place] FDA and  has been authorized for detection and/or diagnosis of SARS-CoV-2 by FDA under an Emergency Use Authorization (EUA). This EUA will remain  in effect (meaning this test can be used) for the  duration of the COVID-19 declaration under Section 564(b)(1) of the Act, 21 U.S.C.section 360bbb-3(b)(1), unless the authorization is terminated  or revoked sooner.       Influenza A by PCR NEGATIVE NEGATIVE Final   Influenza B by PCR NEGATIVE NEGATIVE Final    Comment: (NOTE) The Xpert Xpress SARS-CoV-2/FLU/RSV plus assay is intended as an aid in the diagnosis of influenza from Nasopharyngeal swab specimens and should not be used as a sole basis for treatment. Nasal washings and aspirates are unacceptable for Xpert Xpress SARS-CoV-2/FLU/RSV testing.  Fact Sheet for Patients: EntrepreneurPulse.com.au  Fact Sheet for Healthcare Providers: IncredibleEmployment.be  This test is not yet approved or cleared by the [place] FDA and has been authorized for detection and/or diagnosis of SARS-CoV-2 by FDA under an Emergency Use Authorization (EUA). This EUA will remain in effect (meaning this test can be used) for the duration of the COVID-19 declaration under Section 564(b)(1) of the Act, 21 U.S.C. section 360bbb-3(b)(1), unless the authorization is terminated or revoked.  Performed at [facility], [address] [place], [place] [address]      Scheduled Meds:  aspirin  325 mg Oral Daily   Chlorhexidine Gluconate Cloth  6 each Topical Daily   furosemide  60 mg Intravenous BID   sodium chloride flush  3 mL Intravenous Q12H   Continuous Infusions:  sodium chloride     heparin 1,300 Units/hr (10/16/20 0529)   nitroGLYCERIN Stopped (10/15/20 1838)    Procedures/Studies: DG Chest 2 View  Result Date: 10/15/2020 CLINICAL DATA:  Chest pain and shortness of breath EXAM: CHEST - 2 VIEW COMPARISON:  11/30/2018 FINDINGS: Cardiac shadow is enlarged but stable. Vascular congestion is noted with patchy airspace disease likely representing parenchymal edema. Small posterior effusions are noted. No pneumothorax is noted. No bony abnormality is seen.  IMPRESSION: Changes consistent with CHF with patchy parenchymal edema. Electronically Signed   By: Inez Catalina M.D.   On: 10/15/2020 00:50   DG CHEST PORT 1 VIEW  Result Date: 10/15/2020 CLINICAL DATA:  Intermittent chest pain and shortness of breath EXAM: PORTABLE CHEST 1 VIEW COMPARISON:  10/15/2020 12:20 a.m. FINDINGS: Unchanged enlarged cardiac silhouette. Redemonstrated vascular congestion and patchy airspace opacities. Trace pleural effusions. No pneumothorax. No acute osseous abnormality. IMPRESSION: Overall unchanged patchy parenchymal opacities, concerning for edema. Electronically Signed   By: Merilyn Baba MD   On: 10/15/2020 16:07   ECHOCARDIOGRAM COMPLETE  Result Date: 10/15/2020    ECHOCARDIOGRAM REPORT   Patient Name:   Sabrina Wheeler Date of Exam: 10/15/2020 Medical Rec #:  [id number]  Height:       62.0 in Accession #:    [id number]       Weight:       180.8 lb Date of Birth:  1945/05/18        BSA:          1.831 m Patient Age:    43 years         BP:           149/61 mmHg Patient Gender: F                HR:           62 bpm. Exam Location:  [facility] Procedure: 2D Echo, Cardiac Doppler and Color Doppler Indications:    CHF-Acute Diastolic  History:        Patient has prior history of Echocardiogram examinations, most                 recent 01/14/2016. CHF, CAD, Arrythmias:LBBB,                 Signs/Symptoms:Shortness of Breath; Risk Factors:Hypertension                 and Dyslipidemia.  Sonographer:    Wenda Low Referring Phys: [id number] Rickard Kennerly IMPRESSIONS  1. Compared to echo report from 2017, LVEF is down and wall motion changes are new.  2. There is hypokinesis of the lateral wall, distal anterior and akinesis of the distal inferior, distal inferolateral, distal inferoseptal and apical walls . Left ventricular ejection fraction, by estimation, is 45%. The left ventricular internal cavity size was mildly dilated. There is mild left ventricular hypertrophy. Left ventricular  diastolic parameters are consistent with Grade II diastolic dysfunction (pseudonormalization).  3. Right ventricular systolic function is low normal. The right ventricular size is normal. There is moderately elevated pulmonary artery systolic pressure.  4. Left atrial size was moderately dilated.  5. Mild mitral valve regurgitation.  6. The aortic valve is tricuspid. Aortic valve regurgitation is not visualized. Mild to moderate aortic valve sclerosis/calcification is present, without any evidence of aortic stenosis.  7. The inferior vena cava is dilated in size with >50% respiratory variability, suggesting right atrial pressure of 8 mmHg. FINDINGS  Left Ventricle: There is hypokinesis of the lateral wall, distal anterior and akinesis of the distal inferior, distal inferolateral, distal inferoseptal and apical walls. Left ventricular ejection fraction, by estimation, is 45%%. The left ventricle has  mildly decreased function. The left ventricular internal cavity size was mildly dilated. There is mild left ventricular hypertrophy. Left ventricular diastolic parameters are consistent with Grade II diastolic dysfunction (pseudonormalization). Right Ventricle: The right ventricular size is normal. Right vetricular wall thickness was not assessed. Right ventricular systolic function is low normal. There is moderately elevated pulmonary artery systolic pressure. The tricuspid regurgitant velocity is 3.06 m/s, and with an assumed right atrial pressure of 8 mmHg, the estimated right ventricular systolic pressure is 88.4 mmHg. Left Atrium: Left atrial size was moderately dilated. Right Atrium: Right atrial size was normal in size. Pericardium: Trivial pericardial effusion is present. Mitral Valve: There is mild thickening of the mitral valve leaflet(s). There is mild calcification of the mitral valve leaflet(s). Mild to moderate mitral annular calcification. Mild mitral valve regurgitation. MV peak gradient, 13.4 mmHg. The  mean mitral valve gradient is 5.0 mmHg. Tricuspid Valve: The tricuspid valve is normal in structure. Tricuspid valve regurgitation is mild. Aortic Valve: The aortic valve is tricuspid. Aortic valve  regurgitation is not visualized. Mild to moderate aortic valve sclerosis/calcification is present, without any evidence of aortic stenosis. Aortic valve mean gradient measures 7.0 mmHg. Aortic valve peak gradient measures 12.8 mmHg. Aortic valve area, by VTI measures 1.81 cm. Pulmonic Valve: The pulmonic valve was normal in structure. Pulmonic valve regurgitation is trivial. Aorta: The aortic root and ascending aorta are structurally normal, with no evidence of dilitation. Venous: The inferior vena cava is dilated in size with greater than 50% respiratory variability, suggesting right atrial pressure of 8 mmHg. IAS/Shunts: No atrial level shunt detected by color flow Doppler.  LEFT VENTRICLE PLAX 2D LVIDd:         5.28 cm      Diastology LVIDs:         4.00 cm      LV e' medial:    8.76 cm/s LV PW:         1.26 cm      LV E/e' medial:  18.7 LV IVS:        1.11 cm      LV e' lateral:   10.20 cm/s LVOT diam:     2.00 cm      LV E/e' lateral: 16.1 LV SV:         84 LV SV Index:   46 LVOT Area:     3.14 cm  LV Volumes (MOD) LV vol d, MOD A2C: 136.0 ml LV vol d, MOD A4C: 114.0 ml LV vol s, MOD A2C: 69.1 ml LV vol s, MOD A4C: 67.1 ml LV SV MOD A2C:     66.9 ml LV SV MOD A4C:     114.0 ml LV SV MOD BP:      61.6 ml RIGHT VENTRICLE RV Basal diam:  3.24 cm RV Mid diam:    2.50 cm RV S prime:     15.30 cm/s TAPSE (M-mode): 2.6 cm LEFT ATRIUM             Index       RIGHT ATRIUM           Index LA diam:        4.80 cm 2.62 cm/m  RA Area:     15.90 cm LA Vol (A2C):   93.6 ml 51.11 ml/m RA Volume:   40.90 ml  22.33 ml/m LA Vol (A4C):   88.3 ml 48.22 ml/m LA Biplane Vol: 96.4 ml 52.64 ml/m  AORTIC VALVE AV Area (Vmax):    1.90 cm AV Area (Vmean):   1.78 cm AV Area (VTI):     1.81 cm AV Vmax:           179.00 cm/s AV Vmean:           124.000 cm/s AV VTI:            0.463 m AV Peak Grad:      12.8 mmHg AV Mean Grad:      7.0 mmHg LVOT Vmax:         108.00 cm/s LVOT Vmean:        70.300 cm/s LVOT VTI:          0.267 m LVOT/AV VTI ratio: 0.58  AORTA Ao Root diam: 3.00 cm Ao Asc diam:  3.00 cm MITRAL VALVE                TRICUSPID VALVE MV Area (PHT): 3.91 cm     TR Peak grad:   37.5 mmHg MV Area VTI:   1.94  cm     TR Vmax:        306.00 cm/s MV Peak grad:  13.4 mmHg MV Mean grad:  5.0 mmHg     SHUNTS MV Vmax:       1.83 m/s     Systemic VTI:  0.27 m MV Vmean:      104.0 cm/s   Systemic Diam: 2.00 cm MV Decel Time: 194 msec MV E velocity: 164.00 cm/s MV A velocity: 102.00 cm/s MV E/A ratio:  1.61 Dorris Carnes MD Electronically signed by Dorris Carnes MD Signature Date/Time: 10/15/2020/9:18:51 PM    Final     Orson Eva, DO  [facility]  If 7PM-7AM, please contact night-coverage [email or web address] Password [facility] 10/16/2020, 7:19 AM   LOS: 0 days

## 2020-10-17 LAB — CBC
HCT: 27.7 % — ABNORMAL LOW (ref 36.0–46.0)
Hemoglobin: 8.8 g/dL — ABNORMAL LOW (ref 12.0–15.0)
MCH: 29.2 pg (ref 26.0–34.0)
MCHC: 31.8 g/dL (ref 30.0–36.0)
MCV: 92 fL (ref 80.0–100.0)
Platelets: 310 10*3/uL (ref 150–400)
RBC: 3.01 MIL/uL — ABNORMAL LOW (ref 3.87–5.11)
RDW: 12.9 % (ref 11.5–15.5)
WBC: 14.9 10*3/uL — ABNORMAL HIGH (ref 4.0–10.5)
nRBC: 0 % (ref 0.0–0.2)

## 2020-10-17 LAB — BASIC METABOLIC PANEL
Anion gap: 11 (ref 5–15)
BUN: 52 mg/dL — ABNORMAL HIGH (ref 8–23)
CO2: 22 mmol/L (ref 22–32)
Calcium: 8.6 mg/dL — ABNORMAL LOW (ref 8.9–10.3)
Chloride: 107 mmol/L (ref 98–111)
Creatinine, Ser: 2.91 mg/dL — ABNORMAL HIGH (ref 0.44–1.00)
GFR, Estimated: 16 mL/min — ABNORMAL LOW (ref >60)
Glucose, Bld: 149 mg/dL — ABNORMAL HIGH (ref 70–99)
Potassium: 4.2 mmol/L (ref 3.5–5.1)
Sodium: 140 mmol/L (ref 135–145)

## 2020-10-17 LAB — HEPARIN LEVEL (UNFRACTIONATED): Heparin Unfractionated: 0.35 IU/mL (ref 0.30–0.70)

## 2020-10-17 LAB — MAGNESIUM: Magnesium: 1.6 mg/dL — ABNORMAL LOW (ref 1.7–2.4)

## 2020-10-17 MED ORDER — TORSEMIDE 20 MG PO TABS: 40.0000 mg | ORAL_TABLET | Freq: Every day | ORAL | Status: DC

## 2020-10-17 MED ORDER — NITROGLYCERIN 0.4 MG/HR TD PT24
0.4000 mg | MEDICATED_PATCH | Freq: Every day | TRANSDERMAL | Status: DC
  Administered 2020-10-17 – 2020-10-24 (×7): 0.4 mg via TRANSDERMAL
  Filled 2020-10-17 (×12): qty 1

## 2020-10-17 NOTE — Progress Notes (Signed)
ANTICOAGULATION CONSULT NOTE -   Pharmacy Consult for heparin Indication: chest pain/ACS  Allergies  Allergen Reactions   Macrobid [Nitrofurantoin Monohyd Macro] Other (See Comments)    Pt states "it paralyzed me"   Sulfamethoxazole-Trimethoprim Other (See Comments)    "paralyzed me"   Doxycycline Nausea And Vomiting   Clindamycin/Lincomycin Rash    Patient Measurements: Height: 5\' 2"  (157.5 cm) Weight: 76.7 kg (169 lb 1.5 oz) IBW/kg (Calculated) : 50.1 Heparin Dosing Weight: 70kg  Vital Signs: BP: 125/43 (08/12 0700) Pulse Rate: 65 (08/12 0700)  Labs: Recent Labs    10/15/20 0018 10/15/20 0243 10/15/20 1213 10/16/20 0344 10/16/20 0844 10/16/20 1242 10/16/20 2213 10/17/20 0404  HGB 8.1*  --   --  8.3*  --   --   --  8.8*  HCT 25.3*  --   --  26.4*  --   --   --  27.7*  PLT 275  --   --  286  --   --   --  310  HEPARINUNFRC  --   --    < > 0.45  --  0.72* 0.42 0.35  CREATININE 2.84*  --   --  2.78*  --   --   --  2.91*  TROPONINIHS 36* 102*  --   --  243*  --   --   --    < > = values in this interval not displayed.     Estimated Creatinine Clearance: 15.8 mL/min (A) (by C-G formula based on SCr of 2.91 mg/dL (H)).   Medical History: Past Medical History:  Diagnosis Date   Broken ankle 2008   left    CHF (congestive heart failure) (HCC)    Diabetes mellitus without complication (HCC)    Hypertension    Obesity    Renal disorder    Stage 4 kidney disease    Assessment: 75yo female c/o intermittent burning CP x1wk, troponin elevated and rising, to begin heparin.    HL 0.35- therapeutic Troponin 102 > 243  Goal of Therapy:  Heparin level 0.3-0.7 units/ml Monitor platelets by anticoagulation protocol: Yes   Plan:  Continue heparin infusion at 1200 units/hr  Monitor heparin levels daily while on heparin CBC daily Monitor for S/S of bleeding  Margot Ables, PharmD Clinical Pharmacist 10/17/2020 7:51 AM

## 2020-10-17 NOTE — Progress Notes (Signed)
Instructed patient on Incentive Spirometer usage.  Patient was able to do just a little over 500 ml.  Left at patient's bedside after patient exhibiting X10 back to me.

## 2020-10-17 NOTE — TOC Progression Note (Signed)
Transition of Care [facility]) - Progression Note    Patient Details  Name: Sabrina Wheeler MRN: [id number] Date of Birth: September 29, 1945  Transition of Care [facility]) CM/SW Contact  Boneta Lucks, RN Phone Number: 10/17/2020, 3:14 PM  Clinical Narrative: PT is recommending HHPT. Patient is agreeable. She has used [facility] in the past.  Vaughan Basta with [facility] accepted the referral for HHPT/RN.  Expected Discharge Plan: [place] Barriers to Discharge: Continued Medical Work up  Expected Discharge Plan and Services Expected Discharge Plan: [place] arrangements for the past 2 months: Single Family Home       HH Arranged: RN, PT [facility] Agency: [place] ([facility]) Date HH Agency Contacted: 10/17/20 Time [place]: [place] Representative spoke with at [place]: Romualdo Bolk   Readmission Risk Interventions Readmission Risk Prevention Plan 10/16/2020  Transportation Screening Complete  Home Care Screening Complete  Medication Review (RN CM) Complete  Some recent data might be hidden

## 2020-10-17 NOTE — Progress Notes (Signed)
Progress Note  Patient Name: Sabrina Wheeler Date of Encounter: 10/17/2020  [facility] Cardiologist: Carlyle Dolly, MD   Subjective   Feeling much better.  No chest discomfort.  Breathing easier.  Appetite is returning and she is eager to eat.  Other than use the commode she has not been out of bed.  Inpatient Medications    Scheduled Meds:  aspirin  325 mg Oral Daily   atorvastatin  80 mg Oral Daily   azithromycin  500 mg Oral Daily   carvedilol  12.5 mg Oral BID WC   Chlorhexidine Gluconate Cloth  6 each Topical Daily   furosemide  60 mg Intravenous BID   hydrALAZINE  25 mg Oral Q8H   sodium chloride flush  3 mL Intravenous Q12H   Continuous Infusions:  sodium chloride     cefTRIAXone (ROCEPHIN)  IV 1 g (10/17/20 0824)   heparin 1,200 Units/hr (10/17/20 0111)   nitroGLYCERIN Stopped (10/16/20 1535)   PRN Meds: sodium chloride, acetaminophen, ondansetron (ZOFRAN) IV, sodium chloride flush   Vital Signs    Vitals:   10/17/20 0800 10/17/20 0802 10/17/20 0826 10/17/20 0900  BP: (!) 118/45   (!) 142/52  Pulse: 73  66 66  Resp: (!) 23  (!) 22 18  Temp:  98.6 F (37 C)    TempSrc:      SpO2: 95%  97% 95%  Weight:      Height:        Intake/Output Summary (Last 24 hours) at 10/17/2020 0908 Last data filed at 10/17/2020 0600 Gross per 24 hour  Intake 609.11 ml  Output 1350 ml  Net -740.89 ml   Last 3 Weights 10/17/2020 10/16/2020 10/15/2020  Weight (lbs) 169 lb 1.5 oz 178 lb 2.1 oz 180 lb 1.9 oz  Weight (kg) 76.7 kg 80.8 kg 81.7 kg      Telemetry    Normal sinus rhythm.  Rare PVC.- Personally Reviewed   Physical Exam  Smiling, eating breakfast when I entered the room. GEN: No acute distress.   Neck: No JVD Cardiac: RRR, no murmurs, rubs, or gallops.  Respiratory: Clear to auscultation bilaterally.  Improved breath sounds without rhonchi noted yesterday. GI: Soft, nontender, non-distended  MS: Lower extremities wrapped.  They feel less  puffy. Neuro:  Nonfocal  Psych: Normal affect   Labs    High Sensitivity Troponin:   Recent Labs  Lab 10/15/20 0018 10/15/20 0243 10/16/20 0844  TROPONINIHS 36* 102* 243*      Chemistry Recent Labs  Lab 10/15/20 0018 10/16/20 0344 10/17/20 0404  NA 138 138 140  K 4.8 4.5 4.2  CL 111 109 107  CO2 20* 20* 22  GLUCOSE 163* 104* 149*  BUN 46* 49* 52*  CREATININE 2.84* 2.78* 2.91*  CALCIUM 8.0* 8.5* 8.6*  GFRNONAA 17* 17* 16*  ANIONGAP 7 9 11      Hematology Recent Labs  Lab 10/15/20 0018 10/15/20 0243 10/16/20 0344 10/17/20 0404  WBC 13.0*  --  11.8* 14.9*  RBC 2.70* 2.54* 2.82* 3.01*  HGB 8.1*  --  8.3* 8.8*  HCT 25.3*  --  26.4* 27.7*  MCV 93.7  --  93.6 92.0  MCH 30.0  --  29.4 29.2  MCHC 32.0  --  31.4 31.8  RDW 13.2  --  13.0 12.9  PLT 275  --  286 310    BNP Recent Labs  Lab 10/15/20 0018  BNP 527.0*     DDimer No results for input(s):  DDIMER in the last 168 hours.   Radiology    DG CHEST PORT 1 VIEW  Result Date: 10/15/2020 CLINICAL DATA:  Intermittent chest pain and shortness of breath EXAM: PORTABLE CHEST 1 VIEW COMPARISON:  10/15/2020 12:20 a.m. FINDINGS: Unchanged enlarged cardiac silhouette. Redemonstrated vascular congestion and patchy airspace opacities. Trace pleural effusions. No pneumothorax. No acute osseous abnormality. IMPRESSION: Overall unchanged patchy parenchymal opacities, concerning for edema. Electronically Signed   By: Merilyn Baba MD   On: 10/15/2020 16:07   ECHOCARDIOGRAM COMPLETE  Result Date: 10/15/2020    ECHOCARDIOGRAM REPORT   Patient Name:   Sabrina Wheeler Date of Exam: 10/15/2020 Medical Rec #:  [id number]        Height:       62.0 in Accession #:    [id number]       Weight:       180.8 lb Date of Birth:  31-Oct-1945        BSA:          1.831 m Patient Age:    75 years         BP:           149/61 mmHg Patient Gender: F                HR:           62 bpm. Exam Location:  [facility] Procedure: 2D Echo, Cardiac  Doppler and Color Doppler Indications:    CHF-Acute Diastolic  History:        Patient has prior history of Echocardiogram examinations, most                 recent 01/14/2016. CHF, CAD, Arrythmias:LBBB,                 Signs/Symptoms:Shortness of Breath; Risk Factors:Hypertension                 and Dyslipidemia.  Sonographer:    Wenda Low Referring Phys: [phone] DAVID TAT IMPRESSIONS  1. Compared to echo report from 2017, LVEF is down and wall motion changes are new.  2. There is hypokinesis of the lateral wall, distal anterior and akinesis of the distal inferior, distal inferolateral, distal inferoseptal and apical walls . Left ventricular ejection fraction, by estimation, is 45%. The left ventricular internal cavity size was mildly dilated. There is mild left ventricular hypertrophy. Left ventricular diastolic parameters are consistent with Grade II diastolic dysfunction (pseudonormalization).  3. Right ventricular systolic function is low normal. The right ventricular size is normal. There is moderately elevated pulmonary artery systolic pressure.  4. Left atrial size was moderately dilated.  5. Mild mitral valve regurgitation.  6. The aortic valve is tricuspid. Aortic valve regurgitation is not visualized. Mild to moderate aortic valve sclerosis/calcification is present, without any evidence of aortic stenosis.  7. The inferior vena cava is dilated in size with >50% respiratory variability, suggesting right atrial pressure of 8 mmHg. FINDINGS  Left Ventricle: There is hypokinesis of the lateral wall, distal anterior and akinesis of the distal inferior, distal inferolateral, distal inferoseptal and apical walls. Left ventricular ejection fraction, by estimation, is 45%%. The left ventricle has  mildly decreased function. The left ventricular internal cavity size was mildly dilated. There is mild left ventricular hypertrophy. Left ventricular diastolic parameters are consistent with Grade II diastolic  dysfunction (pseudonormalization). Right Ventricle: The right ventricular size is normal. Right vetricular wall thickness was not assessed. Right ventricular systolic function is low normal.  There is moderately elevated pulmonary artery systolic pressure. The tricuspid regurgitant velocity is 3.06 m/s, and with an assumed right atrial pressure of 8 mmHg, the estimated right ventricular systolic pressure is 23.3 mmHg. Left Atrium: Left atrial size was moderately dilated. Right Atrium: Right atrial size was normal in size. Pericardium: Trivial pericardial effusion is present. Mitral Valve: There is mild thickening of the mitral valve leaflet(s). There is mild calcification of the mitral valve leaflet(s). Mild to moderate mitral annular calcification. Mild mitral valve regurgitation. MV peak gradient, 13.4 mmHg. The mean mitral valve gradient is 5.0 mmHg. Tricuspid Valve: The tricuspid valve is normal in structure. Tricuspid valve regurgitation is mild. Aortic Valve: The aortic valve is tricuspid. Aortic valve regurgitation is not visualized. Mild to moderate aortic valve sclerosis/calcification is present, without any evidence of aortic stenosis. Aortic valve mean gradient measures 7.0 mmHg. Aortic valve peak gradient measures 12.8 mmHg. Aortic valve area, by VTI measures 1.81 cm. Pulmonic Valve: The pulmonic valve was normal in structure. Pulmonic valve regurgitation is trivial. Aorta: The aortic root and ascending aorta are structurally normal, with no evidence of dilitation. Venous: The inferior vena cava is dilated in size with greater than 50% respiratory variability, suggesting right atrial pressure of 8 mmHg. IAS/Shunts: No atrial level shunt detected by color flow Doppler.  LEFT VENTRICLE PLAX 2D LVIDd:         5.28 cm      Diastology LVIDs:         4.00 cm      LV e' medial:    8.76 cm/s LV PW:         1.26 cm      LV E/e' medial:  18.7 LV IVS:        1.11 cm      LV e' lateral:   10.20 cm/s LVOT diam:      2.00 cm      LV E/e' lateral: 16.1 LV SV:         84 LV SV Index:   46 LVOT Area:     3.14 cm  LV Volumes (MOD) LV vol d, MOD A2C: 136.0 ml LV vol d, MOD A4C: 114.0 ml LV vol s, MOD A2C: 69.1 ml LV vol s, MOD A4C: 67.1 ml LV SV MOD A2C:     66.9 ml LV SV MOD A4C:     114.0 ml LV SV MOD BP:      61.6 ml RIGHT VENTRICLE RV Basal diam:  3.24 cm RV Mid diam:    2.50 cm RV S prime:     15.30 cm/s TAPSE (M-mode): 2.6 cm LEFT ATRIUM             Index       RIGHT ATRIUM           Index LA diam:        4.80 cm 2.62 cm/m  RA Area:     15.90 cm LA Vol (A2C):   93.6 ml 51.11 ml/m RA Volume:   40.90 ml  22.33 ml/m LA Vol (A4C):   88.3 ml 48.22 ml/m LA Biplane Vol: 96.4 ml 52.64 ml/m  AORTIC VALVE AV Area (Vmax):    1.90 cm AV Area (Vmean):   1.78 cm AV Area (VTI):     1.81 cm AV Vmax:           179.00 cm/s AV Vmean:          124.000 cm/s AV VTI:  0.463 m AV Peak Grad:      12.8 mmHg AV Mean Grad:      7.0 mmHg LVOT Vmax:         108.00 cm/s LVOT Vmean:        70.300 cm/s LVOT VTI:          0.267 m LVOT/AV VTI ratio: 0.58  AORTA Ao Root diam: 3.00 cm Ao Asc diam:  3.00 cm MITRAL VALVE                TRICUSPID VALVE MV Area (PHT): 3.91 cm     TR Peak grad:   37.5 mmHg MV Area VTI:   1.94 cm     TR Vmax:        306.00 cm/s MV Peak grad:  13.4 mmHg MV Mean grad:  5.0 mmHg     SHUNTS MV Vmax:       1.83 m/s     Systemic VTI:  0.27 m MV Vmean:      104.0 cm/s   Systemic Diam: 2.00 cm MV Decel Time: 194 msec MV E velocity: 164.00 cm/s MV A velocity: 102.00 cm/s MV E/A ratio:  1.61 Dorris Carnes MD Electronically signed by Dorris Carnes MD Signature Date/Time: 10/15/2020/9:18:51 PM    Final     Cardiac Studies  E 1. Compared to echo report from 2017, LVEF is down and wall motion  changes are new.   2. There is hypokinesis of the lateral wall, distal anterior and akinesis  of the distal inferior, distal inferolateral, distal inferoseptal and  apical walls . Left ventricular ejection fraction, by estimation, is 45%.   The left ventricular internal  cavity size was mildly dilated. There is mild left ventricular  hypertrophy. Left ventricular diastolic parameters are consistent with  Grade II diastolic dysfunction (pseudonormalization).   3. Right ventricular systolic function is low normal. The right  ventricular size is normal. There is moderately elevated pulmonary artery  systolic pressure.   4. Left atrial size was moderately dilated.   5. Mild mitral valve regurgitation.   6. The aortic valve is tricuspid. Aortic valve regurgitation is not  visualized. Mild to moderate aortic valve sclerosis/calcification is  present, without any evidence of aortic stenosis.   7. The inferior vena cava is dilated in size with >50% respiratory  variability, suggesting right atrial pressure of 8 mmHg. cho    Assessment & Plan   Congestive heart failure, recent worsening unknown etiology             echo shows slight reduction in EF to 45% 2.  Renal insufficiency, significant, chronic but some recent worsening 3.  Diabetes mellitus type II 4.  Dyslipidemia on therapy 5.  Lower extremity wounds ,right foot area is chronic whereas superficial on left shin is more recent  Plan: Stop IV nitroglycerin and placed on nitroglycerin skin patch, removing at night. Yesterday we increased her carvedilol and placed her on hydralazine as a "vasodilator".  Would consider decreasing or changing IV furosemide to oral form and transfer to stepdown unit.  For questions or updates, please contact [facility] Please consult [email or web address] for contact info under        Signed, Abel Presto, MD  10/17/2020, 9:08 AM

## 2020-10-17 NOTE — Plan of Care (Signed)

## 2020-10-17 NOTE — Progress Notes (Signed)
Physical Therapy Treatment Patient Details Name: Sabrina Wheeler MRN: [id number] DOB: 10/09/1945 Today's Date: 10/17/2020    History of Present Illness Sabrina Wheeler is a 75 y.o. female with medical history of hypertension, diabetes mellitus, hyperlipidemia, CKD presenting with 2 to 3-day history of shortness of breath and chest pain.  The patient is a difficult historian at best.  She states that she has been having substernal chest pain and shortness of breath for the past 2 to 3 days.  She states that her chest pain occurs even at rest.  She states that she has chronic lower extremity edema but thinks that it is about the same as usual.  She has a nonproductive cough.  She denies any fevers, chills, headache, neck pain, nausea, vomiting or diarrhea.  She has not had any hemoptysis, hematochezia, melena.  The patient states that she normally sleeps in a recliner and has been doing so for the last 2 to 3 years.  She states that she has not taken any prescription medication for the past 3 years.  Apparently, the patient's primary care provider, the patient has not reestablished with any provider since then.  Upon EMS arrival, the patient was noted to have oxygen saturation 85% on room air.  In the emergency department, the patient was afebrile and hemodynamically stable with oxygen saturation 90% on room air.  She was placed on 2 L with oxygen saturation up to 100%.  BMP shows sodium 138, potassium 4.8, serum creatinine 2.84.  WBC 13.0, hemoglobin 8.1, platelets 275,000.  Troponin 36>>102.  Chest x-ray showed bilateral patchy opacities.  She was started on IV heparin.    PT Comments    Patient agreeable to participating in therapy today. Patient much improved today and able to perform bed mobility with cues. Patient performed and ambulation with min guard using RW. Patient ambulated 50 feet on 3 LPM via nasal cannula using RW. Patient did require min assist for directionality of RW as she was having  difficulty understanding verbal cuing.  Patient would continue to benefit from skilled physical therapy in current environment and next venue to continue return to prior function and increase strength, endurance, balance, coordination, and functional mobility and gait skills.    Follow Up Recommendations  Home health PT;Supervision for mobility/OOB     Equipment Recommendations       Recommendations for Other Services       Precautions / Restrictions Precautions Precautions: Fall Restrictions Weight Bearing Restrictions: No    Mobility  Bed Mobility Overal bed mobility: Needs Assistance Bed Mobility: Supine to Sit;Sit to Supine Rolling: Supervision   Supine to sit: Supervision;Min guard;HOB elevated Sit to supine: Supervision;Min guard   General bed mobility comments: increased time and cuing for sequencing of steps; patient able to bridge to move laterally to right in bed    Transfers Overall transfer level: Needs assistance Equipment used: Rolling walker (2 wheeled) Transfers: Sit to/from [facility];Anterior-Posterior Transfer Sit to Stand: Min guard Stand pivot transfers: Min guard;Min assist   Anterior-Posterior transfers: Min guard   General transfer comment: verbal cues for sequencing of steps and directionality  Ambulation/Gait Ambulation/Gait assistance: Min guard Gait Distance (Feet): 50 Feet Assistive device: Rolling walker (2 wheeled) Gait Pattern/deviations: Step-through pattern;Decreased step length - right;Decreased step length - left;Decreased stride length;Wide base of support Gait velocity: decreased   General Gait Details: slow, labored cadence using RW; cues for sequencing of steps and placement of hands; on 3 LPM via nasal cannula; limited  by fatigue   Stairs             Wheelchair Mobility    Modified Rankin (Stroke Patients Only)       Balance Overall balance assessment: Needs assistance Sitting-balance  support: Bilateral upper extremity supported;Feet supported Sitting balance-Leahy Scale: Good Sitting balance - Comments: on edge of ICU bed   Standing balance support: Bilateral upper extremity supported;During functional activity Standing balance-Leahy Scale: Fair Standing balance comment: with RW                            Cognition Arousal/Alertness: Awake/alert Behavior During Therapy: WFL for tasks assessed/performed Overall Cognitive Status: Impaired/Different from baseline                                        Exercises      General Comments        Pertinent Vitals/Pain Pain Assessment: No/denies pain    Home Living                      Prior Function            PT Goals (current goals can now be found in the care plan section) Acute Rehab PT Goals Patient Stated Goal: none stated PT Goal Formulation: With patient Time For Goal Achievement: 10/22/20 Potential to Achieve Goals: Good Progress towards PT goals: Progressing toward goals    Frequency    Min 3X/week      PT Plan Current plan remains appropriate       AM-PAC PT "6 Clicks" Mobility   Outcome Measure  Help needed turning from your back to your side while in a flat bed without using bedrails?: A Little Help needed moving from lying on your back to sitting on the side of a flat bed without using bedrails?: A Little Help needed moving to and from a bed to a chair (including a wheelchair)?: A Little Help needed standing up from a chair using your arms (e.g., wheelchair or bedside chair)?: A Little Help needed to walk in hospital room?: A Little Help needed climbing 3-5 steps with a railing? : A Lot 6 Click Score: 17    End of Session Equipment Utilized During Treatment: Gait belt;Oxygen Activity Tolerance: Patient limited by fatigue Patient left: in bed;with nursing/sitter in room;with call bell/phone within reach Nurse Communication: Mobility  status PT Visit Diagnosis: Muscle weakness (generalized) (M62.81);Other abnormalities of gait and mobility (R26.89)     Time: 9373-4287 PT Time Calculation (min) (ACUTE ONLY): 29 min  Charges:  $Therapeutic Activity: 23-37 mins                     Floria Raveling. Hartnett-Rands, MS, PT Per [place] [phone]  [name] 10/17/2020, 11:07 AM

## 2020-10-18 LAB — CBC
HCT: 25.2 % — ABNORMAL LOW (ref 36.0–46.0)
Hemoglobin: 7.8 g/dL — ABNORMAL LOW (ref 12.0–15.0)
MCH: 29.1 pg (ref 26.0–34.0)
MCHC: 31 g/dL (ref 30.0–36.0)
MCV: 94 fL (ref 80.0–100.0)
Platelets: 249 10*3/uL (ref 150–400)
RBC: 2.68 MIL/uL — ABNORMAL LOW (ref 3.87–5.11)
RDW: 13.2 % (ref 11.5–15.5)
WBC: 10.8 10*3/uL — ABNORMAL HIGH (ref 4.0–10.5)
nRBC: 0 % (ref 0.0–0.2)

## 2020-10-18 LAB — HEPARIN LEVEL (UNFRACTIONATED): Heparin Unfractionated: 0.34 IU/mL (ref 0.30–0.70)

## 2020-10-18 LAB — URINE CULTURE

## 2020-10-18 LAB — BASIC METABOLIC PANEL
Anion gap: 6 (ref 5–15)
BUN: 63 mg/dL — ABNORMAL HIGH (ref 8–23)
CO2: 22 mmol/L (ref 22–32)
Calcium: 8.3 mg/dL — ABNORMAL LOW (ref 8.9–10.3)
Chloride: 110 mmol/L (ref 98–111)
Creatinine, Ser: 3.33 mg/dL — ABNORMAL HIGH (ref 0.44–1.00)
GFR, Estimated: 14 mL/min — ABNORMAL LOW (ref >60)
Glucose, Bld: 116 mg/dL — ABNORMAL HIGH (ref 70–99)
Potassium: 4.2 mmol/L (ref 3.5–5.1)
Sodium: 138 mmol/L (ref 135–145)

## 2020-10-18 NOTE — Progress Notes (Signed)
PROGRESS NOTE  Sabrina Wheeler EQA:[id number] DOB: 1945-07-24 DOA: 10/14/2020 PCP: [facility]  Brief History:  75 y.o. female with medical history of hypertension, diabetes mellitus, hyperlipidemia, CKD presenting with 2 to 3-day history of shortness of breath and chest pain.  The patient is a difficult historian at best.  She states that she has been having substernal chest pain and shortness of breath for the past 2 to 3 days.  She states that her chest pain occurs even at rest.  She states that she has chronic lower extremity edema but thinks that it is about the same as usual.  She has a nonproductive cough.  She denies any fevers, chills, headache, neck pain, nausea, vomiting or diarrhea.  She has not had any hemoptysis, hematochezia, melena.  The patient states that she normally sleeps in a recliner and has been doing so for the last 2 to 3 years.  She states that she has not taken any prescription medication for the past 3 years.  Apparently, the patient's primary care provider, the patient has not reestablished with any provider since then.  Upon EMS arrival, the patient was noted to have oxygen saturation 85% on room air. In the emergency department, the patient was afebrile and hemodynamically stable with oxygen saturation 90% on room air.  She was placed on 2 L with oxygen saturation up to 100%.  BMP shows sodium 138, potassium 4.8, serum creatinine 2.84.  WBC 13.0, hemoglobin 8.1, platelets 275,000.  Troponin 36>>102.  Chest x-ray showed bilateral patchy opacities.  She was started on IV heparin.     Assessment/Plan:   Acute systolic and diastolic CHF -continue IV furosemide 60 mg BID -8/10 PM--developed resp distress requiring NTG drip and additional lasix 80 mg  -8/11 AM--resp distress again requiring NTG drip -remains clinically fluid overloaded -Daily weights -Accurate I's and O's -8/10 Echo--EF 45%,+HK lateral wall; +AK inf and inferolateral;  G2DD, PASP 45.5 -d/c nitroglycerin drip 8/12 -hold diuretic today due to uptrending serum creatinine   Chest pain/elevated troponin -Difficult to ascertain whether it is atypical or typical -Troponins elevated secondary to demand ischemia from CHF -Cardiology consult appreciated -d/c IV heparin   Acute Respiratory failure with hypoxia -85% on RA -required up to 15L HFNC in PM 8/10 -now back to 2L -continue empiric ceftriaxone and azithro due to increasing PCT -wean to RA for saturation >92%   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 -Baseline creatinine 1.9-2.2 -Presented with serum creatinine 2.84 -Suspect patient may have progression of her underlying CKD -will need to tolerated worsen renal function for euvolemia and improved oxygenation   Essential hypertension -Has not taken any antihypertensive medications for 3 years -Previously took amlodipine, carvedilol, lisinopril -Monitor with diuresis -d/c lisinopril due to CKD -restarted coreg -d/c amlodipine due to decrease EF   Hyperlipidemia -LDL 43 -Previously took Lipitor--restart   Diabetes mellitus type 2 -Was not previously taking any agents -Check hemoglobin A1c--5.9   Left pretibial wound -Wound care consult appreciated         Status is: Observation   The patient will require care spanning > 2 midnights and should be moved to inpatient because: IV treatments appropriate due to intensity of illness or inability to take PO   Dispo: The patient is from: Home              Anticipated d/c is to: Home  Patient currently is not medically stable to d/c.              Difficult to place patient No               Family Communication:   daughter updated 8/13   Consultants:  cardiology   Code Status:  FULL   DVT Prophylaxis:  IV Heparin      Procedures: As Listed in Progress Note Above   Antibiotics: None   Subjective: Patient denies fevers, chills, headache, chest pain, dyspnea, nausea,  vomiting, diarrhea, abdominal pain, dysuria, hematuria, hematochezia, and melena.   Objective: Vitals:   10/18/20 0400 10/18/20 0500 10/18/20 0726 10/18/20 1121  BP:  (!) 109/37    Pulse: 65 66  (!) 58  Resp: (!) 22 (!) 22 13 15   Temp: 98.2 F (36.8 C)  98.1 F (36.7 C) 97.9 F (36.6 C)  TempSrc: Oral  Oral Oral  SpO2: 95% 96%  99%  Weight:  78.2 kg    Height:        Intake/Output Summary (Last 24 hours) at 10/18/2020 1533 Last data filed at 10/18/2020 0500 Gross per 24 hour  Intake 333.6 ml  Output 1100 ml  Net -766.4 ml   Weight change: 1.5 kg Exam:  General:  Pt is alert, follows commands appropriately, not in acute distress HEENT: No icterus, No thrush, No neck mass, /AT Cardiovascular: RRR, S1/S2, no rubs, no gallops Respiratory: R-basilar crackles. No wheeze Abdomen: Soft/+BS, non tender, non distended, no guarding Extremities: Nonpitting edema, No lymphangitis, No petechiae, No rashes, no synovitis   Data Reviewed: I have personally reviewed following labs and imaging studies Basic Metabolic Panel: Recent Labs  Lab 10/15/20 0018 10/16/20 0344 10/17/20 0404 10/18/20 0344  NA 138 138 140 138  K 4.8 4.5 4.2 4.2  CL 111 109 107 110  CO2 20* 20* 22 22  GLUCOSE 163* 104* 149* 116*  BUN 46* 49* 52* 63*  CREATININE 2.84* 2.78* 2.91* 3.33*  CALCIUM 8.0* 8.5* 8.6* 8.3*  MG  --   --  1.6*  --    Liver Function Tests: No results for input(s): AST, ALT, ALKPHOS, BILITOT, PROT, ALBUMIN in the last 168 hours. No results for input(s): LIPASE, AMYLASE in the last 168 hours. No results for input(s): AMMONIA in the last 168 hours. Coagulation Profile: No results for input(s): INR, PROTIME in the last 168 hours. CBC: Recent Labs  Lab 10/15/20 0018 10/16/20 0344 10/17/20 0404 10/18/20 0344  WBC 13.0* 11.8* 14.9* 10.8*  NEUTROABS 10.8*  --   --   --   HGB 8.1* 8.3* 8.8* 7.8*  HCT 25.3* 26.4* 27.7* 25.2*  MCV 93.7 93.6 92.0 94.0  PLT 275 286 310 249    Cardiac Enzymes: No results for input(s): CKTOTAL, CKMB, CKMBINDEX, TROPONINI in the last 168 hours. BNP: Invalid input(s): POCBNP CBG: No results for input(s): GLUCAP in the last 168 hours. HbA1C: No results for input(s): HGBA1C in the last 72 hours. Urine analysis:    Component Value Date/Time   COLORURINE STRAW (A) 10/16/2020 1439   APPEARANCEUR HAZY (A) 10/16/2020 1439   LABSPEC 1.006 10/16/2020 1439   PHURINE 5.0 10/16/2020 1439   GLUCOSEU 50 (A) 10/16/2020 1439   HGBUR NEGATIVE 10/16/2020 1439   BILIRUBINUR NEGATIVE 10/16/2020 1439   KETONESUR NEGATIVE 10/16/2020 1439   PROTEINUR 100 (A) 10/16/2020 1439   NITRITE NEGATIVE 10/16/2020 1439   LEUKOCYTESUR NEGATIVE 10/16/2020 1439   Sepsis Labs: @LABRCNTIP (procalcitonin:4,lacticidven:4) ) Recent Results (from  the past 240 hour(s))  Resp Panel by RT-PCR (Flu A&B, Covid) Nasopharyngeal Swab     Status: None   Collection Time: 10/15/20  3:40 AM   Specimen: Nasopharyngeal Swab; Nasopharyngeal(NP) swabs in vial transport medium  Result Value Ref Range Status   SARS Coronavirus 2 by RT PCR NEGATIVE NEGATIVE Final    Comment: (NOTE) SARS-CoV-2 target nucleic acids are NOT DETECTED.  The SARS-CoV-2 RNA is generally detectable in upper respiratory specimens during the acute phase of infection. The lowest concentration of SARS-CoV-2 viral copies this assay can detect is 138 copies/mL. A negative result does not preclude SARS-Cov-2 infection and should not be used as the sole basis for treatment or other patient management decisions. A negative result may occur with  improper specimen collection/handling, submission of specimen other than nasopharyngeal swab, presence of viral mutation(s) within the areas targeted by this assay, and inadequate number of viral copies(<138 copies/mL). A negative result must be combined with clinical observations, patient history, and epidemiological information. The expected result is  Negative.  Fact Sheet for Patients:  EntrepreneurPulse.com.au  Fact Sheet for Healthcare Providers:  IncredibleEmployment.be  This test is no t yet approved or cleared by the [place] FDA and  has been authorized for detection and/or diagnosis of SARS-CoV-2 by FDA under an Emergency Use Authorization (EUA). This EUA will remain  in effect (meaning this test can be used) for the duration of the COVID-19 declaration under Section 564(b)(1) of the Act, 21 U.S.C.section 360bbb-3(b)(1), unless the authorization is terminated  or revoked sooner.       Influenza A by PCR NEGATIVE NEGATIVE Final   Influenza B by PCR NEGATIVE NEGATIVE Final    Comment: (NOTE) The Xpert Xpress SARS-CoV-2/FLU/RSV plus assay is intended as an aid in the diagnosis of influenza from Nasopharyngeal swab specimens and should not be used as a sole basis for treatment. Nasal washings and aspirates are unacceptable for Xpert Xpress SARS-CoV-2/FLU/RSV testing.  Fact Sheet for Patients: EntrepreneurPulse.com.au  Fact Sheet for Healthcare Providers: IncredibleEmployment.be  This test is not yet approved or cleared by the [place] FDA and has been authorized for detection and/or diagnosis of SARS-CoV-2 by FDA under an Emergency Use Authorization (EUA). This EUA will remain in effect (meaning this test can be used) for the duration of the COVID-19 declaration under Section 564(b)(1) of the Act, 21 U.S.C. section 360bbb-3(b)(1), unless the authorization is terminated or revoked.  Performed at [facility], [address], [place], [place] [address]   MRSA Next Gen by PCR, Nasal     Status: None   Collection Time: 10/15/20  6:34 PM   Specimen: Nasal Mucosa; Nasal Swab  Result Value Ref Range Status   MRSA by PCR Next Gen NOT DETECTED NOT DETECTED Final    Comment: (NOTE) The GeneXpert MRSA Assay (FDA approved for NASAL specimens  only), is one component of a comprehensive MRSA colonization surveillance program. It is not intended to diagnose MRSA infection nor to guide or monitor treatment for MRSA infections. Test performance is not FDA approved in patients less than 65 years old. Performed at [facility], [address] [place], [place] [address]   Culture, blood (Routine X 2) w Reflex to ID Panel     Status: None (Preliminary result)   Collection Time: 10/16/20  8:43 AM   Specimen: Right Antecubital; Blood  Result Value Ref Range Status   Specimen Description RIGHT ANTECUBITAL  Final   Special Requests   Final    BOTTLES  DRAWN AEROBIC AND ANAEROBIC Blood Culture adequate volume   Culture   Final    NO GROWTH 2 DAYS Performed at [facility], [address] [place], [place] [address]    Report Status PENDING  Incomplete  Culture, blood (Routine X 2) w Reflex to ID Panel     Status: None (Preliminary result)   Collection Time: 10/16/20  8:44 AM   Specimen: BLOOD RIGHT HAND  Result Value Ref Range Status   Specimen Description BLOOD RIGHT HAND  Final   Special Requests   Final    BOTTLES DRAWN AEROBIC AND ANAEROBIC Blood Culture adequate volume   Culture   Final    NO GROWTH 2 DAYS Performed at [facility], [address], [place], [place] [address]    Report Status PENDING  Incomplete  Urine Culture     Status: Abnormal   Collection Time: 10/16/20  2:39 PM   Specimen: Urine, Clean Catch  Result Value Ref Range Status   Specimen Description   Final    URINE, CLEAN CATCH Performed at [facility], [address], [place], [place] [address]    Special Requests   Final    NONE Performed at [facility], [address], [place], [place] [address]    Culture MULTIPLE SPECIES PRESENT, SUGGEST RECOLLECTION (A)  Final   Report Status 10/18/2020 FINAL  Final     Scheduled Meds:  aspirin  325 mg Oral Daily   atorvastatin  80 mg Oral Daily   azithromycin  500 mg Oral Daily   carvedilol  12.5 mg Oral BID  WC   Chlorhexidine Gluconate Cloth  6 each Topical Daily   hydrALAZINE  25 mg Oral Q8H   nitroGLYCERIN  0.4 mg Transdermal Daily   sodium chloride flush  3 mL Intravenous Q12H   Continuous Infusions:  sodium chloride     cefTRIAXone (ROCEPHIN)  IV 1 g (10/18/20 0752)    Procedures/Studies: DG Chest 2 View  Result Date: 10/15/2020 CLINICAL DATA:  Chest pain and shortness of breath EXAM: CHEST - 2 VIEW COMPARISON:  11/30/2018 FINDINGS: Cardiac shadow is enlarged but stable. Vascular congestion is noted with patchy airspace disease likely representing parenchymal edema. Small posterior effusions are noted. No pneumothorax is noted. No bony abnormality is seen. IMPRESSION: Changes consistent with CHF with patchy parenchymal edema. Electronically Signed   By: Inez Catalina M.D.   On: 10/15/2020 00:50   DG CHEST PORT 1 VIEW  Result Date: 10/15/2020 CLINICAL DATA:  Intermittent chest pain and shortness of breath EXAM: PORTABLE CHEST 1 VIEW COMPARISON:  10/15/2020 12:20 a.m. FINDINGS: Unchanged enlarged cardiac silhouette. Redemonstrated vascular congestion and patchy airspace opacities. Trace pleural effusions. No pneumothorax. No acute osseous abnormality. IMPRESSION: Overall unchanged patchy parenchymal opacities, concerning for edema. Electronically Signed   By: Merilyn Baba MD   On: 10/15/2020 16:07   ECHOCARDIOGRAM COMPLETE  Result Date: 10/15/2020    ECHOCARDIOGRAM REPORT   Patient Name:   Sabrina Wheeler Date of Exam: 10/15/2020 Medical Rec #:  [id number]        Height:       62.0 in Accession #:    [id number]       Weight:       180.8 lb Date of Birth:  08-25-1945        BSA:          1.831 m Patient Age:    51 years         BP:  149/61 mmHg Patient Gender: F                HR:           62 bpm. Exam Location:  [facility] Procedure: 2D Echo, Cardiac Doppler and Color Doppler Indications:    CHF-Acute Diastolic  History:        Patient has prior history of Echocardiogram examinations,  most                 recent 01/14/2016. CHF, CAD, Arrythmias:LBBB,                 Signs/Symptoms:Shortness of Breath; Risk Factors:Hypertension                 and Dyslipidemia.  Sonographer:    Wenda Low Referring Phys: [phone] Sherra Kimmons IMPRESSIONS  1. Compared to echo report from 2017, LVEF is down and wall motion changes are new.  2. There is hypokinesis of the lateral wall, distal anterior and akinesis of the distal inferior, distal inferolateral, distal inferoseptal and apical walls . Left ventricular ejection fraction, by estimation, is 45%. The left ventricular internal cavity size was mildly dilated. There is mild left ventricular hypertrophy. Left ventricular diastolic parameters are consistent with Grade II diastolic dysfunction (pseudonormalization).  3. Right ventricular systolic function is low normal. The right ventricular size is normal. There is moderately elevated pulmonary artery systolic pressure.  4. Left atrial size was moderately dilated.  5. Mild mitral valve regurgitation.  6. The aortic valve is tricuspid. Aortic valve regurgitation is not visualized. Mild to moderate aortic valve sclerosis/calcification is present, without any evidence of aortic stenosis.  7. The inferior vena cava is dilated in size with >50% respiratory variability, suggesting right atrial pressure of 8 mmHg. FINDINGS  Left Ventricle: There is hypokinesis of the lateral wall, distal anterior and akinesis of the distal inferior, distal inferolateral, distal inferoseptal and apical walls. Left ventricular ejection fraction, by estimation, is 45%%. The left ventricle has  mildly decreased function. The left ventricular internal cavity size was mildly dilated. There is mild left ventricular hypertrophy. Left ventricular diastolic parameters are consistent with Grade II diastolic dysfunction (pseudonormalization). Right Ventricle: The right ventricular size is normal. Right vetricular wall thickness was not assessed.  Right ventricular systolic function is low normal. There is moderately elevated pulmonary artery systolic pressure. The tricuspid regurgitant velocity is 3.06 m/s, and with an assumed right atrial pressure of 8 mmHg, the estimated right ventricular systolic pressure is 41.2 mmHg. Left Atrium: Left atrial size was moderately dilated. Right Atrium: Right atrial size was normal in size. Pericardium: Trivial pericardial effusion is present. Mitral Valve: There is mild thickening of the mitral valve leaflet(s). There is mild calcification of the mitral valve leaflet(s). Mild to moderate mitral annular calcification. Mild mitral valve regurgitation. MV peak gradient, 13.4 mmHg. The mean mitral valve gradient is 5.0 mmHg. Tricuspid Valve: The tricuspid valve is normal in structure. Tricuspid valve regurgitation is mild. Aortic Valve: The aortic valve is tricuspid. Aortic valve regurgitation is not visualized. Mild to moderate aortic valve sclerosis/calcification is present, without any evidence of aortic stenosis. Aortic valve mean gradient measures 7.0 mmHg. Aortic valve peak gradient measures 12.8 mmHg. Aortic valve area, by VTI measures 1.81 cm. Pulmonic Valve: The pulmonic valve was normal in structure. Pulmonic valve regurgitation is trivial. Aorta: The aortic root and ascending aorta are structurally normal, with no evidence of dilitation. Venous: The inferior vena cava is dilated in size with greater  than 50% respiratory variability, suggesting right atrial pressure of 8 mmHg. IAS/Shunts: No atrial level shunt detected by color flow Doppler.  LEFT VENTRICLE PLAX 2D LVIDd:         5.28 cm      Diastology LVIDs:         4.00 cm      LV e' medial:    8.76 cm/s LV PW:         1.26 cm      LV E/e' medial:  18.7 LV IVS:        1.11 cm      LV e' lateral:   10.20 cm/s LVOT diam:     2.00 cm      LV E/e' lateral: 16.1 LV SV:         84 LV SV Index:   46 LVOT Area:     3.14 cm  LV Volumes (MOD) LV vol d, MOD A2C: 136.0 ml  LV vol d, MOD A4C: 114.0 ml LV vol s, MOD A2C: 69.1 ml LV vol s, MOD A4C: 67.1 ml LV SV MOD A2C:     66.9 ml LV SV MOD A4C:     114.0 ml LV SV MOD BP:      61.6 ml RIGHT VENTRICLE RV Basal diam:  3.24 cm RV Mid diam:    2.50 cm RV S prime:     15.30 cm/s TAPSE (M-mode): 2.6 cm LEFT ATRIUM             Index       RIGHT ATRIUM           Index LA diam:        4.80 cm 2.62 cm/m  RA Area:     15.90 cm LA Vol (A2C):   93.6 ml 51.11 ml/m RA Volume:   40.90 ml  22.33 ml/m LA Vol (A4C):   88.3 ml 48.22 ml/m LA Biplane Vol: 96.4 ml 52.64 ml/m  AORTIC VALVE AV Area (Vmax):    1.90 cm AV Area (Vmean):   1.78 cm AV Area (VTI):     1.81 cm AV Vmax:           179.00 cm/s AV Vmean:          124.000 cm/s AV VTI:            0.463 m AV Peak Grad:      12.8 mmHg AV Mean Grad:      7.0 mmHg LVOT Vmax:         108.00 cm/s LVOT Vmean:        70.300 cm/s LVOT VTI:          0.267 m LVOT/AV VTI ratio: 0.58  AORTA Ao Root diam: 3.00 cm Ao Asc diam:  3.00 cm MITRAL VALVE                TRICUSPID VALVE MV Area (PHT): 3.91 cm     TR Peak grad:   37.5 mmHg MV Area VTI:   1.94 cm     TR Vmax:        306.00 cm/s MV Peak grad:  13.4 mmHg MV Mean grad:  5.0 mmHg     SHUNTS MV Vmax:       1.83 m/s     Systemic VTI:  0.27 m MV Vmean:      104.0 cm/s   Systemic Diam: 2.00 cm MV Decel Time: 194 msec MV E velocity: 164.00 cm/s MV A velocity:  102.00 cm/s MV E/A ratio:  1.61 Dorris Carnes MD Electronically signed by Dorris Carnes MD Signature Date/Time: 10/15/2020/9:18:51 PM    Final     Orson Eva, DO  [facility]  If 7PM-7AM, please contact night-coverage [email or web address] Password [facility] 10/18/2020, 3:33 PM   LOS: 2 days

## 2020-10-18 NOTE — Progress Notes (Signed)
ANTICOAGULATION CONSULT NOTE -   Pharmacy Consult for heparin Indication: chest pain/ACS  Allergies  Allergen Reactions   Macrobid [Nitrofurantoin Monohyd Macro] Other (See Comments)    Pt states "it paralyzed me"   Sulfamethoxazole-Trimethoprim Other (See Comments)    "paralyzed me"   Doxycycline Nausea And Vomiting   Clindamycin/Lincomycin Rash    Patient Measurements: Height: 5\' 2"  (157.5 cm) Weight: 78.2 kg (172 lb 6.4 oz) IBW/kg (Calculated) : 50.1 Heparin Dosing Weight: 70kg  Vital Signs: Temp: 98.1 F (36.7 C) (08/13 0726) Temp Source: Oral (08/13 0726) BP: 109/37 (08/13 0500) Pulse Rate: 66 (08/13 0500)  Labs: Recent Labs    10/16/20 0344 10/16/20 0844 10/16/20 1242 10/16/20 2213 10/17/20 0404 10/18/20 0344  HGB 8.3*  --   --   --  8.8* 7.8*  HCT 26.4*  --   --   --  27.7* 25.2*  PLT 286  --   --   --  310 249  HEPARINUNFRC 0.45  --    < > 0.42 0.35 0.34  CREATININE 2.78*  --   --   --  2.91* 3.33*  TROPONINIHS  --  243*  --   --   --   --    < > = values in this interval not displayed.     Estimated Creatinine Clearance: 13.9 mL/min (A) (by C-G formula based on SCr of 3.33 mg/dL (H)).   Medical History: Past Medical History:  Diagnosis Date   Broken ankle 2008   left    CHF (congestive heart failure) (HCC)    Diabetes mellitus without complication (HCC)    Hypertension    Obesity    Renal disorder    Stage 4 kidney disease    Assessment: 75yo female c/o intermittent burning CP x1wk, troponin elevated and rising, to begin heparin. HL therapeutic.    HL 0.35- therapeutic Troponin 102 > 243  Goal of Therapy:  Heparin level 0.3-0.7 units/ml Monitor platelets by anticoagulation protocol: Yes   Plan:  Continue heparin infusion at 1200 units/hr  Monitor heparin levels daily while on heparin CBC daily Monitor for S/S of bleeding  Hart Robinsons, PharmD Clinical Pharmacist 10/18/2020 8:07 AM

## 2020-10-18 NOTE — Progress Notes (Signed)
PROGRESS NOTE  PENNIE VANBLARCOM JGG:[id number] DOB: [birth year] DOA: 10/14/2020 PCP: [facility]   Brief History:  75 y.o. female with medical history of hypertension, diabetes mellitus, hyperlipidemia, CKD presenting with 2 to 3-day history of shortness of breath and chest pain.  The patient is a difficult historian at best.  She states that she has been having substernal chest pain and shortness of breath for the past 2 to 3 days.  She states that her chest pain occurs even at rest.  She states that she has chronic lower extremity edema but thinks that it is about the same as usual.  She has a nonproductive cough.  She denies any fevers, chills, headache, neck pain, nausea, vomiting or diarrhea.  She has not had any hemoptysis, hematochezia, melena.  The patient states that she normally sleeps in a recliner and has been doing so for the last 2 to 3 years.  She states that she has not taken any prescription medication for the past 3 years.  Apparently, the patient's primary care provider, the patient has not reestablished with any provider since then.  Upon EMS arrival, the patient was noted to have oxygen saturation 85% on room air. In the emergency department, the patient was afebrile and hemodynamically stable with oxygen saturation 90% on room air.  She was placed on 2 L with oxygen saturation up to 100%.  BMP shows sodium 138, potassium 4.8, serum creatinine 2.84.  WBC 13.0, hemoglobin 8.1, platelets 275,000.  Troponin 36>>102.  Chest x-ray showed bilateral patchy opacities.  She was started on IV heparin.     Assessment/Plan:   Acute systolic and diastolic CHF -continue IV furosemide 60 mg BID -8/10 PM--developed resp distress requiring NTG drip and additional lasix 80 mg  -8/11 AM--resp distress again requiring NTG drip -remains clinically fluid overloaded -Daily weights -Accurate I's and O's -8/10 Echo--EF 45%,+HK lateral wall; +AK inf and  inferolateral; G2DD, PASP 45.5 -d/c nitroglycerin drip toady   Chest pain/elevated troponin -Difficult to ascertain whether it is atypical or typical -Troponins elevated secondary to demand ischemia from CHF -Cardiology consult appreciated -defer IV heparin to cardiology   Acute Respiratory failure with hypoxia -85% on RA -required up to 15L HFNC in PM 8/10 -now back to 2L -start empiric ceftriaxone and azithro due to increasing PCT   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 -Baseline creatinine 1.9-2.2 -Presented with serum creatinine 2.84 -Suspect patient may have progression of her underlying CKD -will need to tolerated worsen renal function for euvolemia and improved oxygenation   Essential hypertension -Has not taken any antihypertensive medications for 3 years -Previously took amlodipine, carvedilol, lisinopril -Monitor with diuresis -d/c lisinopril due to CKD -restarted coreg -d/c amlodipine due to decrease EF   Hyperlipidemia -LDL 43 -Previously took Lipitor--restart   Diabetes mellitus type 2 -Was not previously taking any agents -Check hemoglobin A1c--5.9   Left pretibial wound -Wound care consult appreciated         Status is: Observation   The patient will require care spanning > 2 midnights and should be moved to inpatient because: IV treatments appropriate due to intensity of illness or inability to take PO   Dispo: The patient is from: Home              Anticipated d/c is to: Home              Patient currently is not medically stable  to d/c.              Difficult to place patient No               Family Communication:   daughter updated 8/12   Consultants:  cardiology   Code Status:  FULL   DVT Prophylaxis:  IV Heparin      Procedures: As Listed in Progress Note Above   Antibiotics: None   Subjective: Patient denies fevers, chills, headache, chest pain, dyspnea, nausea, vomiting, diarrhea, abdominal pain, dysuria, hematuria,  hematochezia, and melena.   Objective: Vitals:   10/18/20 0400 10/18/20 0500 10/18/20 0726 10/18/20 1121  BP:  (!) 109/37    Pulse: 65 66  (!) 58  Resp: (!) 22 (!) 22 13 15   Temp: 98.2 F (36.8 C)  98.1 F (36.7 C) 97.9 F (36.6 C)  TempSrc: Oral  Oral Oral  SpO2: 95% 96%  99%  Weight:  78.2 kg    Height:        Intake/Output Summary (Last 24 hours) at 10/18/2020 1528 Last data filed at 10/18/2020 0500 Gross per 24 hour  Intake 333.6 ml  Output 1100 ml  Net -766.4 ml   Weight change: 1.5 kg Exam:  General:  Pt is alert, follows commands appropriately, not in acute distress HEENT: No icterus, No thrush, No neck mass, [place]/AT Cardiovascular: RRR, S1/S2, no rubs, no gallops Respiratory: bibasilar rales. No wheeze Abdomen: Soft/+BS, non tender, non distended, no guarding Extremities: 1 + LE edema, No lymphangitis, No petechiae, No rashes, no synovitis   Data Reviewed: I have personally reviewed following labs and imaging studies Basic Metabolic Panel: Recent Labs  Lab 10/15/20 0018 10/16/20 0344 10/17/20 0404 10/18/20 0344  NA 138 138 140 138  K 4.8 4.5 4.2 4.2  CL 111 109 107 110  CO2 20* 20* 22 22  GLUCOSE 163* 104* 149* 116*  BUN 46* 49* 52* 63*  CREATININE 2.84* 2.78* 2.91* 3.33*  CALCIUM 8.0* 8.5* 8.6* 8.3*  MG  --   --  1.6*  --    Liver Function Tests: No results for input(s): AST, ALT, ALKPHOS, BILITOT, PROT, ALBUMIN in the last 168 hours. No results for input(s): LIPASE, AMYLASE in the last 168 hours. No results for input(s): AMMONIA in the last 168 hours. Coagulation Profile: No results for input(s): INR, PROTIME in the last 168 hours. CBC: Recent Labs  Lab 10/15/20 0018 10/16/20 0344 10/17/20 0404 10/18/20 0344  WBC 13.0* 11.8* 14.9* 10.8*  NEUTROABS 10.8*  --   --   --   HGB 8.1* 8.3* 8.8* 7.8*  HCT 25.3* 26.4* 27.7* 25.2*  MCV 93.7 93.6 92.0 94.0  PLT 275 286 310 249   Cardiac Enzymes: No results for input(s): CKTOTAL, CKMB, CKMBINDEX,  TROPONINI in the last 168 hours. BNP: Invalid input(s): POCBNP CBG: No results for input(s): GLUCAP in the last 168 hours. HbA1C: No results for input(s): HGBA1C in the last 72 hours. Urine analysis:    Component Value Date/Time   COLORURINE STRAW (A) 10/16/2020 1439   APPEARANCEUR HAZY (A) 10/16/2020 1439   LABSPEC 1.006 10/16/2020 1439   PHURINE 5.0 10/16/2020 1439   GLUCOSEU 50 (A) 10/16/2020 1439   HGBUR NEGATIVE 10/16/2020 1439   BILIRUBINUR NEGATIVE 10/16/2020 1439   KETONESUR NEGATIVE 10/16/2020 1439   PROTEINUR 100 (A) 10/16/2020 1439   NITRITE NEGATIVE 10/16/2020 1439   LEUKOCYTESUR NEGATIVE 10/16/2020 1439   Sepsis Labs: @LABRCNTIP (procalcitonin:4,lacticidven:4) ) Recent Results (from the past 240 hour(s))  Resp Panel by RT-PCR (Flu A&B, Covid) Nasopharyngeal Swab     Status: None   Collection Time: 10/15/20  3:40 AM   Specimen: Nasopharyngeal Swab; Nasopharyngeal(NP) swabs in vial transport medium  Result Value Ref Range Status   SARS Coronavirus 2 by RT PCR NEGATIVE NEGATIVE Final    Comment: (NOTE) SARS-CoV-2 target nucleic acids are NOT DETECTED.  The SARS-CoV-2 RNA is generally detectable in upper respiratory specimens during the acute phase of infection. The lowest concentration of SARS-CoV-2 viral copies this assay can detect is 138 copies/mL. A negative result does not preclude SARS-Cov-2 infection and should not be used as the sole basis for treatment or other patient management decisions. A negative result may occur with  improper specimen collection/handling, submission of specimen other than nasopharyngeal swab, presence of viral mutation(s) within the areas targeted by this assay, and inadequate number of viral copies(<138 copies/mL). A negative result must be combined with clinical observations, patient history, and epidemiological information. The expected result is Negative.  Fact Sheet for Patients:   EntrepreneurPulse.com.au  Fact Sheet for Healthcare Providers:  IncredibleEmployment.be  This test is no t yet approved or cleared by the [place] FDA and  has been authorized for detection and/or diagnosis of SARS-CoV-2 by FDA under an Emergency Use Authorization (EUA). This EUA will remain  in effect (meaning this test can be used) for the duration of the COVID-19 declaration under Section 564(b)(1) of the Act, 21 U.S.C.section 360bbb-3(b)(1), unless the authorization is terminated  or revoked sooner.       Influenza A by PCR NEGATIVE NEGATIVE Final   Influenza B by PCR NEGATIVE NEGATIVE Final    Comment: (NOTE) The Xpert Xpress SARS-CoV-2/FLU/RSV plus assay is intended as an aid in the diagnosis of influenza from Nasopharyngeal swab specimens and should not be used as a sole basis for treatment. Nasal washings and aspirates are unacceptable for Xpert Xpress SARS-CoV-2/FLU/RSV testing.  Fact Sheet for Patients: EntrepreneurPulse.com.au  Fact Sheet for Healthcare Providers: IncredibleEmployment.be  This test is not yet approved or cleared by the [place] FDA and has been authorized for detection and/or diagnosis of SARS-CoV-2 by FDA under an Emergency Use Authorization (EUA). This EUA will remain in effect (meaning this test can be used) for the duration of the COVID-19 declaration under Section 564(b)(1) of the Act, 21 U.S.C. section 360bbb-3(b)(1), unless the authorization is terminated or revoked.  Performed at [facility], [address] [place], [place] [address]   MRSA Next Gen by PCR, Nasal     Status: None   Collection Time: 10/15/20  6:34 PM   Specimen: Nasal Mucosa; Nasal Swab  Result Value Ref Range Status   MRSA by PCR Next Gen NOT DETECTED NOT DETECTED Final    Comment: (NOTE) The GeneXpert MRSA Assay (FDA approved for NASAL specimens only), is one component of a  comprehensive MRSA colonization surveillance program. It is not intended to diagnose MRSA infection nor to guide or monitor treatment for MRSA infections. Test performance is not FDA approved in patients less than 47 years old. Performed at [facility], [address], [place], [place] [address]   Culture, blood (Routine X 2) w Reflex to ID Panel     Status: None (Preliminary result)   Collection Time: 10/16/20  8:43 AM   Specimen: Right Antecubital; Blood  Result Value Ref Range Status   Specimen Description RIGHT ANTECUBITAL  Final   Special Requests   Final    BOTTLES DRAWN AEROBIC AND ANAEROBIC Blood  Culture adequate volume   Culture   Final    NO GROWTH 2 DAYS Performed at [facility], [address] [place], [place] [address]    Report Status PENDING  Incomplete  Culture, blood (Routine X 2) w Reflex to ID Panel     Status: None (Preliminary result)   Collection Time: 10/16/20  8:44 AM   Specimen: BLOOD RIGHT HAND  Result Value Ref Range Status   Specimen Description BLOOD RIGHT HAND  Final   Special Requests   Final    BOTTLES DRAWN AEROBIC AND ANAEROBIC Blood Culture adequate volume   Culture   Final    NO GROWTH 2 DAYS Performed at [facility], [address], [place], [place] [address]    Report Status PENDING  Incomplete  Urine Culture     Status: Abnormal   Collection Time: 10/16/20  2:39 PM   Specimen: Urine, Clean Catch  Result Value Ref Range Status   Specimen Description   Final    URINE, CLEAN CATCH Performed at [facility], [address] [place], [place] [address]    Special Requests   Final    NONE Performed at [facility], [address], [place], [place] [address]    Culture MULTIPLE SPECIES PRESENT, SUGGEST RECOLLECTION (A)  Final   Report Status 10/18/2020 FINAL  Final     Scheduled Meds:  aspirin  325 mg Oral Daily   atorvastatin  80 mg Oral Daily   azithromycin  500 mg Oral Daily   carvedilol  12.5 mg Oral BID WC   Chlorhexidine Gluconate  Cloth  6 each Topical Daily   hydrALAZINE  25 mg Oral Q8H   nitroGLYCERIN  0.4 mg Transdermal Daily   sodium chloride flush  3 mL Intravenous Q12H   Continuous Infusions:  sodium chloride     cefTRIAXone (ROCEPHIN)  IV 1 g (10/18/20 0752)    Procedures/Studies: DG Chest 2 View  Result Date: 10/15/2020 CLINICAL DATA:  Chest pain and shortness of breath EXAM: CHEST - 2 VIEW COMPARISON:  11/30/2018 FINDINGS: Cardiac shadow is enlarged but stable. Vascular congestion is noted with patchy airspace disease likely representing parenchymal edema. Small posterior effusions are noted. No pneumothorax is noted. No bony abnormality is seen. IMPRESSION: Changes consistent with CHF with patchy parenchymal edema. Electronically Signed   By: Inez Catalina M.D.   On: 10/15/2020 00:50   DG CHEST PORT 1 VIEW  Result Date: 10/15/2020 CLINICAL DATA:  Intermittent chest pain and shortness of breath EXAM: PORTABLE CHEST 1 VIEW COMPARISON:  10/15/2020 12:20 a.m. FINDINGS: Unchanged enlarged cardiac silhouette. Redemonstrated vascular congestion and patchy airspace opacities. Trace pleural effusions. No pneumothorax. No acute osseous abnormality. IMPRESSION: Overall unchanged patchy parenchymal opacities, concerning for edema. Electronically Signed   By: Merilyn Baba MD   On: 10/15/2020 16:07   ECHOCARDIOGRAM COMPLETE  Result Date: 10/15/2020    ECHOCARDIOGRAM REPORT   Patient Name:   ASJAH RAUDA Date of Exam: 10/15/2020 Medical Rec #:  [id number]        Height:       62.0 in Accession #:    [id number]       Weight:       180.8 lb Date of Birth:  [birth year]        BSA:          1.831 m Patient Age:    14 years         BP:  149/61 mmHg Patient Gender: F                HR:           62 bpm. Exam Location:  [facility] Procedure: 2D Echo, Cardiac Doppler and Color Doppler Indications:    CHF-Acute Diastolic  History:        Patient has prior history of Echocardiogram examinations, most                 recent  01/14/2016. CHF, CAD, Arrythmias:LBBB,                 Signs/Symptoms:Shortness of Breath; Risk Factors:Hypertension                 and Dyslipidemia.  Sonographer:    Wenda Low Referring Phys: [phone] Vika Buske IMPRESSIONS  1. Compared to echo report from 2017, LVEF is down and wall motion changes are new.  2. There is hypokinesis of the lateral wall, distal anterior and akinesis of the distal inferior, distal inferolateral, distal inferoseptal and apical walls . Left ventricular ejection fraction, by estimation, is 45%. The left ventricular internal cavity size was mildly dilated. There is mild left ventricular hypertrophy. Left ventricular diastolic parameters are consistent with Grade II diastolic dysfunction (pseudonormalization).  3. Right ventricular systolic function is low normal. The right ventricular size is normal. There is moderately elevated pulmonary artery systolic pressure.  4. Left atrial size was moderately dilated.  5. Mild mitral valve regurgitation.  6. The aortic valve is tricuspid. Aortic valve regurgitation is not visualized. Mild to moderate aortic valve sclerosis/calcification is present, without any evidence of aortic stenosis.  7. The inferior vena cava is dilated in size with >50% respiratory variability, suggesting right atrial pressure of 8 mmHg. FINDINGS  Left Ventricle: There is hypokinesis of the lateral wall, distal anterior and akinesis of the distal inferior, distal inferolateral, distal inferoseptal and apical walls. Left ventricular ejection fraction, by estimation, is 45%%. The left ventricle has  mildly decreased function. The left ventricular internal cavity size was mildly dilated. There is mild left ventricular hypertrophy. Left ventricular diastolic parameters are consistent with Grade II diastolic dysfunction (pseudonormalization). Right Ventricle: The right ventricular size is normal. Right vetricular wall thickness was not assessed. Right ventricular systolic  function is low normal. There is moderately elevated pulmonary artery systolic pressure. The tricuspid regurgitant velocity is 3.06 m/s, and with an assumed right atrial pressure of 8 mmHg, the estimated right ventricular systolic pressure is 59.5 mmHg. Left Atrium: Left atrial size was moderately dilated. Right Atrium: Right atrial size was normal in size. Pericardium: Trivial pericardial effusion is present. Mitral Valve: There is mild thickening of the mitral valve leaflet(s). There is mild calcification of the mitral valve leaflet(s). Mild to moderate mitral annular calcification. Mild mitral valve regurgitation. MV peak gradient, 13.4 mmHg. The mean mitral valve gradient is 5.0 mmHg. Tricuspid Valve: The tricuspid valve is normal in structure. Tricuspid valve regurgitation is mild. Aortic Valve: The aortic valve is tricuspid. Aortic valve regurgitation is not visualized. Mild to moderate aortic valve sclerosis/calcification is present, without any evidence of aortic stenosis. Aortic valve mean gradient measures 7.0 mmHg. Aortic valve peak gradient measures 12.8 mmHg. Aortic valve area, by VTI measures 1.81 cm. Pulmonic Valve: The pulmonic valve was normal in structure. Pulmonic valve regurgitation is trivial. Aorta: The aortic root and ascending aorta are structurally normal, with no evidence of dilitation. Venous: The inferior vena cava is dilated in size with greater  than 50% respiratory variability, suggesting right atrial pressure of 8 mmHg. IAS/Shunts: No atrial level shunt detected by color flow Doppler.  LEFT VENTRICLE PLAX 2D LVIDd:         5.28 cm      Diastology LVIDs:         4.00 cm      LV e' medial:    8.76 cm/s LV PW:         1.26 cm      LV E/e' medial:  18.7 LV IVS:        1.11 cm      LV e' lateral:   10.20 cm/s LVOT diam:     2.00 cm      LV E/e' lateral: 16.1 LV SV:         84 LV SV Index:   46 LVOT Area:     3.14 cm  LV Volumes (MOD) LV vol d, MOD A2C: 136.0 ml LV vol d, MOD A4C: 114.0 ml  LV vol s, MOD A2C: 69.1 ml LV vol s, MOD A4C: 67.1 ml LV SV MOD A2C:     66.9 ml LV SV MOD A4C:     114.0 ml LV SV MOD BP:      61.6 ml RIGHT VENTRICLE RV Basal diam:  3.24 cm RV Mid diam:    2.50 cm RV S prime:     15.30 cm/s TAPSE (M-mode): 2.6 cm LEFT ATRIUM             Index       RIGHT ATRIUM           Index LA diam:        4.80 cm 2.62 cm/m  RA Area:     15.90 cm LA Vol (A2C):   93.6 ml 51.11 ml/m RA Volume:   40.90 ml  22.33 ml/m LA Vol (A4C):   88.3 ml 48.22 ml/m LA Biplane Vol: 96.4 ml 52.64 ml/m  AORTIC VALVE AV Area (Vmax):    1.90 cm AV Area (Vmean):   1.78 cm AV Area (VTI):     1.81 cm AV Vmax:           179.00 cm/s AV Vmean:          124.000 cm/s AV VTI:            0.463 m AV Peak Grad:      12.8 mmHg AV Mean Grad:      7.0 mmHg LVOT Vmax:         108.00 cm/s LVOT Vmean:        70.300 cm/s LVOT VTI:          0.267 m LVOT/AV VTI ratio: 0.58  AORTA Ao Root diam: 3.00 cm Ao Asc diam:  3.00 cm MITRAL VALVE                TRICUSPID VALVE MV Area (PHT): 3.91 cm     TR Peak grad:   37.5 mmHg MV Area VTI:   1.94 cm     TR Vmax:        306.00 cm/s MV Peak grad:  13.4 mmHg MV Mean grad:  5.0 mmHg     SHUNTS MV Vmax:       1.83 m/s     Systemic VTI:  0.27 m MV Vmean:      104.0 cm/s   Systemic Diam: 2.00 cm MV Decel Time: 194 msec MV E velocity: 164.00 cm/s MV A velocity:  102.00 cm/s MV E/A ratio:  1.61 Dorris Carnes MD Electronically signed by Dorris Carnes MD Signature Date/Time: 10/15/2020/9:18:51 PM    Final     Orson Eva, DO  [facility]  If 7PM-7AM, please contact night-coverage [email or web address] Password [email or web address] 10/18/2020, 3:28 PM   LOS: 2 days

## 2020-10-19 LAB — CBC
HCT: 24.1 % — ABNORMAL LOW (ref 36.0–46.0)
Hemoglobin: 7.4 g/dL — ABNORMAL LOW (ref 12.0–15.0)
MCH: 28.7 pg (ref 26.0–34.0)
MCHC: 30.7 g/dL (ref 30.0–36.0)
MCV: 93.4 fL (ref 80.0–100.0)
Platelets: 259 10*3/uL (ref 150–400)
RBC: 2.58 MIL/uL — ABNORMAL LOW (ref 3.87–5.11)
RDW: 13 % (ref 11.5–15.5)
WBC: 8.3 10*3/uL (ref 4.0–10.5)
nRBC: 0 % (ref 0.0–0.2)

## 2020-10-19 LAB — BASIC METABOLIC PANEL
Anion gap: 6 (ref 5–15)
BUN: 73 mg/dL — ABNORMAL HIGH (ref 8–23)
CO2: 23 mmol/L (ref 22–32)
Calcium: 7.8 mg/dL — ABNORMAL LOW (ref 8.9–10.3)
Chloride: 109 mmol/L (ref 98–111)
Creatinine, Ser: 3.75 mg/dL — ABNORMAL HIGH (ref 0.44–1.00)
GFR, Estimated: 12 mL/min — ABNORMAL LOW (ref >60)
Glucose, Bld: 107 mg/dL — ABNORMAL HIGH (ref 70–99)
Potassium: 4.3 mmol/L (ref 3.5–5.1)
Sodium: 138 mmol/L (ref 135–145)

## 2020-10-19 MED ORDER — ASPIRIN 325 MG PO TABS: 325.0000 mg | ORAL_TABLET | Freq: Every day | ORAL | Status: DC

## 2020-10-19 MED ORDER — SODIUM CHLORIDE 0.9 % IV SOLN
250.0000 mg | Freq: Once | INTRAVENOUS | Status: AC
  Administered 2020-10-19: 250 mg via INTRAVENOUS
  Filled 2020-10-19: qty 20

## 2020-10-19 MED ORDER — CYANOCOBALAMIN 1000 MCG/ML IJ SOLN
1000.0000 ug | Freq: Once | INTRAMUSCULAR | Status: AC
  Administered 2020-10-19: 1000 ug via INTRAMUSCULAR
  Filled 2020-10-19: qty 1

## 2020-10-19 MED ORDER — DONEPEZIL HCL 5 MG PO TABS
5.0000 mg | ORAL_TABLET | Freq: Every day | ORAL | Status: DC
  Administered 2020-10-19 – 2020-10-25 (×6): 5 mg via ORAL
  Filled 2020-10-19 (×6): qty 1

## 2020-10-19 NOTE — Progress Notes (Signed)
PROGRESS NOTE  Sabrina Wheeler TFT:[id number] DOB: 1946/01/31 DOA: 10/14/2020 PCP: [facility]  Brief History:  75 y.o. female with medical history of hypertension, diabetes mellitus, hyperlipidemia, CKD presenting with 2 to 3-day history of shortness of breath and chest pain.  The patient is a difficult historian at best.  She states that she has been having substernal chest pain and shortness of breath for the past 2 to 3 days.  She states that her chest pain occurs even at rest.  She states that she has chronic lower extremity edema but thinks that it is about the same as usual.  She has a nonproductive cough.  She denies any fevers, chills, headache, neck pain, nausea, vomiting or diarrhea.  She has not had any hemoptysis, hematochezia, melena.  The patient states that she normally sleeps in a recliner and has been doing so for the last 2 to 3 years.  She states that she has not taken any prescription medication for the past 3 years.  Apparently, the patient's primary care provider, the patient has not reestablished with any provider since then.  Upon EMS arrival, the patient was noted to have oxygen saturation 85% on room air. In the emergency department, the patient was afebrile and hemodynamically stable with oxygen saturation 90% on room air.  She was placed on 2 L with oxygen saturation up to 100%.  BMP shows sodium 138, potassium 4.8, serum creatinine 2.84.  WBC 13.0, hemoglobin 8.1, platelets 275,000.  Troponin 36>>102.  Chest x-ray showed bilateral patchy opacities.  She was started on IV heparin.     Assessment/Plan:   Acute systolic and diastolic CHF -continue IV furosemide 60 mg BID -8/10 PM--developed resp distress requiring NTG drip and additional lasix 80 mg  -8/11 AM--resp distress again requiring NTG drip -Daily weights -Accurate I's and O's -8/10 Echo--EF 45%,+HK lateral wall; +AK inf and inferolateral; G2DD, PASP 45.5 -d/c nitroglycerin  drip 8/12 -hold diuretic today due to uptrending serum creatinine   Chest pain/elevated troponin -Difficult to ascertain whether it is atypical or typical -Troponins elevated secondary to demand ischemia from CHF -Cardiology consult appreciated -d/c IV heparin--she had >48 hours   Acute Respiratory failure with hypoxia -85% on RA -required up to 15L HFNC in PM 8/10 -now back to 2L -continue empiric ceftriaxone and azithro due to increasing PCT -wean to RA for saturation >92%   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 -Baseline creatinine 1.9-2.2 -Presented with serum creatinine 2.84 -Suspect patient may have progression of her underlying CKD -will need to tolerated worsen renal function for euvolemia and improved oxygenation -continue to hold diuretic due to uptrending serum creatinine   Essential hypertension -Has not taken any antihypertensive medications for 3 years -Previously took amlodipine, carvedilol, lisinopril -Monitor with diuresis -d/c lisinopril due to CKD -restarted coreg -d/c amlodipine due to decrease EF   Hyperlipidemia -LDL 43 -Previously took Lipitor--restart   Diabetes mellitus type 2 -Was not previously taking any agents -Check hemoglobin A1c--5.9   Left pretibial wound -Wound care consult appreciated   Anemia of chronic disease -B12--239>>supplement -iron saturation 7% -ferritin 132 -supplement ferrtin IV -check FOBT     Status is: inpatient   The patient will require care spanning > 2 midnights and should be moved to inpatient because: IV treatments appropriate due to intensity of illness or inability to take PO   Dispo: The patient is from: Home  Anticipated d/c is to: Home              Patient currently is not medically stable to d/c.              Difficult to place patient No               Family Communication:   updated daughter 8/13.  Left VM on 8/14   Consultants:  cardiology   Code Status:  FULL   DVT Prophylaxis:   IV Heparin      Procedures: As Listed in Progress Note Above   Antibiotics: None     Subjective: Patient wants to go home.  Patient denies fevers, chills, headache, chest pain, dyspnea, nausea, vomiting, diarrhea, abdominal pain, dysuria, hematuria, hematochezia, and melena.   Objective: Vitals:   10/19/20 0500 10/19/20 0721 10/19/20 0800 10/19/20 1347  BP: (!) 115/37  (!) 143/39 (!) 135/44  Pulse: (!) 56 (!) 56 (!) 55 62  Resp: 13 14 13    Temp:  98 F (36.7 C)    TempSrc:  Oral    SpO2: 98% 100% 100% 100%  Weight:      Height:        Intake/Output Summary (Last 24 hours) at 10/19/2020 1354 Last data filed at 10/19/2020 1000 Gross per 24 hour  Intake 812.02 ml  Output 1100 ml  Net -287.98 ml   Weight change:  Exam:  General:  Pt is alert, follows commands appropriately, not in acute distress HEENT: No icterus, No thrush, No neck mass, [place]/AT Cardiovascular: RRR, S1/S2, no rubs, no gallops Respiratory: bibasilar rales. No wheeze Abdomen: Soft/+BS, non tender, non distended, no guarding Extremities: 1 + LE edema, No lymphangitis, No petechiae, No rashes, no synovitis   Data Reviewed: I have personally reviewed following labs and imaging studies Basic Metabolic Panel: Recent Labs  Lab 10/15/20 0018 10/16/20 0344 10/17/20 0404 10/18/20 0344 10/19/20 0330  NA 138 138 140 138 138  K 4.8 4.5 4.2 4.2 4.3  CL 111 109 107 110 109  CO2 20* 20* 22 22 23   GLUCOSE 163* 104* 149* 116* 107*  BUN 46* 49* 52* 63* 73*  CREATININE 2.84* 2.78* 2.91* 3.33* 3.75*  CALCIUM 8.0* 8.5* 8.6* 8.3* 7.8*  MG  --   --  1.6*  --   --    Liver Function Tests: No results for input(s): AST, ALT, ALKPHOS, BILITOT, PROT, ALBUMIN in the last 168 hours. No results for input(s): LIPASE, AMYLASE in the last 168 hours. No results for input(s): AMMONIA in the last 168 hours. Coagulation Profile: No results for input(s): INR, PROTIME in the last 168 hours. CBC: Recent Labs  Lab  10/15/20 0018 10/16/20 0344 10/17/20 0404 10/18/20 0344 10/19/20 0330  WBC 13.0* 11.8* 14.9* 10.8* 8.3  NEUTROABS 10.8*  --   --   --   --   HGB 8.1* 8.3* 8.8* 7.8* 7.4*  HCT 25.3* 26.4* 27.7* 25.2* 24.1*  MCV 93.7 93.6 92.0 94.0 93.4  PLT 275 286 310 249 259   Cardiac Enzymes: No results for input(s): CKTOTAL, CKMB, CKMBINDEX, TROPONINI in the last 168 hours. BNP: Invalid input(s): POCBNP CBG: No results for input(s): GLUCAP in the last 168 hours. HbA1C: No results for input(s): HGBA1C in the last 72 hours. Urine analysis:    Component Value Date/Time   COLORURINE STRAW (A) 10/16/2020 1439   APPEARANCEUR HAZY (A) 10/16/2020 1439   LABSPEC 1.006 10/16/2020 1439   PHURINE 5.0 10/16/2020 1439   GLUCOSEU  50 (A) 10/16/2020 1439   HGBUR NEGATIVE 10/16/2020 1439   [place] 10/16/2020 1439   KETONESUR NEGATIVE 10/16/2020 1439   PROTEINUR 100 (A) 10/16/2020 1439   NITRITE NEGATIVE 10/16/2020 1439   LEUKOCYTESUR NEGATIVE 10/16/2020 1439   Sepsis Labs: @LABRCNTIP (procalcitonin:4,lacticidven:4) ) Recent Results (from the past 240 hour(s))  Resp Panel by RT-PCR (Flu A&B, Covid) Nasopharyngeal Swab     Status: None   Collection Time: 10/15/20  3:40 AM   Specimen: Nasopharyngeal Swab; Nasopharyngeal(NP) swabs in vial transport medium  Result Value Ref Range Status   SARS Coronavirus 2 by RT PCR NEGATIVE NEGATIVE Final    Comment: (NOTE) SARS-CoV-2 target nucleic acids are NOT DETECTED.  The SARS-CoV-2 RNA is generally detectable in upper respiratory specimens during the acute phase of infection. The lowest concentration of SARS-CoV-2 viral copies this assay can detect is 138 copies/mL. A negative result does not preclude SARS-Cov-2 infection and should not be used as the sole basis for treatment or other patient management decisions. A negative result may occur with  improper specimen collection/handling, submission of specimen other than nasopharyngeal swab,  presence of viral mutation(s) within the areas targeted by this assay, and inadequate number of viral copies(<138 copies/mL). A negative result must be combined with clinical observations, patient history, and epidemiological information. The expected result is Negative.  Fact Sheet for Patients:  EntrepreneurPulse.com.au  Fact Sheet for Healthcare Providers:  IncredibleEmployment.be  This test is no t yet approved or cleared by the [place] FDA and  has been authorized for detection and/or diagnosis of SARS-CoV-2 by FDA under an Emergency Use Authorization (EUA). This EUA will remain  in effect (meaning this test can be used) for the duration of the COVID-19 declaration under Section 564(b)(1) of the Act, 21 U.S.C.section 360bbb-3(b)(1), unless the authorization is terminated  or revoked sooner.       Influenza A by PCR NEGATIVE NEGATIVE Final   Influenza B by PCR NEGATIVE NEGATIVE Final    Comment: (NOTE) The Xpert Xpress SARS-CoV-2/FLU/RSV plus assay is intended as an aid in the diagnosis of influenza from Nasopharyngeal swab specimens and should not be used as a sole basis for treatment. Nasal washings and aspirates are unacceptable for Xpert Xpress SARS-CoV-2/FLU/RSV testing.  Fact Sheet for Patients: EntrepreneurPulse.com.au  Fact Sheet for Healthcare Providers: IncredibleEmployment.be  This test is not yet approved or cleared by the [place] FDA and has been authorized for detection and/or diagnosis of SARS-CoV-2 by FDA under an Emergency Use Authorization (EUA). This EUA will remain in effect (meaning this test can be used) for the duration of the COVID-19 declaration under Section 564(b)(1) of the Act, 21 U.S.C. section 360bbb-3(b)(1), unless the authorization is terminated or revoked.  Performed at [facility], [address] [place], [place] [address]   MRSA Next Gen by PCR,  Nasal     Status: None   Collection Time: 10/15/20  6:34 PM   Specimen: Nasal Mucosa; Nasal Swab  Result Value Ref Range Status   MRSA by PCR Next Gen NOT DETECTED NOT DETECTED Final    Comment: (NOTE) The GeneXpert MRSA Assay (FDA approved for NASAL specimens only), is one component of a comprehensive MRSA colonization surveillance program. It is not intended to diagnose MRSA infection nor to guide or monitor treatment for MRSA infections. Test performance is not FDA approved in patients less than 39 years old. Performed at [facility], [address], [place], [place] [address]   Culture, blood (Routine X 2) w Reflex  to ID Panel     Status: None (Preliminary result)   Collection Time: 10/16/20  8:43 AM   Specimen: Right Antecubital; Blood  Result Value Ref Range Status   Specimen Description RIGHT ANTECUBITAL  Final   Special Requests   Final    BOTTLES DRAWN AEROBIC AND ANAEROBIC Blood Culture adequate volume   Culture   Final    NO GROWTH 2 DAYS Performed at [facility], [address] [place], [place] [address]    Report Status PENDING  Incomplete  Culture, blood (Routine X 2) w Reflex to ID Panel     Status: None (Preliminary result)   Collection Time: 10/16/20  8:44 AM   Specimen: BLOOD RIGHT HAND  Result Value Ref Range Status   Specimen Description BLOOD RIGHT HAND  Final   Special Requests   Final    BOTTLES DRAWN AEROBIC AND ANAEROBIC Blood Culture adequate volume   Culture   Final    NO GROWTH 2 DAYS Performed at [facility], [address], [place], [place] [address]    Report Status PENDING  Incomplete  Urine Culture     Status: Abnormal   Collection Time: 10/16/20  2:39 PM   Specimen: Urine, Clean Catch  Result Value Ref Range Status   Specimen Description   Final    URINE, CLEAN CATCH Performed at [facility], [address] [place], [place] [address]    Special Requests   Final    NONE Performed at [facility], [address] [place], [place]  [address]    Culture MULTIPLE SPECIES PRESENT, SUGGEST RECOLLECTION (A)  Final   Report Status 10/18/2020 FINAL  Final     Scheduled Meds:  aspirin  325 mg Oral Daily   atorvastatin  80 mg Oral Daily   azithromycin  500 mg Oral Daily   carvedilol  12.5 mg Oral BID WC   Chlorhexidine Gluconate Cloth  6 each Topical Daily   cyanocobalamin  1,000 mcg Intramuscular Once   hydrALAZINE  25 mg Oral Q8H   nitroGLYCERIN  0.4 mg Transdermal Daily   sodium chloride flush  3 mL Intravenous Q12H   Continuous Infusions:  sodium chloride     cefTRIAXone (ROCEPHIN)  IV 1 g (10/19/20 0756)   ferric gluconate (FERRLECIT) IVPB      Procedures/Studies: DG Chest 2 View  Result Date: 10/15/2020 CLINICAL DATA:  Chest pain and shortness of breath EXAM: CHEST - 2 VIEW COMPARISON:  11/30/2018 FINDINGS: Cardiac shadow is enlarged but stable. Vascular congestion is noted with patchy airspace disease likely representing parenchymal edema. Small posterior effusions are noted. No pneumothorax is noted. No bony abnormality is seen. IMPRESSION: Changes consistent with CHF with patchy parenchymal edema. Electronically Signed   By: Inez Catalina M.D.   On: 10/15/2020 00:50   DG CHEST PORT 1 VIEW  Result Date: 10/15/2020 CLINICAL DATA:  Intermittent chest pain and shortness of breath EXAM: PORTABLE CHEST 1 VIEW COMPARISON:  10/15/2020 12:20 a.m. FINDINGS: Unchanged enlarged cardiac silhouette. Redemonstrated vascular congestion and patchy airspace opacities. Trace pleural effusions. No pneumothorax. No acute osseous abnormality. IMPRESSION: Overall unchanged patchy parenchymal opacities, concerning for edema. Electronically Signed   By: Merilyn Baba MD   On: 10/15/2020 16:07   ECHOCARDIOGRAM COMPLETE  Result Date: 10/15/2020    ECHOCARDIOGRAM REPORT   Patient Name:   Sabrina Wheeler Date of Exam: 10/15/2020 Medical Rec #:  [id number]        Height:  62.0 in Accession #:    [id number]       Weight:       180.8 lb Date  of Birth:  12/02/1945        BSA:          1.831 m Patient Age:    49 years         BP:           149/61 mmHg Patient Gender: F                HR:           62 bpm. Exam Location:  [facility] Procedure: 2D Echo, Cardiac Doppler and Color Doppler Indications:    CHF-Acute Diastolic  History:        Patient has prior history of Echocardiogram examinations, most                 recent 01/14/2016. CHF, CAD, Arrythmias:LBBB,                 Signs/Symptoms:Shortness of Breath; Risk Factors:Hypertension                 and Dyslipidemia.  Sonographer:    Wenda Low Referring Phys: [phone] Esperanza Madrazo IMPRESSIONS  1. Compared to echo report from 2017, LVEF is down and wall motion changes are new.  2. There is hypokinesis of the lateral wall, distal anterior and akinesis of the distal inferior, distal inferolateral, distal inferoseptal and apical walls . Left ventricular ejection fraction, by estimation, is 45%. The left ventricular internal cavity size was mildly dilated. There is mild left ventricular hypertrophy. Left ventricular diastolic parameters are consistent with Grade II diastolic dysfunction (pseudonormalization).  3. Right ventricular systolic function is low normal. The right ventricular size is normal. There is moderately elevated pulmonary artery systolic pressure.  4. Left atrial size was moderately dilated.  5. Mild mitral valve regurgitation.  6. The aortic valve is tricuspid. Aortic valve regurgitation is not visualized. Mild to moderate aortic valve sclerosis/calcification is present, without any evidence of aortic stenosis.  7. The inferior vena cava is dilated in size with >50% respiratory variability, suggesting right atrial pressure of 8 mmHg. FINDINGS  Left Ventricle: There is hypokinesis of the lateral wall, distal anterior and akinesis of the distal inferior, distal inferolateral, distal inferoseptal and apical walls. Left ventricular ejection fraction, by estimation, is 45%%. The left ventricle  has  mildly decreased function. The left ventricular internal cavity size was mildly dilated. There is mild left ventricular hypertrophy. Left ventricular diastolic parameters are consistent with Grade II diastolic dysfunction (pseudonormalization). Right Ventricle: The right ventricular size is normal. Right vetricular wall thickness was not assessed. Right ventricular systolic function is low normal. There is moderately elevated pulmonary artery systolic pressure. The tricuspid regurgitant velocity is 3.06 m/s, and with an assumed right atrial pressure of 8 mmHg, the estimated right ventricular systolic pressure is 65.4 mmHg. Left Atrium: Left atrial size was moderately dilated. Right Atrium: Right atrial size was normal in size. Pericardium: Trivial pericardial effusion is present. Mitral Valve: There is mild thickening of the mitral valve leaflet(s). There is mild calcification of the mitral valve leaflet(s). Mild to moderate mitral annular calcification. Mild mitral valve regurgitation. MV peak gradient, 13.4 mmHg. The mean mitral valve gradient is 5.0 mmHg. Tricuspid Valve: The tricuspid valve is normal in structure. Tricuspid valve regurgitation is mild. Aortic Valve: The aortic valve is tricuspid. Aortic valve regurgitation is not visualized. Mild to moderate  aortic valve sclerosis/calcification is present, without any evidence of aortic stenosis. Aortic valve mean gradient measures 7.0 mmHg. Aortic valve peak gradient measures 12.8 mmHg. Aortic valve area, by VTI measures 1.81 cm. Pulmonic Valve: The pulmonic valve was normal in structure. Pulmonic valve regurgitation is trivial. Aorta: The aortic root and ascending aorta are structurally normal, with no evidence of dilitation. Venous: The inferior vena cava is dilated in size with greater than 50% respiratory variability, suggesting right atrial pressure of 8 mmHg. IAS/Shunts: No atrial level shunt detected by color flow Doppler.  LEFT VENTRICLE PLAX 2D  LVIDd:         5.28 cm      Diastology LVIDs:         4.00 cm      LV e' medial:    8.76 cm/s LV PW:         1.26 cm      LV E/e' medial:  18.7 LV IVS:        1.11 cm      LV e' lateral:   10.20 cm/s LVOT diam:     2.00 cm      LV E/e' lateral: 16.1 LV SV:         84 LV SV Index:   46 LVOT Area:     3.14 cm  LV Volumes (MOD) LV vol d, MOD A2C: 136.0 ml LV vol d, MOD A4C: 114.0 ml LV vol s, MOD A2C: 69.1 ml LV vol s, MOD A4C: 67.1 ml LV SV MOD A2C:     66.9 ml LV SV MOD A4C:     114.0 ml LV SV MOD BP:      61.6 ml RIGHT VENTRICLE RV Basal diam:  3.24 cm RV Mid diam:    2.50 cm RV S prime:     15.30 cm/s TAPSE (M-mode): 2.6 cm LEFT ATRIUM             Index       RIGHT ATRIUM           Index LA diam:        4.80 cm 2.62 cm/m  RA Area:     15.90 cm LA Vol (A2C):   93.6 ml 51.11 ml/m RA Volume:   40.90 ml  22.33 ml/m LA Vol (A4C):   88.3 ml 48.22 ml/m LA Biplane Vol: 96.4 ml 52.64 ml/m  AORTIC VALVE AV Area (Vmax):    1.90 cm AV Area (Vmean):   1.78 cm AV Area (VTI):     1.81 cm AV Vmax:           179.00 cm/s AV Vmean:          124.000 cm/s AV VTI:            0.463 m AV Peak Grad:      12.8 mmHg AV Mean Grad:      7.0 mmHg LVOT Vmax:         108.00 cm/s LVOT Vmean:        70.300 cm/s LVOT VTI:          0.267 m LVOT/AV VTI ratio: 0.58  AORTA Ao Root diam: 3.00 cm Ao Asc diam:  3.00 cm MITRAL VALVE                TRICUSPID VALVE MV Area (PHT): 3.91 cm     TR Peak grad:   37.5 mmHg MV Area VTI:   1.94 cm     TR Vmax:  306.00 cm/s MV Peak grad:  13.4 mmHg MV Mean grad:  5.0 mmHg     SHUNTS MV Vmax:       1.83 m/s     Systemic VTI:  0.27 m MV Vmean:      104.0 cm/s   Systemic Diam: 2.00 cm MV Decel Time: 194 msec MV E velocity: 164.00 cm/s MV A velocity: 102.00 cm/s MV E/A ratio:  1.61 Dorris Carnes MD Electronically signed by Dorris Carnes MD Signature Date/Time: 10/15/2020/9:18:51 PM    Final     Orson Eva, DO  [facility]  If 7PM-7AM, please contact night-coverage [email or web address] Password  [email or web address] 10/19/2020, 1:54 PM   LOS: 3 days

## 2020-10-20 DIAGNOSIS — R451 Restlessness and agitation: Secondary | ICD-10-CM

## 2020-10-20 LAB — CBC
HCT: 23.1 % — ABNORMAL LOW (ref 36.0–46.0)
Hemoglobin: 7 g/dL — ABNORMAL LOW (ref 12.0–15.0)
MCH: 29.2 pg (ref 26.0–34.0)
MCHC: 30.3 g/dL (ref 30.0–36.0)
MCV: 96.3 fL (ref 80.0–100.0)
Platelets: 245 10*3/uL (ref 150–400)
RBC: 2.4 MIL/uL — ABNORMAL LOW (ref 3.87–5.11)
RDW: 13.1 % (ref 11.5–15.5)
WBC: 7.3 10*3/uL (ref 4.0–10.5)
nRBC: 0 % (ref 0.0–0.2)

## 2020-10-20 LAB — BASIC METABOLIC PANEL
Anion gap: 10 (ref 5–15)
BUN: 78 mg/dL — ABNORMAL HIGH (ref 8–23)
CO2: 22 mmol/L (ref 22–32)
Calcium: 8.1 mg/dL — ABNORMAL LOW (ref 8.9–10.3)
Chloride: 108 mmol/L (ref 98–111)
Creatinine, Ser: 3.61 mg/dL — ABNORMAL HIGH (ref 0.44–1.00)
GFR, Estimated: 13 mL/min — ABNORMAL LOW (ref >60)
Glucose, Bld: 102 mg/dL — ABNORMAL HIGH (ref 70–99)
Potassium: 4.4 mmol/L (ref 3.5–5.1)
Sodium: 140 mmol/L (ref 135–145)

## 2020-10-20 LAB — ABO/RH: ABO/RH(D): A POS

## 2020-10-20 LAB — PREPARE RBC (CROSSMATCH)

## 2020-10-20 LAB — OCCULT BLOOD X 1 CARD TO LAB, STOOL: Fecal Occult Bld: POSITIVE — AB

## 2020-10-20 MED ORDER — SODIUM CHLORIDE 0.9% IV SOLUTION: Freq: Once | INTRAVENOUS | Status: AC

## 2020-10-20 MED ORDER — HALOPERIDOL LACTATE 5 MG/ML IJ SOLN
5.0000 mg | Freq: Once | INTRAMUSCULAR | Status: AC
  Administered 2020-10-20: 5 mg via INTRAVENOUS
  Filled 2020-10-20: qty 1

## 2020-10-20 MED ORDER — FUROSEMIDE 40 MG PO TABS: 40.0000 mg | ORAL_TABLET | Freq: Every day | ORAL | 1 refills | Status: DC

## 2020-10-20 MED ORDER — HYDRALAZINE HCL 25 MG PO TABS: 25.0000 mg | ORAL_TABLET | Freq: Three times a day (TID) | ORAL | 1 refills | Status: DC

## 2020-10-20 MED ORDER — CYANOCOBALAMIN 500 MCG PO TABS: 500.0000 ug | ORAL_TABLET | Freq: Every day | ORAL | Status: DC

## 2020-10-20 MED ORDER — CARVEDILOL 12.5 MG PO TABS: 12.5000 mg | ORAL_TABLET | Freq: Two times a day (BID) | ORAL | 1 refills | Status: DC

## 2020-10-20 MED ORDER — FUROSEMIDE 40 MG PO TABS
40.0000 mg | ORAL_TABLET | Freq: Every day | ORAL | Status: DC
  Administered 2020-10-22 – 2020-10-25 (×3): 40 mg via ORAL
  Filled 2020-10-20 (×3): qty 1

## 2020-10-20 MED ORDER — VITAMIN B-12 100 MCG PO TABS
500.0000 ug | ORAL_TABLET | Freq: Every day | ORAL | Status: DC
  Administered 2020-10-20 – 2020-10-25 (×5): 500 ug via ORAL
  Filled 2020-10-20 (×5): qty 5

## 2020-10-20 NOTE — Plan of Care (Signed)
Patient on 1L of O2 via nasal cannula, up with standy by assist, generalized weakness noted.  Problem: Education: Goal: Knowledge of General Education information will improve Description: Including pain rating scale, medication(s)/side effects and non-pharmacologic comfort measures Outcome: Progressing   Problem: Health Behavior/Discharge Planning: Goal: Ability to manage health-related needs will improve Outcome: Progressing   Problem: Clinical Measurements: Goal: Ability to maintain clinical measurements within normal limits will improve Outcome: Progressing Goal: Will remain free from infection Outcome: Progressing Goal: Diagnostic test results will improve Outcome: Progressing Goal: Respiratory complications will improve Outcome: Progressing Goal: Cardiovascular complication will be avoided Outcome: Progressing   Problem: Activity: Goal: Risk for activity intolerance will decrease Outcome: Progressing   Problem: Nutrition: Goal: Adequate nutrition will be maintained Outcome: Progressing   Problem: Coping: Goal: Level of anxiety will decrease Outcome: Progressing   Problem: Elimination: Goal: Will not experience complications related to bowel motility Outcome: Progressing Goal: Will not experience complications related to urinary retention Outcome: Progressing   Problem: Pain Managment: Goal: General experience of comfort will improve Outcome: Progressing   Problem: Safety: Goal: Ability to remain free from injury will improve Outcome: Progressing   Problem: Skin Integrity: Goal: Risk for impaired skin integrity will decrease Outcome: Progressing

## 2020-10-20 NOTE — Discharge Summary (Signed)
Physician Discharge Summary  Sabrina Wheeler FGH:[id number] DOB: 1945/11/15 DOA: 10/14/2020  PCP: [facility] date: 10/14/2020 Discharge date: 10/20/2020  Admitted From: Home Disposition:  Home   Recommendations for Outpatient Follow-up:  Follow up with PCP in 1-2 weeks Please obtain BMP/CBC in one week   [facility]  HHPT, RN   Discharge Condition: Stable CODE STATUS: FULL Diet recommendation: Heart Healthy   Brief/Interim Summary: 75 y.o. female with medical history of hypertension, diabetes mellitus, hyperlipidemia, CKD presenting with 2 to 3-day history of shortness of breath and chest pain.  The patient is a difficult historian at best.  She states that she has been having substernal chest pain and shortness of breath for the past 2 to 3 days.  She states that her chest pain occurs even at rest.  She states that she has chronic lower extremity edema but thinks that it is about the same as usual.  She has a nonproductive cough.  She denies any fevers, chills, headache, neck pain, nausea, vomiting or diarrhea.  She has not had any hemoptysis, hematochezia, melena.  The patient states that she normally sleeps in a recliner and has been doing so for the last 2 to 3 years.  She states that she has not taken any prescription medication for the past 3 years.  Apparently, the patient's primary care provider, the patient has not reestablished with any provider since then.  Upon EMS arrival, the patient was noted to have oxygen saturation 85% on room air. In the emergency department, the patient was afebrile and hemodynamically stable with oxygen saturation 90% on room air.  She was placed on 2 L with oxygen saturation up to 100%.  BMP shows sodium 138, potassium 4.8, serum creatinine 2.84.  WBC 13.0, hemoglobin 8.1, platelets 275,000.  Troponin 36>>102.  Chest x-ray showed bilateral patchy opacities.  She was started on IV heparin.  Discharge Diagnoses:    Acute systolic and diastolic CHF -continue IV furosemide 60 mg BID -8/10 PM--developed resp distress requiring NTG drip and additional lasix 80 mg  -8/11 AM--resp distress again requiring NTG drip -Daily weights -Accurate I's and O's -8/10 Echo--EF 45%,+HK lateral wall; +AK inf and inferolateral; G2DD, PASP 45.5 -d/c nitroglycerin drip 8/12 -hold diuretic 8/13 and 8/14 due to uptrending serum creatinine>>serum creatinine beginning to improve -restart lasix 40 mg po daily on 10/22/20 -will need repeat BMP within one week on discharge--discussed with patient and daughter   Chest pain/elevated troponin -Difficult to ascertain whether it is atypical or typical -Troponins elevated secondary to demand ischemia from CHF -Cardiology consult appreciated -d/c IV heparin--she had >48 hours during this hospitalization -continue Aspirin   Acute Respiratory failure with hypoxia -85% on RA -required up to 15L HFNC in PM 8/10 -now back to 2L -continue empiric ceftriaxone and azithro due to increasing PCT -wean to RA for saturation >92% -finished 5 days ceftriaxone and azithro during hospitalizatoin   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 -Baseline creatinine 1.9-2.2 -Presented with serum creatinine 2.84 -Suspect patient may have progression of her underlying CKD -will need to tolerated worsen renal function for euvolemia and improved oxygenation -continue to hold diuretic due to uptrending serum creatinine -serum creatinine pleateaued at 3.61 -will need recheck BMP within one week   Essential hypertension -Has not taken any antihypertensive medications for 3 years -Previously took amlodipine, carvedilol, lisinopril -Monitor with diuresis -d/c lisinopril due to CKD -restarted coreg -d/c amlodipine due to decrease EF   Hyperlipidemia -LDL  36 -Previously took Lipitor--restart   Diabetes mellitus type 2 -Was not previously taking any agents -Check hemoglobin A1c--5.9   Left pretibial  wound -Wound care consult appreciated   Anemia of chronic disease -B12--239>>supplement -iron saturation 7% -ferritin 132 -supplement ferrtin IV and B12 IM x 1 -check FOBT -d/c home with ferrous sulfate and B12 supplement -pt states she wanted to go home 8/15 and does not want any procedures done at this time -repeat CBC in one week  Discharge Instructions   Allergies as of 10/20/2020       Reactions   Macrobid [nitrofurantoin Monohyd Macro] Other (See Comments)   Pt states "it paralyzed me"   Sulfamethoxazole-trimethoprim Other (See Comments)   "paralyzed me"   Doxycycline Nausea And Vomiting   Clindamycin/lincomycin Rash        Medication List     STOP taking these medications    amLODipine 10 MG tablet Commonly known as: NORVASC   amoxicillin 500 MG capsule Commonly known as: AMOXIL   cephALEXin 250 MG capsule Commonly known as: KEFLEX   lisinopril 5 MG tablet Commonly known as: ZESTRIL   minocycline 100 MG capsule Commonly known as: Minocin   ondansetron 4 MG tablet Commonly known as: ZOFRAN       TAKE these medications    aspirin 325 MG tablet Take 1 tablet (325 mg total) by mouth daily.   atorvastatin 80 MG tablet Commonly known as: LIPITOR Take 1 tablet by mouth daily.   Calcium Carb-Cholecalciferol 600-800 MG-UNIT Tabs Take 1 tablet by mouth at bedtime.   carvedilol 12.5 MG tablet Commonly known as: COREG Take 1 tablet (12.5 mg total) by mouth 2 (two) times daily with a meal. What changed:  medication strength how much to take   CINNAMON PO Take 1 tablet by mouth daily.   donepezil 5 MG tablet Commonly known as: ARICEPT Take 5 mg by mouth daily.   escitalopram 10 MG tablet Commonly known as: LEXAPRO Take 10 mg by mouth daily.   furosemide 40 MG tablet Commonly known as: LASIX Take 1 tablet (40 mg total) by mouth daily. Start taking on: October 22, 2020   hydrALAZINE 25 MG tablet Commonly known as: APRESOLINE Take 1  tablet (25 mg total) by mouth every 8 (eight) hours.   multivitamin with minerals tablet Take 1 tablet by mouth daily.   sodium bicarbonate 650 MG tablet Take 650 mg by mouth 2 (two) times daily.   triamcinolone ointment 0.1 % Commonly known as: KENALOG Apply 1 application topically 2 (two) times daily.   TURMERIC PO Take 1 tablet by mouth daily.   vitamin B-12 500 MCG tablet Commonly known as: CYANOCOBALAMIN Take 1 tablet (500 mcg total) by mouth daily.   Vitamin C CR 1000 MG Tbcr Take 500 mg by mouth daily.        Allergies  Allergen Reactions   Macrobid [Nitrofurantoin Monohyd Macro] Other (See Comments)    Pt states "it paralyzed me"   Sulfamethoxazole-Trimethoprim Other (See Comments)    "paralyzed me"   Doxycycline Nausea And Vomiting   Clindamycin/Lincomycin Rash    Consultations: cardiology   Procedures/Studies: DG Chest 2 View  Result Date: 10/15/2020 CLINICAL DATA:  Chest pain and shortness of breath EXAM: CHEST - 2 VIEW COMPARISON:  11/30/2018 FINDINGS: Cardiac shadow is enlarged but stable. Vascular congestion is noted with patchy airspace disease likely representing parenchymal edema. Small posterior effusions are noted. No pneumothorax is noted. No bony abnormality is seen. IMPRESSION: Changes consistent with  CHF with patchy parenchymal edema. Electronically Signed   By: Inez Catalina M.D.   On: 10/15/2020 00:50   DG CHEST PORT 1 VIEW  Result Date: 10/15/2020 CLINICAL DATA:  Intermittent chest pain and shortness of breath EXAM: PORTABLE CHEST 1 VIEW COMPARISON:  10/15/2020 12:20 a.m. FINDINGS: Unchanged enlarged cardiac silhouette. Redemonstrated vascular congestion and patchy airspace opacities. Trace pleural effusions. No pneumothorax. No acute osseous abnormality. IMPRESSION: Overall unchanged patchy parenchymal opacities, concerning for edema. Electronically Signed   By: Merilyn Baba MD   On: 10/15/2020 16:07   ECHOCARDIOGRAM COMPLETE  Result Date:  10/15/2020    ECHOCARDIOGRAM REPORT   Patient Name:   CHANETTA MOOSMAN Date of Exam: 10/15/2020 Medical Rec #:  [id number]        Height:       62.0 in Accession #:    [id number]       Weight:       180.8 lb Date of Birth:  Aug 21, 1945        BSA:          1.831 m Patient Age:    34 years         BP:           149/61 mmHg Patient Gender: F                HR:           62 bpm. Exam Location:  [facility] Procedure: 2D Echo, Cardiac Doppler and Color Doppler Indications:    CHF-Acute Diastolic  History:        Patient has prior history of Echocardiogram examinations, most                 recent 01/14/2016. CHF, CAD, Arrythmias:LBBB,                 Signs/Symptoms:Shortness of Breath; Risk Factors:Hypertension                 and Dyslipidemia.  Sonographer:    Wenda Low Referring Phys: [phone] Sadie Hazelett IMPRESSIONS  1. Compared to echo report from 2017, LVEF is down and wall motion changes are new.  2. There is hypokinesis of the lateral wall, distal anterior and akinesis of the distal inferior, distal inferolateral, distal inferoseptal and apical walls . Left ventricular ejection fraction, by estimation, is 45%. The left ventricular internal cavity size was mildly dilated. There is mild left ventricular hypertrophy. Left ventricular diastolic parameters are consistent with Grade II diastolic dysfunction (pseudonormalization).  3. Right ventricular systolic function is low normal. The right ventricular size is normal. There is moderately elevated pulmonary artery systolic pressure.  4. Left atrial size was moderately dilated.  5. Mild mitral valve regurgitation.  6. The aortic valve is tricuspid. Aortic valve regurgitation is not visualized. Mild to moderate aortic valve sclerosis/calcification is present, without any evidence of aortic stenosis.  7. The inferior vena cava is dilated in size with >50% respiratory variability, suggesting right atrial pressure of 8 mmHg. FINDINGS  Left Ventricle: There is hypokinesis of  the lateral wall, distal anterior and akinesis of the distal inferior, distal inferolateral, distal inferoseptal and apical walls. Left ventricular ejection fraction, by estimation, is 45%%. The left ventricle has  mildly decreased function. The left ventricular internal cavity size was mildly dilated. There is mild left ventricular hypertrophy. Left ventricular diastolic parameters are consistent with Grade II diastolic dysfunction (pseudonormalization). Right Ventricle: The right ventricular size is normal. Right vetricular wall thickness was  not assessed. Right ventricular systolic function is low normal. There is moderately elevated pulmonary artery systolic pressure. The tricuspid regurgitant velocity is 3.06 m/s, and with an assumed right atrial pressure of 8 mmHg, the estimated right ventricular systolic pressure is 14.4 mmHg. Left Atrium: Left atrial size was moderately dilated. Right Atrium: Right atrial size was normal in size. Pericardium: Trivial pericardial effusion is present. Mitral Valve: There is mild thickening of the mitral valve leaflet(s). There is mild calcification of the mitral valve leaflet(s). Mild to moderate mitral annular calcification. Mild mitral valve regurgitation. MV peak gradient, 13.4 mmHg. The mean mitral valve gradient is 5.0 mmHg. Tricuspid Valve: The tricuspid valve is normal in structure. Tricuspid valve regurgitation is mild. Aortic Valve: The aortic valve is tricuspid. Aortic valve regurgitation is not visualized. Mild to moderate aortic valve sclerosis/calcification is present, without any evidence of aortic stenosis. Aortic valve mean gradient measures 7.0 mmHg. Aortic valve peak gradient measures 12.8 mmHg. Aortic valve area, by VTI measures 1.81 cm. Pulmonic Valve: The pulmonic valve was normal in structure. Pulmonic valve regurgitation is trivial. Aorta: The aortic root and ascending aorta are structurally normal, with no evidence of dilitation. Venous: The inferior  vena cava is dilated in size with greater than 50% respiratory variability, suggesting right atrial pressure of 8 mmHg. IAS/Shunts: No atrial level shunt detected by color flow Doppler.  LEFT VENTRICLE PLAX 2D LVIDd:         5.28 cm      Diastology LVIDs:         4.00 cm      LV e' medial:    8.76 cm/s LV PW:         1.26 cm      LV E/e' medial:  18.7 LV IVS:        1.11 cm      LV e' lateral:   10.20 cm/s LVOT diam:     2.00 cm      LV E/e' lateral: 16.1 LV SV:         84 LV SV Index:   46 LVOT Area:     3.14 cm  LV Volumes (MOD) LV vol d, MOD A2C: 136.0 ml LV vol d, MOD A4C: 114.0 ml LV vol s, MOD A2C: 69.1 ml LV vol s, MOD A4C: 67.1 ml LV SV MOD A2C:     66.9 ml LV SV MOD A4C:     114.0 ml LV SV MOD BP:      61.6 ml RIGHT VENTRICLE RV Basal diam:  3.24 cm RV Mid diam:    2.50 cm RV S prime:     15.30 cm/s TAPSE (M-mode): 2.6 cm LEFT ATRIUM             Index       RIGHT ATRIUM           Index LA diam:        4.80 cm 2.62 cm/m  RA Area:     15.90 cm LA Vol (A2C):   93.6 ml 51.11 ml/m RA Volume:   40.90 ml  22.33 ml/m LA Vol (A4C):   88.3 ml 48.22 ml/m LA Biplane Vol: 96.4 ml 52.64 ml/m  AORTIC VALVE AV Area (Vmax):    1.90 cm AV Area (Vmean):   1.78 cm AV Area (VTI):     1.81 cm AV Vmax:           179.00 cm/s AV Vmean:  124.000 cm/s AV VTI:            0.463 m AV Peak Grad:      12.8 mmHg AV Mean Grad:      7.0 mmHg LVOT Vmax:         108.00 cm/s LVOT Vmean:        70.300 cm/s LVOT VTI:          0.267 m LVOT/AV VTI ratio: 0.58  AORTA Ao Root diam: 3.00 cm Ao Asc diam:  3.00 cm MITRAL VALVE                TRICUSPID VALVE MV Area (PHT): 3.91 cm     TR Peak grad:   37.5 mmHg MV Area VTI:   1.94 cm     TR Vmax:        306.00 cm/s MV Peak grad:  13.4 mmHg MV Mean grad:  5.0 mmHg     SHUNTS MV Vmax:       1.83 m/s     Systemic VTI:  0.27 m MV Vmean:      104.0 cm/s   Systemic Diam: 2.00 cm MV Decel Time: 194 msec MV E velocity: 164.00 cm/s MV A velocity: 102.00 cm/s MV E/A ratio:  1.61 Dorris Carnes MD  Electronically signed by Dorris Carnes MD Signature Date/Time: 10/15/2020/9:18:51 PM    Final         Discharge Exam: Vitals:   10/20/20 1015 10/20/20 1100  BP:    Pulse:    Resp:    Temp:    SpO2: 95% 94%   Vitals:   10/20/20 0500 10/20/20 0810 10/20/20 1015 10/20/20 1100  BP:      Pulse:  67    Resp:      Temp:      TempSrc:      SpO2:   95% 94%  Weight: 75.5 kg     Height:        General: Pt is alert, awake, not in acute distress Cardiovascular: RRR, S1/S2 +, no rubs, no gallops Respiratory: fine bibasilar crackles. No wheeze Abdominal: Soft, NT, ND, bowel sounds + Extremities: trace LEedema, no cyanosis   The results of significant diagnostics from this hospitalization (including imaging, microbiology, ancillary and laboratory) are listed below for reference.    Significant Diagnostic Studies: DG Chest 2 View  Result Date: 10/15/2020 CLINICAL DATA:  Chest pain and shortness of breath EXAM: CHEST - 2 VIEW COMPARISON:  11/30/2018 FINDINGS: Cardiac shadow is enlarged but stable. Vascular congestion is noted with patchy airspace disease likely representing parenchymal edema. Small posterior effusions are noted. No pneumothorax is noted. No bony abnormality is seen. IMPRESSION: Changes consistent with CHF with patchy parenchymal edema. Electronically Signed   By: Inez Catalina M.D.   On: 10/15/2020 00:50   DG CHEST PORT 1 VIEW  Result Date: 10/15/2020 CLINICAL DATA:  Intermittent chest pain and shortness of breath EXAM: PORTABLE CHEST 1 VIEW COMPARISON:  10/15/2020 12:20 a.m. FINDINGS: Unchanged enlarged cardiac silhouette. Redemonstrated vascular congestion and patchy airspace opacities. Trace pleural effusions. No pneumothorax. No acute osseous abnormality. IMPRESSION: Overall unchanged patchy parenchymal opacities, concerning for edema. Electronically Signed   By: Merilyn Baba MD   On: 10/15/2020 16:07   ECHOCARDIOGRAM COMPLETE  Result Date: 10/15/2020    ECHOCARDIOGRAM  REPORT   Patient Name:   Sabrina Wheeler Date of Exam: 10/15/2020 Medical Rec #:  [id number]        Height:  62.0 in Accession #:    [id number]       Weight:       180.8 lb Date of Birth:  December 08, 1945        BSA:          1.831 m Patient Age:    55 years         BP:           149/61 mmHg Patient Gender: F                HR:           62 bpm. Exam Location:  [facility] Procedure: 2D Echo, Cardiac Doppler and Color Doppler Indications:    CHF-Acute Diastolic  History:        Patient has prior history of Echocardiogram examinations, most                 recent 01/14/2016. CHF, CAD, Arrythmias:LBBB,                 Signs/Symptoms:Shortness of Breath; Risk Factors:Hypertension                 and Dyslipidemia.  Sonographer:    Wenda Low Referring Phys: [phone] Asa Baudoin IMPRESSIONS  1. Compared to echo report from 2017, LVEF is down and wall motion changes are new.  2. There is hypokinesis of the lateral wall, distal anterior and akinesis of the distal inferior, distal inferolateral, distal inferoseptal and apical walls . Left ventricular ejection fraction, by estimation, is 45%. The left ventricular internal cavity size was mildly dilated. There is mild left ventricular hypertrophy. Left ventricular diastolic parameters are consistent with Grade II diastolic dysfunction (pseudonormalization).  3. Right ventricular systolic function is low normal. The right ventricular size is normal. There is moderately elevated pulmonary artery systolic pressure.  4. Left atrial size was moderately dilated.  5. Mild mitral valve regurgitation.  6. The aortic valve is tricuspid. Aortic valve regurgitation is not visualized. Mild to moderate aortic valve sclerosis/calcification is present, without any evidence of aortic stenosis.  7. The inferior vena cava is dilated in size with >50% respiratory variability, suggesting right atrial pressure of 8 mmHg. FINDINGS  Left Ventricle: There is hypokinesis of the lateral wall, distal  anterior and akinesis of the distal inferior, distal inferolateral, distal inferoseptal and apical walls. Left ventricular ejection fraction, by estimation, is 45%%. The left ventricle has  mildly decreased function. The left ventricular internal cavity size was mildly dilated. There is mild left ventricular hypertrophy. Left ventricular diastolic parameters are consistent with Grade II diastolic dysfunction (pseudonormalization). Right Ventricle: The right ventricular size is normal. Right vetricular wall thickness was not assessed. Right ventricular systolic function is low normal. There is moderately elevated pulmonary artery systolic pressure. The tricuspid regurgitant velocity is 3.06 m/s, and with an assumed right atrial pressure of 8 mmHg, the estimated right ventricular systolic pressure is 79.0 mmHg. Left Atrium: Left atrial size was moderately dilated. Right Atrium: Right atrial size was normal in size. Pericardium: Trivial pericardial effusion is present. Mitral Valve: There is mild thickening of the mitral valve leaflet(s). There is mild calcification of the mitral valve leaflet(s). Mild to moderate mitral annular calcification. Mild mitral valve regurgitation. MV peak gradient, 13.4 mmHg. The mean mitral valve gradient is 5.0 mmHg. Tricuspid Valve: The tricuspid valve is normal in structure. Tricuspid valve regurgitation is mild. Aortic Valve: The aortic valve is tricuspid. Aortic valve regurgitation is not visualized. Mild to moderate aortic  valve sclerosis/calcification is present, without any evidence of aortic stenosis. Aortic valve mean gradient measures 7.0 mmHg. Aortic valve peak gradient measures 12.8 mmHg. Aortic valve area, by VTI measures 1.81 cm. Pulmonic Valve: The pulmonic valve was normal in structure. Pulmonic valve regurgitation is trivial. Aorta: The aortic root and ascending aorta are structurally normal, with no evidence of dilitation. Venous: The inferior vena cava is dilated in  size with greater than 50% respiratory variability, suggesting right atrial pressure of 8 mmHg. IAS/Shunts: No atrial level shunt detected by color flow Doppler.  LEFT VENTRICLE PLAX 2D LVIDd:         5.28 cm      Diastology LVIDs:         4.00 cm      LV e' medial:    8.76 cm/s LV PW:         1.26 cm      LV E/e' medial:  18.7 LV IVS:        1.11 cm      LV e' lateral:   10.20 cm/s LVOT diam:     2.00 cm      LV E/e' lateral: 16.1 LV SV:         84 LV SV Index:   46 LVOT Area:     3.14 cm  LV Volumes (MOD) LV vol d, MOD A2C: 136.0 ml LV vol d, MOD A4C: 114.0 ml LV vol s, MOD A2C: 69.1 ml LV vol s, MOD A4C: 67.1 ml LV SV MOD A2C:     66.9 ml LV SV MOD A4C:     114.0 ml LV SV MOD BP:      61.6 ml RIGHT VENTRICLE RV Basal diam:  3.24 cm RV Mid diam:    2.50 cm RV S prime:     15.30 cm/s TAPSE (M-mode): 2.6 cm LEFT ATRIUM             Index       RIGHT ATRIUM           Index LA diam:        4.80 cm 2.62 cm/m  RA Area:     15.90 cm LA Vol (A2C):   93.6 ml 51.11 ml/m RA Volume:   40.90 ml  22.33 ml/m LA Vol (A4C):   88.3 ml 48.22 ml/m LA Biplane Vol: 96.4 ml 52.64 ml/m  AORTIC VALVE AV Area (Vmax):    1.90 cm AV Area (Vmean):   1.78 cm AV Area (VTI):     1.81 cm AV Vmax:           179.00 cm/s AV Vmean:          124.000 cm/s AV VTI:            0.463 m AV Peak Grad:      12.8 mmHg AV Mean Grad:      7.0 mmHg LVOT Vmax:         108.00 cm/s LVOT Vmean:        70.300 cm/s LVOT VTI:          0.267 m LVOT/AV VTI ratio: 0.58  AORTA Ao Root diam: 3.00 cm Ao Asc diam:  3.00 cm MITRAL VALVE                TRICUSPID VALVE MV Area (PHT): 3.91 cm     TR Peak grad:   37.5 mmHg MV Area VTI:   1.94 cm     TR Vmax:  306.00 cm/s MV Peak grad:  13.4 mmHg MV Mean grad:  5.0 mmHg     SHUNTS MV Vmax:       1.83 m/s     Systemic VTI:  0.27 m MV Vmean:      104.0 cm/s   Systemic Diam: 2.00 cm MV Decel Time: 194 msec MV E velocity: 164.00 cm/s MV A velocity: 102.00 cm/s MV E/A ratio:  1.61 Dorris Carnes MD Electronically signed by  Dorris Carnes MD Signature Date/Time: 10/15/2020/9:18:51 PM    Final     Microbiology: Recent Results (from the past 240 hour(s))  Resp Panel by RT-PCR (Flu A&B, Covid) Nasopharyngeal Swab     Status: None   Collection Time: 10/15/20  3:40 AM   Specimen: Nasopharyngeal Swab; Nasopharyngeal(NP) swabs in vial transport medium  Result Value Ref Range Status   SARS Coronavirus 2 by RT PCR NEGATIVE NEGATIVE Final    Comment: (NOTE) SARS-CoV-2 target nucleic acids are NOT DETECTED.  The SARS-CoV-2 RNA is generally detectable in upper respiratory specimens during the acute phase of infection. The lowest concentration of SARS-CoV-2 viral copies this assay can detect is 138 copies/mL. A negative result does not preclude SARS-Cov-2 infection and should not be used as the sole basis for treatment or other patient management decisions. A negative result may occur with  improper specimen collection/handling, submission of specimen other than nasopharyngeal swab, presence of viral mutation(s) within the areas targeted by this assay, and inadequate number of viral copies(<138 copies/mL). A negative result must be combined with clinical observations, patient history, and epidemiological information. The expected result is Negative.  Fact Sheet for Patients:  EntrepreneurPulse.com.au  Fact Sheet for Healthcare Providers:  IncredibleEmployment.be  This test is no t yet approved or cleared by the [place] FDA and  has been authorized for detection and/or diagnosis of SARS-CoV-2 by FDA under an Emergency Use Authorization (EUA). This EUA will remain  in effect (meaning this test can be used) for the duration of the COVID-19 declaration under Section 564(b)(1) of the Act, 21 U.S.C.section 360bbb-3(b)(1), unless the authorization is terminated  or revoked sooner.       Influenza A by PCR NEGATIVE NEGATIVE Final   Influenza B by PCR NEGATIVE NEGATIVE Final     Comment: (NOTE) The Xpert Xpress SARS-CoV-2/FLU/RSV plus assay is intended as an aid in the diagnosis of influenza from Nasopharyngeal swab specimens and should not be used as a sole basis for treatment. Nasal washings and aspirates are unacceptable for Xpert Xpress SARS-CoV-2/FLU/RSV testing.  Fact Sheet for Patients: EntrepreneurPulse.com.au  Fact Sheet for Healthcare Providers: IncredibleEmployment.be  This test is not yet approved or cleared by the [place] FDA and has been authorized for detection and/or diagnosis of SARS-CoV-2 by FDA under an Emergency Use Authorization (EUA). This EUA will remain in effect (meaning this test can be used) for the duration of the COVID-19 declaration under Section 564(b)(1) of the Act, 21 U.S.C. section 360bbb-3(b)(1), unless the authorization is terminated or revoked.  Performed at [facility], [address], [place],  [address]   MRSA Next Gen by PCR, Nasal     Status: None   Collection Time: 10/15/20  6:34 PM   Specimen: Nasal Mucosa; Nasal Swab  Result Value Ref Range Status   MRSA by PCR Next Gen NOT DETECTED NOT DETECTED Final    Comment: (NOTE) The GeneXpert MRSA Assay (FDA approved for NASAL specimens only), is one component of a comprehensive MRSA colonization surveillance program. It is  not intended to diagnose MRSA infection nor to guide or monitor treatment for MRSA infections. Test performance is not FDA approved in patients less than 89 years old. Performed at [facility], [address], [place], [place] [address]   Culture, blood (Routine X 2) w Reflex to ID Panel     Status: None (Preliminary result)   Collection Time: 10/16/20  8:43 AM   Specimen: Right Antecubital; Blood  Result Value Ref Range Status   Specimen Description RIGHT ANTECUBITAL  Final   Special Requests   Final    BOTTLES DRAWN AEROBIC AND ANAEROBIC Blood Culture adequate volume   Culture   Final    NO  GROWTH 2 DAYS Performed at [facility], [address], [place], [place] [address]    Report Status PENDING  Incomplete  Culture, blood (Routine X 2) w Reflex to ID Panel     Status: None (Preliminary result)   Collection Time: 10/16/20  8:44 AM   Specimen: BLOOD RIGHT HAND  Result Value Ref Range Status   Specimen Description BLOOD RIGHT HAND  Final   Special Requests   Final    BOTTLES DRAWN AEROBIC AND ANAEROBIC Blood Culture adequate volume   Culture   Final    NO GROWTH 2 DAYS Performed at [facility], [address] [place], [place] [address]    Report Status PENDING  Incomplete  Urine Culture     Status: Abnormal   Collection Time: 10/16/20  2:39 PM   Specimen: Urine, Clean Catch  Result Value Ref Range Status   Specimen Description   Final    URINE, CLEAN CATCH Performed at [facility], [address], [place], [place] [address]    Special Requests   Final    NONE Performed at [facility], [address] [place], [place] [address]    Culture MULTIPLE SPECIES PRESENT, SUGGEST RECOLLECTION (A)  Final   Report Status 10/18/2020 FINAL  Final     Labs: Basic Metabolic Panel: Recent Labs  Lab 10/16/20 0344 10/17/20 0404 10/18/20 0344 10/19/20 0330 10/20/20 0556  NA 138 140 138 138 140  K 4.5 4.2 4.2 4.3 4.4  CL 109 107 110 109 108  CO2 20* 22 22 23 22   GLUCOSE 104* 149* 116* 107* 102*  BUN 49* 52* 63* 73* 78*  CREATININE 2.78* 2.91* 3.33* 3.75* 3.61*  CALCIUM 8.5* 8.6* 8.3* 7.8* 8.1*  MG  --  1.6*  --   --   --    Liver Function Tests: No results for input(s): AST, ALT, ALKPHOS, BILITOT, PROT, ALBUMIN in the last 168 hours. No results for input(s): LIPASE, AMYLASE in the last 168 hours. No results for input(s): AMMONIA in the last 168 hours. CBC: Recent Labs  Lab 10/15/20 0018 10/16/20 0344 10/17/20 0404 10/18/20 0344 10/19/20 0330 10/20/20 0556  WBC 13.0* 11.8* 14.9* 10.8* 8.3 7.3  NEUTROABS 10.8*  --   --   --   --   --   HGB 8.1* 8.3* 8.8* 7.8*  7.4* 7.0*  HCT 25.3* 26.4* 27.7* 25.2* 24.1* 23.1*  MCV 93.7 93.6 92.0 94.0 93.4 96.3  PLT 275 286 310 249 259 245   Cardiac Enzymes: No results for input(s): CKTOTAL, CKMB, CKMBINDEX, TROPONINI in the last 168 hours. BNP: Invalid input(s): POCBNP CBG: No results for input(s): GLUCAP in the last 168 hours.  Time coordinating discharge:  36 minutes  Signed:  Orson Eva, DO [facility] Pager: [phone] 10/20/2020, 11:35 AM

## 2020-10-20 NOTE — Progress Notes (Signed)
Progress Note  Patient Name: Sabrina Wheeler Date of Encounter: 10/20/2020  [facility] Cardiologist: Carlyle Dolly, MD   Subjective   Patient denies any complaints.  No chest discomfort and no shortness of breath. Says she wants to go home!  Does not like being in the hospital and her husband's birthday is coming up" I want to see where my dog was buried".  Inpatient Medications    Scheduled Meds:  aspirin  325 mg Oral Daily   atorvastatin  80 mg Oral Daily   azithromycin  500 mg Oral Daily   carvedilol  12.5 mg Oral BID WC   Chlorhexidine Gluconate Cloth  6 each Topical Daily   donepezil  5 mg Oral QHS   hydrALAZINE  25 mg Oral Q8H   nitroGLYCERIN  0.4 mg Transdermal Daily   sodium chloride flush  3 mL Intravenous Q12H   Continuous Infusions:  sodium chloride     cefTRIAXone (ROCEPHIN)  IV 1 g (10/20/20 0820)   PRN Meds: sodium chloride, acetaminophen, ondansetron (ZOFRAN) IV, sodium chloride flush   Vital Signs    Vitals:   10/19/20 2100 10/20/20 0428 10/20/20 0500 10/20/20 0810  BP: (!) 134/48 (!) 136/57    Pulse: (!) 57 (!) 59  67  Resp: 20 18    Temp: 97.7 F (36.5 C) 97.9 F (36.6 C)    TempSrc: Oral Oral    SpO2: 98% 96%    Weight:   75.5 kg   Height:        Intake/Output Summary (Last 24 hours) at 10/20/2020 0942 Last data filed at 10/20/2020 0931 Gross per 24 hour  Intake 1453 ml  Output 900 ml  Net 553 ml   Last 3 Weights 10/20/2020 10/18/2020 10/17/2020  Weight (lbs) 166 lb 7.2 oz 172 lb 6.4 oz 169 lb 1.5 oz  Weight (kg) 75.5 kg 78.2 kg 76.7 kg      Telemetry    No significant arrhythmia personally Reviewed  ECG    Normal sinus rhythm, without significant ectopy- Personally Reviewed  Physical Exam  Sitting up in chair, GEN: No acute distress.   Neck: No JVD Cardiac: RRR, no murmurs, rubs, or gallops.  Respiratory: A few crackles noted left base otherwise clear. GI: Soft, nontender, non-distended  MS: No edema; edema resolved.   Leg still bandaged. Neuro:  Nonfocal  Psych: Normal affect   Labs    High Sensitivity Troponin:   Recent Labs  Lab 10/15/20 0018 10/15/20 0243 10/16/20 0844  TROPONINIHS 36* 102* 243*      Chemistry Recent Labs  Lab 10/18/20 0344 10/19/20 0330 10/20/20 0556  NA 138 138 140  K 4.2 4.3 4.4  CL 110 109 108  CO2 22 23 22   GLUCOSE 116* 107* 102*  BUN 63* 73* 78*  CREATININE 3.33* 3.75* 3.61*  CALCIUM 8.3* 7.8* 8.1*  GFRNONAA 14* 12* 13*  ANIONGAP 6 6 10      Hematology Recent Labs  Lab 10/18/20 0344 10/19/20 0330 10/20/20 0556  WBC 10.8* 8.3 7.3  RBC 2.68* 2.58* 2.40*  HGB 7.8* 7.4* 7.0*  HCT 25.2* 24.1* 23.1*  MCV 94.0 93.4 96.3  MCH 29.1 28.7 29.2  MCHC 31.0 30.7 30.3  RDW 13.2 13.0 13.1  PLT 249 259 245    BNP Recent Labs  Lab 10/15/20 0018  BNP 527.0*     DDimer No results for input(s): DDIMER in the last 168 hours.   Radiology    No results found.  Assessment & Plan   Congestive heart failure, recent worsening unknown etiology             echo shows slight reduction in EF to 45% 2.  Renal insufficiency, significant, chronic but some recent worsening 3.  Diabetes mellitus type II 4.  Dyslipidemia on therapy 5.  Lower extremity wounds ,right foot area is chronic whereas superficial on left shin is more recent  Plan: Patient may be insistent on going home.  With her slight worsening renal function, decreased hemoglobin I am not sure that this is best for her but she may be insistent.  The rationale for her going home is in itself a bit irrational.  (See above).  It may be difficult to determine the exact dosage of diuretic which is currently being held due to possible slight overdiuresis and worsening renal function and transition her to outpatient management.  This would be the reason to keep her in the hospital if she would agree. She has seen Dr. Harl Bowie in the past and would recommend that she follow-up with him as an outpatient.   Likely will need nephrology assistance at some point in the future as well.  Wound care recommendations should be followed. [facility] will sign off.  Please call if we can be of further help. Medication Recommendations:     For questions or updates, please contact [place] Please consult [email or web address] for contact info under        Signed, Abel Presto, MD  10/20/2020, 9:42 AM

## 2020-10-20 NOTE — Progress Notes (Signed)
Cross-coverage note:   Patient became delirious tonight and delirium precautions were instituted. Despite non-pharmacologic interventions, she has become agitated, cannot be redirected. Plan to give a dose of Haldol.

## 2020-10-20 NOTE — Progress Notes (Signed)
Physical Therapy Treatment Patient Details Name: Sabrina Wheeler MRN: [id number] DOB: 06-02-1945 Today's Date: 10/20/2020    History of Present Illness Sabrina Wheeler is a 75 y.o. female with medical history of hypertension, diabetes mellitus, hyperlipidemia, CKD presenting with 2 to 3-day history of shortness of breath and chest pain.  The patient is a difficult historian at best.  She states that she has been having substernal chest pain and shortness of breath for the past 2 to 3 days.  She states that her chest pain occurs even at rest.  She states that she has chronic lower extremity edema but thinks that it is about the same as usual.  She has a nonproductive cough.  She denies any fevers, chills, headache, neck pain, nausea, vomiting or diarrhea.  She has not had any hemoptysis, hematochezia, melena.  The patient states that she normally sleeps in a recliner and has been doing so for the last 2 to 3 years.  She states that she has not taken any prescription medication for the past 3 years.  Apparently, the patient's primary care provider, the patient has not reestablished with any provider since then.  Upon EMS arrival, the patient was noted to have oxygen saturation 85% on room air.  In the emergency department, the patient was afebrile and hemodynamically stable with oxygen saturation 90% on room air.  She was placed on 2 L with oxygen saturation up to 100%.  BMP shows sodium 138, potassium 4.8, serum creatinine 2.84.  WBC 13.0, hemoglobin 8.1, platelets 275,000.  Troponin 36>>102.  Chest x-ray showed bilateral patchy opacities.  She was started on IV heparin.    PT Comments    Patient presents seated in chair (assisted by nursing staff) and agreeable for therapy.  Patient demonstrates slow labored movement with fair/good return using quad-cane to transfer to bed, but c/o increased pain in feet due to wounds and advised to use RW for safety and to limit body weight on feet.  Patient  demonstrates good return for ambulation in room/hallway using RW without loss of balance and required verbal cueing for safely making turns with RW.  Patient demonstrates increased endurance/distance for gait training and tolerated staying up in chair after therapy - nursing staff aware.  Patient will benefit from continued physical therapy in hospital and recommended venue below to increase strength, balance, endurance for safe ADLs and gait.     Follow Up Recommendations  Home health PT;Supervision for mobility/OOB     Equipment Recommendations  None recommended by PT    Recommendations for Other Services       Precautions / Restrictions Precautions Precautions: Fall Restrictions Weight Bearing Restrictions: No    Mobility  Bed Mobility               General bed mobility comments: patient presents seated in chair (assisted by nurisng staff)    Transfers Overall transfer level: Needs assistance Equipment used: Rolling walker (2 wheeled);Quad cane Transfers: Sit to/from [employer] to Stand: Supervision Stand pivot transfers: Supervision;Min guard       General transfer comment: slow labored movement with good return for using quad-cane and RW  Ambulation/Gait Ambulation/Gait assistance: Supervision;Min guard Gait Distance (Feet): 75 Feet Assistive device: Rolling walker (2 wheeled) Gait Pattern/deviations: Decreased step length - right;Decreased step length - left;Decreased stride length Gait velocity: decreased   General Gait Details: slow labored cadence using RW without loss of balance, occasional repeated verbal cueing for following directions, limited mostly due  to c/o fatigue and feet pain   Stairs             Wheelchair Mobility    Modified Rankin (Stroke Patients Only)       Balance Overall balance assessment: Needs assistance Sitting-balance support: Feet supported;No upper extremity supported Sitting balance-Leahy  Scale: Good Sitting balance - Comments: seated at EOB   Standing balance support: During functional activity;Single extremity supported Standing balance-Leahy Scale: Fair Standing balance comment: fair/good using RW, fair using quad-cane                            Cognition Arousal/Alertness: Awake/alert Behavior During Therapy: WFL for tasks assessed/performed Overall Cognitive Status: Within Functional Limits for tasks assessed                                        Exercises      General Comments        Pertinent Vitals/Pain Pain Assessment: Faces Faces Pain Scale: Hurts little more Pain Location: bilateral feet at wound sites Pain Descriptors / Indicators: Sore Pain Intervention(s): Limited activity within patient's tolerance;Monitored during session    Home Living                      Prior Function            PT Goals (current goals can now be found in the care plan section) Acute Rehab PT Goals Patient Stated Goal: return home with home aide to assist PT Goal Formulation: With patient Time For Goal Achievement: 10/22/20 Potential to Achieve Goals: Good Progress towards PT goals: Progressing toward goals    Frequency    Min 3X/week      PT Plan Current plan remains appropriate    Co-evaluation              AM-PAC PT "6 Clicks" Mobility   Outcome Measure  Help needed turning from your back to your side while in a flat bed without using bedrails?: None Help needed moving from lying on your back to sitting on the side of a flat bed without using bedrails?: A Little Help needed moving to and from a bed to a chair (including a wheelchair)?: A Little Help needed standing up from a chair using your arms (e.g., wheelchair or bedside chair)?: A Little Help needed to walk in hospital room?: A Little Help needed climbing 3-5 steps with a railing? : A Lot 6 Click Score: 18    End of Session   Activity Tolerance:  Patient tolerated treatment well;Patient limited by fatigue;Patient limited by pain Patient left: in chair;with call bell/phone within reach Nurse Communication: Mobility status PT Visit Diagnosis: Muscle weakness (generalized) (M62.81);Other abnormalities of gait and mobility (R26.89)     Time: 9323-5573 PT Time Calculation (min) (ACUTE ONLY): 25 min  Charges:  $Gait Training: 8-22 mins $Therapeutic Activity: 8-22 mins                     3:12 PM, 10/20/20 Lonell Grandchild, MPT Physical Therapist with [facility] [phone] office [phone] mobile phone

## 2020-10-21 LAB — TYPE AND SCREEN
ABO/RH(D): A POS
Antibody Screen: NEGATIVE
Unit division: 0

## 2020-10-21 LAB — BASIC METABOLIC PANEL
Anion gap: 9 (ref 5–15)
BUN: 70 mg/dL — ABNORMAL HIGH (ref 8–23)
CO2: 20 mmol/L — ABNORMAL LOW (ref 22–32)
Calcium: 8.3 mg/dL — ABNORMAL LOW (ref 8.9–10.3)
Chloride: 108 mmol/L (ref 98–111)
Creatinine, Ser: 3.28 mg/dL — ABNORMAL HIGH (ref 0.44–1.00)
GFR, Estimated: 14 mL/min — ABNORMAL LOW (ref >60)
Glucose, Bld: 107 mg/dL — ABNORMAL HIGH (ref 70–99)
Potassium: 3.8 mmol/L (ref 3.5–5.1)
Sodium: 137 mmol/L (ref 135–145)

## 2020-10-21 LAB — CULTURE, BLOOD (ROUTINE X 2)
Culture: NO GROWTH
Culture: NO GROWTH
Special Requests: ADEQUATE
Special Requests: ADEQUATE

## 2020-10-21 LAB — BPAM RBC
Blood Product Expiration Date: 202209102359
ISSUE DATE / TIME: 202208151347
Unit Type and Rh: 6200

## 2020-10-21 LAB — CBC
HCT: 26.7 % — ABNORMAL LOW (ref 36.0–46.0)
Hemoglobin: 8.5 g/dL — ABNORMAL LOW (ref 12.0–15.0)
MCH: 29.4 pg (ref 26.0–34.0)
MCHC: 31.8 g/dL (ref 30.0–36.0)
MCV: 92.4 fL (ref 80.0–100.0)
Platelets: 283 10*3/uL (ref 150–400)
RBC: 2.89 MIL/uL — ABNORMAL LOW (ref 3.87–5.11)
RDW: 13.2 % (ref 11.5–15.5)
WBC: 7.8 10*3/uL (ref 4.0–10.5)
nRBC: 0 % (ref 0.0–0.2)

## 2020-10-21 MED ORDER — PANTOPRAZOLE SODIUM 40 MG PO TBEC
40.0000 mg | DELAYED_RELEASE_TABLET | Freq: Two times a day (BID) | ORAL | Status: DC
  Administered 2020-10-21 – 2020-10-25 (×8): 40 mg via ORAL
  Filled 2020-10-21 (×8): qty 1

## 2020-10-21 NOTE — H&P (View-Only) (Signed)
@LOGO @   Referring Provider: [facility] hospitalist Primary Care Physician:  [facility] Primary Gastroenterologist:  Dasher, Merlin  Date of Admission: 10/14/2020 Date of Consultation: 10/21/2020  Reason for Consultation: Anemia, heme positive stool.  HPI:  Sabrina Wheeler is a 75 y.o. year old female  with medical history of hypertension, diabetes mellitus, hyperlipidemia, CKD presenting with 2 to 3-day history of shortness of breath and chest pain.  Noted chronic lower extremity edema and nonproductive cough, sleeping in recliner for 2 to 3 years.Upon EMS arrival, the patient was noted to have oxygen saturation 85% on room air and was placed on 2 L nasal cannula.  She was admitted with acute systolic and diastolic CHF, acute respiratory failure with hypoxia, acute on chronic CKD, elevated troponin, and also noted to have anemia.  During her hospital course, she was diuresed.  She developed respiratory distress requiring nitroglycerin drip and additional Lasix.  Nitroglycerin drip was DC'd on 8/12. Echo with EF 45%, ,+HK lateral wall; +AK inf and inferolateral; G2DD, PASP 45.5.  Overall, felt elevated troponin was secondary to demand ischemia.  She was initially treated with heparin, but this was DC'd after about 48 hours.  Cardiology was on board during admission.  Regarding her acute respiratory failure, she required up to 15 L HFNC, now on RA.  Empirically treated with ceftriaxone and azathioprine.   Regarding acute on chronic kidney disease, he was excepted that creatinine was going to worsen in the setting of IV diuresis.  Diuretics were held starting 8/13 with plans to restart on 8/17.  Regarding anemia, hemoglobin was 8.1 on admission, improved to 8.8 on 8/12, then slowly declined to 7.0 on 8/15.  No overt GI bleeding during hospitalization.  She received 1 unit PRBCs with hemoglobin up to 8.5 today vitamin B12 low normal at 339.  Ferritin 132, iron 16  (L), saturation 7% (L).  Fecal occult blood positive.  She received IV Feraheme and IM B12 x1.  Patient was requesting to go home yesterday and stated she would not have any GI procedures.  However, family is requesting further evaluation, thus GI consult was placed.  Today: Patient is a poor historian.  Denies BRBPR, melena, constipation, diarrhea, abdominal pain, nausea, vomiting, GERD, dysphagia.  Does not know if she is ever had a colonoscopy or EGD.  Denies history of NSAID use.  Does not know if she is on anything for acid reflux.  She is not sure if she would like to have EGD or colonoscopy.  Requested I talk with her family.  Spoke with patient's daughter, [name], patient's POA.  Reports patient lives at home with her husband.  There is a caregiver that comes in for 4 hours a day and her daughter and son-in-law take care of them on the weekends.  Patient is usually able to get up and take care of herself for the most part.  No known history of bright red blood per rectum or melena.  If she had a colonoscopy, states this was many years ago.  States she takes omeprazole twice daily, but cannot tell me be dose.  She also takes aspirin.  Thinks is 81 mg, but 325 mg as listed on medication list.  Daughter would like for patient to have EGD and colonoscopy.  She is on her way back to [place] now.  She was out of town.  Daughter reports patient has history of Alzheimer's and dementia.  Patient's husband also has some Alzheimer's.  Past Medical History:  Diagnosis Date   Broken ankle 2008   left    CHF (congestive heart failure) (HCC)    Diabetes mellitus without complication ([place])    Hypertension    Obesity    Renal disorder    Stage 4 kidney disease    Past Surgical History:  Procedure Laterality Date   BILE DUCT STENT PLACEMENT     INCISIONAL HERNIA REPAIR      Prior to Admission medications   Medication Sig Start Date End Date Taking? Authorizing Provider  amLODipine (NORVASC) 10  MG tablet Take 10 mg by mouth daily.   Yes [provider]  Ascorbic Acid (VITAMIN C CR) 1000 MG TBCR Take 500 mg by mouth daily. 06/13/20  Yes [provider]  atorvastatin (LIPITOR) 80 MG tablet Take 1 tablet by mouth daily. 09/25/18  Yes [provider]  Calcium Carb-Cholecalciferol 600-800 MG-UNIT TABS Take 1 tablet by mouth at bedtime.   Yes [provider]  carvedilol (COREG) 6.25 MG tablet Take 6.25 mg by mouth 2 (two) times daily with a meal.   Yes [provider]  CINNAMON PO Take 1 tablet by mouth daily.   Yes [provider]  donepezil (ARICEPT) 5 MG tablet Take 5 mg by mouth daily. 10/01/20  Yes [provider]  escitalopram (LEXAPRO) 10 MG tablet Take 10 mg by mouth daily.   Yes [provider]  lisinopril (ZESTRIL) 5 MG tablet Take 5 mg by mouth daily.   Yes [provider]  Multiple Vitamins-Minerals (MULTIVITAMIN WITH MINERALS) tablet Take 1 tablet by mouth daily.   Yes [provider]  sodium bicarbonate 650 MG tablet Take 650 mg by mouth 2 (two) times daily. 10/01/20  Yes [provider]  triamcinolone ointment (KENALOG) 0.1 % Apply 1 application topically 2 (two) times daily.   Yes [provider]  TURMERIC PO Take 1 tablet by mouth daily.   Yes [provider]  amoxicillin (AMOXIL) 500 MG capsule Take 1 capsule (500 mg total) by mouth 3 (three) times daily. Patient not taking: No sig reported 04/08/20   Triplett, Tammy, PA-C  aspirin 325 MG tablet Take 1 tablet (325 mg total) by mouth daily. 10/20/20   Orson Eva, MD  carvedilol (COREG) 12.5 MG tablet Take 1 tablet (12.5 mg total) by mouth 2 (two) times daily with a meal. 10/20/20   Tat, Shanon Brow, MD  cephALEXin (KEFLEX) 250 MG capsule Take 250 mg by mouth in the morning, at noon, and at bedtime. 09/29/20   [provider]  furosemide (LASIX) 40 MG tablet Take 1 tablet (40 mg total) by mouth daily. 10/22/20   Orson Eva, MD  hydrALAZINE (APRESOLINE) 25 MG tablet Take 1 tablet (25 mg total) by mouth every 8 (eight) hours. 10/20/20   Orson Eva, MD  minocycline (MINOCIN) 100 MG capsule Take 1 capsule (100 mg total) by mouth 2 (two) times daily. Patient not taking: No sig reported 04/08/20   Triplett, Tammy, PA-C  ondansetron (ZOFRAN) 4 MG tablet Take 1 tablet (4 mg total) by mouth every 6 (six) hours. As needed for nausea and vomiting Patient not taking: No sig reported 04/08/20   Triplett, Tammy, PA-C  vitamin B-12 (CYANOCOBALAMIN) 500 MCG tablet Take 1 tablet (500 mcg total) by mouth daily. 10/20/20   Orson Eva, MD    Current Facility-Administered Medications  Medication Dose Route Frequency Provider Last Rate Last Admin   0.9 %  sodium chloride infusion  250  mL Intravenous PRN Tat, Shanon Brow, MD       acetaminophen (TYLENOL) tablet 650 mg  650 mg Oral Q4H PRN Tat, David, MD       aspirin tablet 325 mg  325 mg Oral Daily Tat, David, MD   325 mg at 10/21/20 0810   atorvastatin (LIPITOR) tablet 80 mg  80 mg Oral Daily Tat, Shanon Brow, MD   80 mg at 10/21/20 0809   azithromycin (ZITHROMAX) tablet 500 mg  500 mg Oral Daily Tat, David, MD   500 mg at 10/21/20 0810   carvedilol (COREG) tablet 12.5 mg  12.5 mg Oral BID WC Tat, Shanon Brow, MD   12.5 mg at 10/21/20 0810   cefTRIAXone (ROCEPHIN) 1 g in sodium chloride 0.9 % 100 mL IVPB  1 g Intravenous Q24H Orson Eva, MD 200 mL/hr at 10/21/20 0813 1 g at 10/21/20 0813   Chlorhexidine Gluconate Cloth 2 % PADS 6 each  6 each Topical Daily Tat, David, MD   6 each at 10/21/20 0815   donepezil (ARICEPT) tablet 5 mg  5 mg Oral Benay Pike, MD   5 mg at 10/20/20 2138   [START ON 10/22/2020] furosemide (LASIX) tablet 40 mg  40 mg Oral Daily Tat, David, MD       hydrALAZINE (APRESOLINE) tablet 25 mg  25 mg Oral Franco Collet, MD   25 mg at 10/21/20 0506   nitroGLYCERIN (NITRODUR - Dosed in mg/24 hr) patch 0.4 mg  0.4 mg Transdermal Daily Tat, David, MD   0.4 mg at 10/21/20 0814    ondansetron (ZOFRAN) injection 4 mg  4 mg Intravenous Q6H PRN Tat, Shanon Brow, MD   4 mg at 10/17/20 0818   sodium chloride flush (NS) 0.9 % injection 3 mL  3 mL Intravenous Q12H Tat, Shanon Brow, MD   3 mL at 10/21/20 0815   sodium chloride flush (NS) 0.9 % injection 3 mL  3 mL Intravenous PRN Tat, Shanon Brow, MD       vitamin B-12 (CYANOCOBALAMIN) tablet 500 mcg  500 mcg Oral Daily Tat, David, MD   500 mcg at 10/21/20 0809    Allergies as of 10/14/2020 - Review Complete 10/14/2020  Allergen Reaction Noted   Macrobid [nitrofurantoin monohyd macro] Other (See Comments) 11/08/2013   Sulfamethoxazole-trimethoprim Other (See Comments) 01/10/2019   Doxycycline Nausea And Vomiting 11/08/2013   Clindamycin/lincomycin Rash 11/08/2013    Family History  Problem Relation Age of Onset   Cancer Mother    Heart disease Father    Heart attack Father     Social History   Socioeconomic History   Marital status: Married    Spouse name: Not on file   Number of children: Not on file   Years of education: Not on file   Highest education level: Not on file  Occupational History   Not on file  Tobacco Use   Smoking status: Never   Smokeless tobacco: Never  Vaping Use   Vaping Use: Never used  Substance and Sexual Activity   Alcohol use: No   Drug use: No   Sexual activity: Not Currently  Other Topics Concern   Not on file  Social History Narrative   Not on file   Social Determinants of Health   Financial Resource Strain: Not on file  Food Insecurity: Not on file  Transportation Needs: Not on file  Physical Activity: Not on file  Stress: Not on file  Social Connections: Not on file  Intimate Partner Violence: Not on  file    Review of Systems: Gen: Denies fever, chills, cold or flulike symptoms, presyncope, syncope. CV: Denies chest pain, heart palpitations Resp: Denies shortness of breath or cough. GI: See HPI GU : Denies urinary burning, urinary frequency, urinary incontinence.  MS: Denies  joint pain Derm: Denies rash Psych: Denies depression, anxiety Heme: Denies bruising, bleeding  Physical Exam: Vital signs in last 24 hours: Temp:  [97.5 F (36.4 C)-98.5 F (36.9 C)] 97.5 F (36.4 C) (08/16 0415) Pulse Rate:  [59-66] 66 (08/16 0810) Resp:  [17-18] 18 (08/16 0415) BP: (131-152)/(46-58) 152/55 (08/16 0810) SpO2:  [94 %-99 %] 96 % (08/16 0415) Weight:  [77.5 kg] 77.5 kg (08/16 0415) Last BM Date: 10/18/20 General:   Alert,  Well-developed, well-nourished, pleasant and cooperative in NAD Head:  Normocephalic and atraumatic. Eyes:  Sclera clear, no icterus.   Conjunctiva pink. Ears:  Normal auditory acuity. Lungs:  Clear throughout to auscultation.   No wheezes, crackles, or rhonchi. No acute distress. Heart:  Regular rate and rhythm; no murmurs, clicks, rubs,  or gallops. Abdomen:  Soft, nontender and nondistended. No masses, hepatosplenomegaly or hernias noted. Normal bowel sounds, without guarding, and without rebound.   Rectal:  Deferred  Msk:  Symmetrical without gross deformities. Normal posture. Extremities:  Without edema. Neurologic:  Alert and  oriented to self.  Knows she is at hospital, but cannot tell me why she is here.  Unable to tell me the year, location, or president. Skin:  Intact without significant lesions or rashes. Psych: Normal mood and affect.  Intake/Output from previous day: 08/15 0701 - 08/16 0700 In: 555 [P.O.:240; Blood:315] Out: 100 [Urine:100] Intake/Output this shift: Total I/O In: -  Out: 300 [Urine:300]  Lab Results: Recent Labs    10/19/20 0330 10/20/20 0556 10/21/20 0400  WBC 8.3 7.3 7.8  HGB 7.4* 7.0* 8.5*  HCT 24.1* 23.1* 26.7*  PLT 259 245 283   BMET Recent Labs    10/19/20 0330 10/20/20 0556  NA 138 140  K 4.3 4.4  CL 109 108  CO2 23 22  GLUCOSE 107* 102*  BUN 73* 78*  CREATININE 3.75* 3.61*  CALCIUM 7.8* 8.1*    Impression: 75 y.o. year old female  with medical history of hypertension, diabetes  mellitus, hyperlipidemia, CKD presenting with 2 to 3-day history of shortness of breath and chest pain.  Noted chronic lower extremity edema and nonproductive cough, sleeping in recliner for 2 to 3 years.Upon EMS arrival, the patient was noted to have oxygen saturation 85% on room air and was placed on 2 L nasal cannula.  She was admitted with acute systolic and diastolic CHF, acute respiratory failure with hypoxia, acute on chronic CKD, elevated troponin, and also noted to have anemia. GI consulted due to anemia.   Anemia: Chronic anemia in the setting of CKD with baseline hemoglobin in the 9-10 range.  Hemoglobin 8.1 on admission,  improved to 8.8 on 8/12, then slowly declined to 7.0 on 8/15.  No overt GI bleeding during hospitalization and denies any significant upper or lower GI symptoms.  Per patient's daughter, she is on omeprazole twice daily chronically due to history of reflux.  Takes aspirin ?81 vs 325mg  daily.  No other NSAIDs.  Unknown if she has had a prior colonoscopy.  EGD April 2021 was normal.  She received 1 unit PRBCs with hemoglobin up to 8.5 today. vitamin B12 low normal at 339.  Ferritin 132, iron 16 (L), saturation 7% (L).  Fecal occult blood  positive. She received IV Feraheme and IM B12 x1, now on oral supplementation.    With IDA, would recommend EGD and colonoscopy for further evaluation. Suspect occult, slow bleed that could be coming from essentially anywhere in the GI tract with differentials including gastritis, esophagitis, duodenitis, PUD, AVMs, colon polyps, and malignancy.  Patient was initially requesting to go home yesterday and declined GI procedure; however, family has requested further evaluation.  Notably, patient has history of Alzheimer's/dementia.  Upon further discussion today, patient states she would like me to talk with her family to determine if she should pursue EGD and colonoscopy.  Spoke with patient's daughter, [name], [place], who would like for EGD and colonoscopy  to be completed inpatient.  Will discuss further with Dr. Jenetta Downer.   Acute on chronic kidney disease: Creatinine 2.84 on admission, increased to 3.75 on 8/14 in the setting of IV diuresis.  Diuretics were held starting 8/13 with plans to resume on 8/17.  Creatinine slowly improving.  Creatinine 3.61 yesterday.  BMP pending today.   Regarding acute systolic and diastolic CHF, acute respiratory failure with hypoxia, and elevated troponins, patient is clinically improved.  She has been diuresed.  Echo with EF 45%. Felt elevated troponin was secondary to demand ischemia.  She was initially treated with heparin, but this was DC'd after about 48 hours.  Cardiology was on board during admission.  Regarding her acute respiratory failure, she required up to 15 L HFNC, now on RA.  Empirically treated with ceftriaxone and azathioprine.   Plan: Patient will benefit from EGD and colonoscopy for further evaluation of IDA.  Timing to be determined.  Will discuss with Dr. Jenetta Downer. Clear liquids today as we may need to prep for procedures tomorrow. Start PPI twice daily. Management of AKI per hospitalist.    LOS: 5 days    10/21/2020, 8:18 AM   Aliene Altes, PA-C [facility]

## 2020-10-21 NOTE — Progress Notes (Signed)
Physical Therapy Treatment Patient Details Name: Sabrina Wheeler MRN: [id number] DOB: [birth year] Today's Date: 10/21/2020    History of Present Illness Sabrina Wheeler is a 75 y.o. female with medical history of hypertension, diabetes mellitus, hyperlipidemia, CKD presenting with 2 to 3-day history of shortness of breath and chest pain.  The patient is a difficult historian at best.  She states that she has been having substernal chest pain and shortness of breath for the past 2 to 3 days.  She states that her chest pain occurs even at rest.  She states that she has chronic lower extremity edema but thinks that it is about the same as usual.  She has a nonproductive cough.  She denies any fevers, chills, headache, neck pain, nausea, vomiting or diarrhea.  She has not had any hemoptysis, hematochezia, melena.  The patient states that she normally sleeps in a recliner and has been doing so for the last 2 to 3 years.  She states that she has not taken any prescription medication for the past 3 years.  Apparently, the patient's primary care provider, the patient has not reestablished with any provider since then.  Upon EMS arrival, the patient was noted to have oxygen saturation 85% on room air.  In the emergency department, the patient was afebrile and hemodynamically stable with oxygen saturation 90% on room air.  She was placed on 2 L with oxygen saturation up to 100%.  BMP shows sodium 138, potassium 4.8, serum creatinine 2.84.  WBC 13.0, hemoglobin 8.1, platelets 275,000.  Troponin 36>>102.  Chest x-ray showed bilateral patchy opacities.  She was started on IV heparin.    PT Comments    Patient presents alert, cooperative and agreeable for therapy.  Patient demonstrates fair/good return for completing BLE ROM/strengthening exercises with occasional verbal cueing and demonstration while seated at bedside, slightly increased endurance/distance for gait training without loss of balance, limited mostly due  to fatigue and tolerated sitting up in chair after therapy - nursing staff notified.  Patient will benefit from continued physical therapy in hospital and recommended venue below to increase strength, balance, endurance for safe ADLs and gait.     Follow Up Recommendations  Home health PT;Supervision for mobility/OOB     Equipment Recommendations  None recommended by PT    Recommendations for Other Services       Precautions / Restrictions Precautions Precautions: Fall Restrictions Weight Bearing Restrictions: No    Mobility  Bed Mobility Overal bed mobility: Needs Assistance Bed Mobility: Supine to Sit     Supine to sit: Min guard;Min assist     General bed mobility comments: increased time, labored movement    Transfers Overall transfer level: Needs assistance Equipment used: Rolling walker (2 wheeled) Transfers: Sit to/from [facility] Sit to Stand: Supervision         General transfer comment: slightly labored movement without loss of balance  Ambulation/Gait Ambulation/Gait assistance: Supervision Gait Distance (Feet): 80 Feet Assistive device: Rolling walker (2 wheeled) Gait Pattern/deviations: Decreased step length - right;Decreased step length - left;Decreased stride length Gait velocity: decreased   General Gait Details: slightly labored cadence with improvement for making turns, limited mostly due to fatigue, no loss of balance   Stairs             Wheelchair Mobility    Modified Rankin (Stroke Patients Only)       Balance Overall balance assessment: Needs assistance Sitting-balance support: Feet supported;No upper extremity supported Sitting balance-Leahy Scale:  Good Sitting balance - Comments: seated at EOB   Standing balance support: During functional activity;Single extremity supported Standing balance-Leahy Scale: Fair Standing balance comment: using RW                            Cognition  Arousal/Alertness: Awake/alert   Overall Cognitive Status: Within Functional Limits for tasks assessed                                        Exercises General Exercises - Lower Extremity Long Arc Quad: Seated;AROM;Strengthening;Both;10 reps Hip Flexion/Marching: Seated;AROM;Strengthening;Both;10 reps Toe Raises: Seated;AROM;Strengthening;Both;10 reps Heel Raises: Seated;AROM;Strengthening;10 reps;Both    General Comments        Pertinent Vitals/Pain Pain Assessment: Faces Faces Pain Scale: Hurts a little bit Pain Location: bilateral feet at wound sites Pain Descriptors / Indicators: Sore Pain Intervention(s): Limited activity within patient's tolerance;Monitored during session;Repositioned    Home Living                      Prior Function            PT Goals (current goals can now be found in the care plan section) Acute Rehab PT Goals Patient Stated Goal: return home with home aide to assist Time For Goal Achievement: 10/22/20 Potential to Achieve Goals: Good Progress towards PT goals: Progressing toward goals    Frequency    Min 3X/week      PT Plan Current plan remains appropriate    Co-evaluation              AM-PAC PT "6 Clicks" Mobility   Outcome Measure  Help needed turning from your back to your side while in a flat bed without using bedrails?: None Help needed moving from lying on your back to sitting on the side of a flat bed without using bedrails?: A Little Help needed moving to and from a bed to a chair (including a wheelchair)?: A Little Help needed standing up from a chair using your arms (e.g., wheelchair or bedside chair)?: A Little Help needed to walk in hospital room?: A Little Help needed climbing 3-5 steps with a railing? : A Lot 6 Click Score: 18    End of Session   Activity Tolerance: Patient tolerated treatment well;Patient limited by fatigue Patient left: in chair;with call bell/phone within  reach Nurse Communication: Mobility status PT Visit Diagnosis: Muscle weakness (generalized) (M62.81);Other abnormalities of gait and mobility (R26.89)     Time: 8185-6314 PT Time Calculation (min) (ACUTE ONLY): 24 min  Charges:  $Gait Training: 8-22 mins $Therapeutic Exercise: 8-22 mins                     3:00 PM, 10/21/20 Lonell Grandchild, MPT Physical Therapist with [facility] [phone] office [phone] mobile phone

## 2020-10-21 NOTE — Consult Note (Signed)
@LOGO @   Referring Provider: [facility] hospitalist Primary Care Physician:  [facility] Primary Gastroenterologist:  H[place], [place]  Date of Admission: 10/14/2020 Date of Consultation: 10/21/2020  Reason for Consultation: Anemia, heme positive stool.  HPI:  Sabrina Wheeler is a 75 y.o. year old female  with medical history of hypertension, diabetes mellitus, hyperlipidemia, CKD presenting with 2 to 3-day history of shortness of breath and chest pain.  Noted chronic lower extremity edema and nonproductive cough, sleeping in recliner for 2 to 3 years.Upon EMS arrival, the patient was noted to have oxygen saturation 85% on room air and was placed on 2 L nasal cannula.  She was admitted with acute systolic and diastolic CHF, acute respiratory failure with hypoxia, acute on chronic CKD, elevated troponin, and also noted to have anemia.  During her hospital course, she was diuresed.  She developed respiratory distress requiring nitroglycerin drip and additional Lasix.  Nitroglycerin drip was DC'd on 8/12. Echo with EF 45%, ,+HK lateral wall; +AK inf and inferolateral; G2DD, PASP 45.5.  Overall, felt elevated troponin was secondary to demand ischemia.  She was initially treated with heparin, but this was DC'd after about 48 hours.  Cardiology was on board during admission.  Regarding her acute respiratory failure, she required up to 15 L HFNC, now on RA.  Empirically treated with ceftriaxone and azathioprine.   Regarding acute on chronic kidney disease, he was excepted that creatinine was going to worsen in the setting of IV diuresis.  Diuretics were held starting 8/13 with plans to restart on 8/17.  Regarding anemia, hemoglobin was 8.1 on admission, improved to 8.8 on 8/12, then slowly declined to 7.0 on 8/15.  No overt GI bleeding during hospitalization.  She received 1 unit PRBCs with hemoglobin up to 8.5 today vitamin B12 low normal at 339.  Ferritin 132, iron 16  (L), saturation 7% (L).  Fecal occult blood positive.  She received IV Feraheme and IM B12 x1.  Patient was requesting to go home yesterday and stated she would not have any GI procedures.  However, family is requesting further evaluation, thus GI consult was placed.  Today: Patient is a poor historian.  Denies BRBPR, melena, constipation, diarrhea, abdominal pain, nausea, vomiting, GERD, dysphagia.  Does not know if she is ever had a colonoscopy or EGD.  Denies history of NSAID use.  Does not know if she is on anything for acid reflux.  She is not sure if she would like to have EGD or colonoscopy.  Requested I talk with her family.  Spoke with patient's daughter, Sabrina Wheeler, patient's POA.  Reports patient lives at home with her husband.  There is a caregiver that comes in for 4 hours a day and her daughter and son-in-law take care of them on the weekends.  Patient is usually able to get up and take care of herself for the most part.  No known history of bright red blood per rectum or melena.  If she had a colonoscopy, states this was many years ago.  States she takes omeprazole twice daily, but cannot tell me be dose.  She also takes aspirin.  Thinks is 81 mg, but 325 mg as listed on medication list.  Daughter would like for patient to have EGD and colonoscopy.  She is on her way back to [place] now.  She was out of town.  Daughter reports patient has history of Alzheimer's and dementia.  Patient's husband also has some Alzheimer's.  Past Medical History:  Diagnosis Date   Broken ankle 2008   left    CHF (congestive heart failure) (HCC)    Diabetes mellitus without complication ([place])    Hypertension    Obesity    Renal disorder    Stage 4 kidney disease    Past Surgical History:  Procedure Laterality Date   BILE DUCT STENT PLACEMENT     INCISIONAL HERNIA REPAIR      Prior to Admission medications   Medication Sig Start Date End Date Taking? Authorizing Provider  amLODipine (NORVASC) 10  MG tablet Take 10 mg by mouth daily.   Yes [provider]  Ascorbic Acid (VITAMIN C CR) 1000 MG TBCR Take 500 mg by mouth daily. 06/13/20  Yes [provider]  atorvastatin (LIPITOR) 80 MG tablet Take 1 tablet by mouth daily. 09/25/18  Yes [provider]  Calcium Carb-Cholecalciferol 600-800 MG-UNIT TABS Take 1 tablet by mouth at bedtime.   Yes [provider]  carvedilol (COREG) 6.25 MG tablet Take 6.25 mg by mouth 2 (two) times daily with a meal.   Yes [provider]  CINNAMON PO Take 1 tablet by mouth daily.   Yes [provider]  donepezil (ARICEPT) 5 MG tablet Take 5 mg by mouth daily. 10/01/20  Yes [provider]  escitalopram (LEXAPRO) 10 MG tablet Take 10 mg by mouth daily.   Yes [provider]  lisinopril (ZESTRIL) 5 MG tablet Take 5 mg by mouth daily.   Yes [provider]  Multiple Vitamins-Minerals (MULTIVITAMIN WITH MINERALS) tablet Take 1 tablet by mouth daily.   Yes [provider]  sodium bicarbonate 650 MG tablet Take 650 mg by mouth 2 (two) times daily. 10/01/20  Yes [provider]  triamcinolone ointment (KENALOG) 0.1 % Apply 1 application topically 2 (two) times daily.   Yes [provider]  TURMERIC PO Take 1 tablet by mouth daily.   Yes [provider]  amoxicillin (AMOXIL) 500 MG capsule Take 1 capsule (500 mg total) by mouth 3 (three) times daily. Patient not taking: No sig reported 04/08/20   Triplett, Tammy, PA-C  aspirin 325 MG tablet Take 1 tablet (325 mg total) by mouth daily. 10/20/20   Orson Eva, MD  carvedilol (COREG) 12.5 MG tablet Take 1 tablet (12.5 mg total) by mouth 2 (two) times daily with a meal. 10/20/20   Tat, Shanon Brow, MD  cephALEXin (KEFLEX) 250 MG capsule Take 250 mg by mouth in the morning, at noon, and at bedtime. 09/29/20   [provider]  furosemide (LASIX) 40 MG tablet Take 1 tablet (40 mg total) by mouth daily. 10/22/20   Orson Eva, MD  hydrALAZINE (APRESOLINE) 25 MG tablet Take 1 tablet (25 mg total) by mouth every 8 (eight) hours. 10/20/20   Orson Eva, MD  minocycline (MINOCIN) 100 MG capsule Take 1 capsule (100 mg total) by mouth 2 (two) times daily. Patient not taking: No sig reported 04/08/20   Triplett, Tammy, PA-C  ondansetron (ZOFRAN) 4 MG tablet Take 1 tablet (4 mg total) by mouth every 6 (six) hours. As needed for nausea and vomiting Patient not taking: No sig reported 04/08/20   Triplett, Tammy, PA-C  vitamin B-12 (CYANOCOBALAMIN) 500 MCG tablet Take 1 tablet (500 mcg total) by mouth daily. 10/20/20   Orson Eva, MD    Current Facility-Administered Medications  Medication Dose Route Frequency Provider Last Rate Last Admin   0.9 %  sodium chloride infusion  250  mL Intravenous PRN Tat, Shanon Brow, MD       acetaminophen (TYLENOL) tablet 650 mg  650 mg Oral Q4H PRN Tat, David, MD       aspirin tablet 325 mg  325 mg Oral Daily Tat, David, MD   325 mg at 10/21/20 0810   atorvastatin (LIPITOR) tablet 80 mg  80 mg Oral Daily Tat, Shanon Brow, MD   80 mg at 10/21/20 0809   azithromycin (ZITHROMAX) tablet 500 mg  500 mg Oral Daily Tat, David, MD   500 mg at 10/21/20 0810   carvedilol (COREG) tablet 12.5 mg  12.5 mg Oral BID WC Tat, Shanon Brow, MD   12.5 mg at 10/21/20 0810   cefTRIAXone (ROCEPHIN) 1 g in sodium chloride 0.9 % 100 mL IVPB  1 g Intravenous Q24H Orson Eva, MD 200 mL/hr at 10/21/20 0813 1 g at 10/21/20 0813   Chlorhexidine Gluconate Cloth 2 % PADS 6 each  6 each Topical Daily Tat, David, MD   6 each at 10/21/20 0815   donepezil (ARICEPT) tablet 5 mg  5 mg Oral Benay Pike, MD   5 mg at 10/20/20 2138   [START ON 10/22/2020] furosemide (LASIX) tablet 40 mg  40 mg Oral Daily Tat, David, MD       hydrALAZINE (APRESOLINE) tablet 25 mg  25 mg Oral Franco Collet, MD   25 mg at 10/21/20 0506   nitroGLYCERIN (NITRODUR - Dosed in mg/24 hr) patch 0.4 mg  0.4 mg Transdermal Daily Tat, David, MD   0.4 mg at 10/21/20 0814    ondansetron (ZOFRAN) injection 4 mg  4 mg Intravenous Q6H PRN Tat, Shanon Brow, MD   4 mg at 10/17/20 0818   sodium chloride flush (NS) 0.9 % injection 3 mL  3 mL Intravenous Q12H Tat, Shanon Brow, MD   3 mL at 10/21/20 0815   sodium chloride flush (NS) 0.9 % injection 3 mL  3 mL Intravenous PRN Tat, Shanon Brow, MD       vitamin B-12 (CYANOCOBALAMIN) tablet 500 mcg  500 mcg Oral Daily Tat, David, MD   500 mcg at 10/21/20 0809    Allergies as of 10/14/2020 - Review Complete 10/14/2020  Allergen Reaction Noted   Macrobid [nitrofurantoin monohyd macro] Other (See Comments) 11/08/2013   Sulfamethoxazole-trimethoprim Other (See Comments) 01/10/2019   Doxycycline Nausea And Vomiting 11/08/2013   Clindamycin/lincomycin Rash 11/08/2013    Family History  Problem Relation Age of Onset   Cancer Mother    Heart disease Father    Heart attack Father     Social History   Socioeconomic History   Marital status: Married    Spouse name: Not on file   Number of children: Not on file   Years of education: Not on file   Highest education level: Not on file  Occupational History   Not on file  Tobacco Use   Smoking status: Never   Smokeless tobacco: Never  Vaping Use   Vaping Use: Never used  Substance and Sexual Activity   Alcohol use: No   Drug use: No   Sexual activity: Not Currently  Other Topics Concern   Not on file  Social History Narrative   Not on file   Social Determinants of Health   Financial Resource Strain: Not on file  Food Insecurity: Not on file  Transportation Needs: Not on file  Physical Activity: Not on file  Stress: Not on file  Social Connections: Not on file  Intimate Partner Violence: Not on  file    Review of Systems: Gen: Denies fever, chills, cold or flulike symptoms, presyncope, syncope. CV: Denies chest pain, heart palpitations Resp: Denies shortness of breath or cough. GI: See HPI GU : Denies urinary burning, urinary frequency, urinary incontinence.  MS: Denies  joint pain Derm: Denies rash Psych: Denies depression, anxiety Heme: Denies bruising, bleeding  Physical Exam: Vital signs in last 24 hours: Temp:  [97.5 F (36.4 C)-98.5 F (36.9 C)] 97.5 F (36.4 C) (08/16 0415) Pulse Rate:  [59-66] 66 (08/16 0810) Resp:  [17-18] 18 (08/16 0415) BP: (131-152)/(46-58) 152/55 (08/16 0810) SpO2:  [94 %-99 %] 96 % (08/16 0415) Weight:  [77.5 kg] 77.5 kg (08/16 0415) Last BM Date: 10/18/20 General:   Alert,  Well-developed, well-nourished, pleasant and cooperative in NAD Head:  Normocephalic and atraumatic. Eyes:  Sclera clear, no icterus.   Conjunctiva pink. Ears:  Normal auditory acuity. Lungs:  Clear throughout to auscultation.   No wheezes, crackles, or rhonchi. No acute distress. Heart:  Regular rate and rhythm; no murmurs, clicks, rubs,  or gallops. Abdomen:  Soft, nontender and nondistended. No masses, hepatosplenomegaly or hernias noted. Normal bowel sounds, without guarding, and without rebound.   Rectal:  Deferred  Msk:  Symmetrical without gross deformities. Normal posture. Extremities:  Without edema. Neurologic:  Alert and  oriented to self.  Knows she is at hospital, but cannot tell me why she is here.  Unable to tell me the year, location, or president. Skin:  Intact without significant lesions or rashes. Psych: Normal mood and affect.  Intake/Output from previous day: 08/15 0701 - 08/16 0700 In: 555 [P.O.:240; Blood:315] Out: 100 [Urine:100] Intake/Output this shift: Total I/O In: -  Out: 300 [Urine:300]  Lab Results: Recent Labs    10/19/20 0330 10/20/20 0556 10/21/20 0400  WBC 8.3 7.3 7.8  HGB 7.4* 7.0* 8.5*  HCT 24.1* 23.1* 26.7*  PLT 259 245 283   BMET Recent Labs    10/19/20 0330 10/20/20 0556  NA 138 140  K 4.3 4.4  CL 109 108  CO2 23 22  GLUCOSE 107* 102*  BUN 73* 78*  CREATININE 3.75* 3.61*  CALCIUM 7.8* 8.1*    Impression: 75 y.o. year old female  with medical history of hypertension, diabetes  mellitus, hyperlipidemia, CKD presenting with 2 to 3-day history of shortness of breath and chest pain.  Noted chronic lower extremity edema and nonproductive cough, sleeping in recliner for 2 to 3 years.Upon EMS arrival, the patient was noted to have oxygen saturation 85% on room air and was placed on 2 L nasal cannula.  She was admitted with acute systolic and diastolic CHF, acute respiratory failure with hypoxia, acute on chronic CKD, elevated troponin, and also noted to have anemia. GI consulted due to anemia.   Anemia: Chronic anemia in the setting of CKD with baseline hemoglobin in the 9-10 range.  Hemoglobin 8.1 on admission,  improved to 8.8 on 8/12, then slowly declined to 7.0 on 8/15.  No overt GI bleeding during hospitalization and denies any significant upper or lower GI symptoms.  Per patient's daughter, she is on omeprazole twice daily chronically due to history of reflux.  Takes aspirin ?81 vs 325mg  daily.  No other NSAIDs.  Unknown if she has had a prior colonoscopy.  EGD April 2021 was normal.  She received 1 unit PRBCs with hemoglobin up to 8.5 today. vitamin B12 low normal at 339.  Ferritin 132, iron 16 (L), saturation 7% (L).  Fecal occult blood  positive. She received IV Feraheme and IM B12 x1, now on oral supplementation.    With IDA, would recommend EGD and colonoscopy for further evaluation. Suspect occult, slow bleed that could be coming from essentially anywhere in the GI tract with differentials including gastritis, esophagitis, duodenitis, PUD, AVMs, colon polyps, and malignancy.  Patient was initially requesting to go home yesterday and declined GI procedure; however, family has requested further evaluation.  Notably, patient has history of Alzheimer's/dementia.  Upon further discussion today, patient states she would like me to talk with her family to determine if she should pursue EGD and colonoscopy.  Spoke with patient's daughter, Sabrina Wheeler, [place], who would like for EGD and colonoscopy  to be completed inpatient.  Will discuss further with Dr. Jenetta Downer.   Acute on chronic kidney disease: Creatinine 2.84 on admission, increased to 3.75 on 8/14 in the setting of IV diuresis.  Diuretics were held starting 8/13 with plans to resume on 8/17.  Creatinine slowly improving.  Creatinine 3.61 yesterday.  BMP pending today.   Regarding acute systolic and diastolic CHF, acute respiratory failure with hypoxia, and elevated troponins, patient is clinically improved.  She has been diuresed.  Echo with EF 45%. Felt elevated troponin was secondary to demand ischemia.  She was initially treated with heparin, but this was DC'd after about 48 hours.  Cardiology was on board during admission.  Regarding her acute respiratory failure, she required up to 15 L HFNC, now on RA.  Empirically treated with ceftriaxone and azathioprine.   Plan: Patient will benefit from EGD and colonoscopy for further evaluation of IDA.  Timing to be determined.  Will discuss with Dr. Jenetta Downer. Clear liquids today as we may need to prep for procedures tomorrow. Start PPI twice daily. Management of AKI per hospitalist.    LOS: 5 days    10/21/2020, 8:18 AM   Aliene Altes, PA-C [facility]

## 2020-10-21 NOTE — Progress Notes (Signed)
PROGRESS NOTE  Sabrina Wheeler QJF:[id number] DOB: 1946/02/04 DOA: 10/14/2020 PCP: [facility]  Brief History:  75 y.o. female with medical history of hypertension, diabetes mellitus, hyperlipidemia, CKD presenting with 2 to 3-day history of shortness of breath and chest pain.  The patient is a difficult historian at best.  She states that she has been having substernal chest pain and shortness of breath for the past 2 to 3 days.  She states that her chest pain occurs even at rest.  She states that she has chronic lower extremity edema but thinks that it is about the same as usual.  She has a nonproductive cough.  She denies any fevers, chills, headache, neck pain, nausea, vomiting or diarrhea.  She has not had any hemoptysis, hematochezia, melena.  The patient states that she normally sleeps in a recliner and has been doing so for the last 2 to 3 years.  She states that she has not taken any prescription medication for the past 3 years.  Apparently, the patient's primary care provider, the patient has not reestablished with any provider since then.  Upon EMS arrival, the patient was noted to have oxygen saturation 85% on room air. In the emergency department, the patient was afebrile and hemodynamically stable with oxygen saturation 90% on room air.  She was placed on 2 L with oxygen saturation up to 100%.  BMP shows sodium 138, potassium 4.8, serum creatinine 2.84.  WBC 13.0, hemoglobin 8.1, platelets 275,000.  Troponin 36>>102.  Chest x-ray showed bilateral patchy opacities.  She was started on IV heparin.  She had about 72 hours of IV heparin and remained stable after discontinuation.   Assessment/Plan:  Acute systolic and diastolic CHF -initially on IV furosemide 60 mg BID -8/10 PM--developed resp distress requiring NTG drip and additional lasix 80 mg  -8/11 AM--resp distress again requiring NTG drip -Daily weights -Accurate I's and O's -8/10 Echo--EF  45%,+HK lateral wall; +AK inf and inferolateral; G2DD, PASP 45.5 -d/c nitroglycerin drip 8/12 -hold diuretic 8/14 and 8/15 due to uptrending serum creatinine>>serum creatinine beginning to improve -restart lasix 40 mg po daily on 10/22/20 if creatinine continues to improve -will need repeat BMP within one week on discharge--discussed with patient and daughter   Chest pain/elevated troponin -Difficult to ascertain whether it is atypical or typical -Troponins elevated secondary to demand ischemia from CHF -Cardiology consult appreciated -d/c IV heparin--she had >48 hours during this hospitalization -continue Aspirin  Acute Anemia/FOBT+ -Hgb steadily trending down since admissino -GI consulted>>plans EGD/colonoscopy 8/17 -one unit PRBC given 10/20/20   Acute Respiratory failure with hypoxia -due to pulmonary edema -85% on RA initially -required up to 15L HFNC in PM 8/10 -now back to RA -continue empiric ceftriaxone and azithro due to increasing PCT -finished 5 days ceftriaxone and azithro during hospitalization   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 -Baseline creatinine 1.9-2.2 -Presented with serum creatinine 2.84 -Suspect patient may have progression of her underlying CKD -will need to tolerated worsen renal function for euvolemia and improved oxygenation -continue to hold diuretic due to uptrending serum creatinine -serum creatinine pleateaued at 3.75 -restart lasix 40 mg po daily on 10/22/20 if creatinine continues to improve   Essential hypertension -Had not taken any antihypertensive medications for 3 years -Previously took amlodipine, carvedilol, lisinopril -Monitor with diuresis -d/c lisinopril due to CKD -restarted coreg -d/c amlodipine due to decrease EF   Hyperlipidemia -LDL 43 -Previously took Lipitor--restart  Diabetes mellitus type 2 -Was not previously taking any agents -Check hemoglobin A1c--5.9   Left pretibial wound -Wound care consult appreciated -not  infected   Anemia of chronic disease -B12--239>>supplement -iron saturation 7% -ferritin 132 -supplement ferrtin IV and B12 IM x 1 -check FOBT--POSITIVE -d/c home with ferrous sulfate and B12 supplement        Status is: Inpatient  Remains inpatient appropriate because:Inpatient level of care appropriate due to severity of illness  Dispo: The patient is from: Home              Anticipated d/c is to: Home              Patient currently is not medically stable to d/c.   Difficult to place patient No        Family Communication:   Daughter updated at bedside 8/16  Consultants:  cardiology, GI  Code Status:  FULL   DVT Prophylaxis:  SCDs   Procedures: As Listed in Progress Note Above  Antibiotics: None        Subjective:  Patient denies fevers, chills, headache, chest pain, dyspnea, nausea, vomiting, diarrhea, abdominal pain, dysuria, hematuria, hematochezia, and melena.  Objective: Vitals:   10/21/20 1155 10/21/20 1353 10/21/20 1400 10/21/20 1704  BP: (!) 139/51 (!) 155/54  (!) 142/51  Pulse: (!) 58   67  Resp: 16     Temp: (!) 97.5 F (36.4 C)     TempSrc: Oral     SpO2: 98%  97%   Weight:      Height:        Intake/Output Summary (Last 24 hours) at 10/21/2020 1735 Last data filed at 10/21/2020 1256 Gross per 24 hour  Intake 720 ml  Output 400 ml  Net 320 ml   Weight change: 2 kg Exam:  General:  Pt is alert, follows commands appropriately, not in acute distress HEENT: No icterus, No thrush, No neck mass, [place]/AT Cardiovascular: RRR, S1/S2, no rubs, no gallops Respiratory: fine bibasilar crackles. No wheeze Abdomen: Soft/+BS, non tender, non distended, no guarding Extremities: trace LE edema, No lymphangitis, No petechiae, No rashes, no synovitis   Data Reviewed: I have personally reviewed following labs and imaging studies Basic Metabolic Panel: Recent Labs  Lab 10/17/20 0404 10/18/20 0344 10/19/20 0330 10/20/20 0556  10/21/20 0400  NA 140 138 138 140 137  K 4.2 4.2 4.3 4.4 3.8  CL 107 110 109 108 108  CO2 22 22 23 22  20*  GLUCOSE 149* 116* 107* 102* 107*  BUN 52* 63* 73* 78* 70*  CREATININE 2.91* 3.33* 3.75* 3.61* 3.28*  CALCIUM 8.6* 8.3* 7.8* 8.1* 8.3*  MG 1.6*  --   --   --   --    Liver Function Tests: No results for input(s): AST, ALT, ALKPHOS, BILITOT, PROT, ALBUMIN in the last 168 hours. No results for input(s): LIPASE, AMYLASE in the last 168 hours. No results for input(s): AMMONIA in the last 168 hours. Coagulation Profile: No results for input(s): INR, PROTIME in the last 168 hours. CBC: Recent Labs  Lab 10/15/20 0018 10/16/20 0344 10/17/20 0404 10/18/20 0344 10/19/20 0330 10/20/20 0556 10/21/20 0400  WBC 13.0*   < > 14.9* 10.8* 8.3 7.3 7.8  NEUTROABS 10.8*  --   --   --   --   --   --   HGB 8.1*   < > 8.8* 7.8* 7.4* 7.0* 8.5*  HCT 25.3*   < > 27.7* 25.2* 24.1* 23.1* 26.7*  MCV 93.7   < > 92.0 94.0 93.4 96.3 92.4  PLT 275   < > 310 249 259 245 283   < > = values in this interval not displayed.   Cardiac Enzymes: No results for input(s): CKTOTAL, CKMB, CKMBINDEX, TROPONINI in the last 168 hours. BNP: Invalid input(s): POCBNP CBG: No results for input(s): GLUCAP in the last 168 hours. HbA1C: No results for input(s): HGBA1C in the last 72 hours. Urine analysis:    Component Value Date/Time   COLORURINE STRAW (A) 10/16/2020 1439   APPEARANCEUR HAZY (A) 10/16/2020 1439   LABSPEC 1.006 10/16/2020 1439   PHURINE 5.0 10/16/2020 1439   GLUCOSEU 50 (A) 10/16/2020 1439   HGBUR NEGATIVE 10/16/2020 1439   BILIRUBINUR NEGATIVE 10/16/2020 1439   KETONESUR NEGATIVE 10/16/2020 1439   PROTEINUR 100 (A) 10/16/2020 1439   NITRITE NEGATIVE 10/16/2020 1439   LEUKOCYTESUR NEGATIVE 10/16/2020 1439   Sepsis Labs: @LABRCNTIP (procalcitonin:4,lacticidven:4) ) Recent Results (from the past 240 hour(s))  Resp Panel by RT-PCR (Flu A&B, Covid) Nasopharyngeal Swab     Status: None    Collection Time: 10/15/20  3:40 AM   Specimen: Nasopharyngeal Swab; Nasopharyngeal(NP) swabs in vial transport medium  Result Value Ref Range Status   SARS Coronavirus 2 by RT PCR NEGATIVE NEGATIVE Final    Comment: (NOTE) SARS-CoV-2 target nucleic acids are NOT DETECTED.  The SARS-CoV-2 RNA is generally detectable in upper respiratory specimens during the acute phase of infection. The lowest concentration of SARS-CoV-2 viral copies this assay can detect is 138 copies/mL. A negative result does not preclude SARS-Cov-2 infection and should not be used as the sole basis for treatment or other patient management decisions. A negative result may occur with  improper specimen collection/handling, submission of specimen other than nasopharyngeal swab, presence of viral mutation(s) within the areas targeted by this assay, and inadequate number of viral copies(<138 copies/mL). A negative result must be combined with clinical observations, patient history, and epidemiological information. The expected result is Negative.  Fact Sheet for Patients:  EntrepreneurPulse.com.au  Fact Sheet for Healthcare Providers:  IncredibleEmployment.be  This test is no t yet approved or cleared by the [place] FDA and  has been authorized for detection and/or diagnosis of SARS-CoV-2 by FDA under an Emergency Use Authorization (EUA). This EUA will remain  in effect (meaning this test can be used) for the duration of the COVID-19 declaration under Section 564(b)(1) of the Act, 21 U.S.C.section 360bbb-3(b)(1), unless the authorization is terminated  or revoked sooner.       Influenza A by PCR NEGATIVE NEGATIVE Final   Influenza B by PCR NEGATIVE NEGATIVE Final    Comment: (NOTE) The Xpert Xpress SARS-CoV-2/FLU/RSV plus assay is intended as an aid in the diagnosis of influenza from Nasopharyngeal swab specimens and should not be used as a sole basis for treatment.  Nasal washings and aspirates are unacceptable for Xpert Xpress SARS-CoV-2/FLU/RSV testing.  Fact Sheet for Patients: EntrepreneurPulse.com.au  Fact Sheet for Healthcare Providers: IncredibleEmployment.be  This test is not yet approved or cleared by the [place] FDA and has been authorized for detection and/or diagnosis of SARS-CoV-2 by FDA under an Emergency Use Authorization (EUA). This EUA will remain in effect (meaning this test can be used) for the duration of the COVID-19 declaration under Section 564(b)(1) of the Act, 21 U.S.C. section 360bbb-3(b)(1), unless the authorization is terminated or revoked.  Performed at [facility], [address] [place], [place] [address]   MRSA Next Gen by PCR, Nasal  Status: None   Collection Time: 10/15/20  6:34 PM   Specimen: Nasal Mucosa; Nasal Swab  Result Value Ref Range Status   MRSA by PCR Next Gen NOT DETECTED NOT DETECTED Final    Comment: (NOTE) The GeneXpert MRSA Assay (FDA approved for NASAL specimens only), is one component of a comprehensive MRSA colonization surveillance program. It is not intended to diagnose MRSA infection nor to guide or monitor treatment for MRSA infections. Test performance is not FDA approved in patients less than 14 years old. Performed at [facility], [address] [place], [place] [address]   Culture, blood (Routine X 2) w Reflex to ID Panel     Status: None   Collection Time: 10/16/20  8:43 AM   Specimen: Right Antecubital; Blood  Result Value Ref Range Status   Specimen Description RIGHT ANTECUBITAL  Final   Special Requests   Final    BOTTLES DRAWN AEROBIC AND ANAEROBIC Blood Culture adequate volume   Culture   Final    NO GROWTH 5 DAYS Performed at [facility], [address], [place], [place] [address]    Report Status 10/21/2020 FINAL  Final  Culture, blood (Routine X 2) w Reflex to ID Panel     Status: None   Collection Time: 10/16/20  8:44  AM   Specimen: BLOOD RIGHT HAND  Result Value Ref Range Status   Specimen Description BLOOD RIGHT HAND  Final   Special Requests   Final    BOTTLES DRAWN AEROBIC AND ANAEROBIC Blood Culture adequate volume   Culture   Final    NO GROWTH 5 DAYS Performed at [facility], [address], [place], [place] [address]    Report Status 10/21/2020 FINAL  Final  Urine Culture     Status: Abnormal   Collection Time: 10/16/20  2:39 PM   Specimen: Urine, Clean Catch  Result Value Ref Range Status   Specimen Description   Final    URINE, CLEAN CATCH Performed at [facility], [address], [place], [place] [address]    Special Requests   Final    NONE Performed at [facility], [address] [place], [place] [address]    Culture MULTIPLE SPECIES PRESENT, SUGGEST RECOLLECTION (A)  Final   Report Status 10/18/2020 FINAL  Final     Scheduled Meds:  aspirin  325 mg Oral Daily   atorvastatin  80 mg Oral Daily   carvedilol  12.5 mg Oral BID WC   Chlorhexidine Gluconate Cloth  6 each Topical Daily   donepezil  5 mg Oral QHS   [START ON 10/22/2020] furosemide  40 mg Oral Daily   hydrALAZINE  25 mg Oral Q8H   nitroGLYCERIN  0.4 mg Transdermal Daily   pantoprazole  40 mg Oral BID   sodium chloride flush  3 mL Intravenous Q12H   vitamin B-12  500 mcg Oral Daily   Continuous Infusions:  sodium chloride      Procedures/Studies: DG Chest 2 View  Result Date: 10/15/2020 CLINICAL DATA:  Chest pain and shortness of breath EXAM: CHEST - 2 VIEW COMPARISON:  11/30/2018 FINDINGS: Cardiac shadow is enlarged but stable. Vascular congestion is noted with patchy airspace disease likely representing parenchymal edema. Small posterior effusions are noted. No pneumothorax is noted. No bony abnormality is seen. IMPRESSION: Changes consistent with CHF with patchy parenchymal edema. Electronically Signed   By: Inez Catalina M.D.   On: 10/15/2020 00:50   DG CHEST PORT 1 VIEW  Result Date:  10/15/2020 CLINICAL  DATA:  Intermittent chest pain and shortness of breath EXAM: PORTABLE CHEST 1 VIEW COMPARISON:  10/15/2020 12:20 a.m. FINDINGS: Unchanged enlarged cardiac silhouette. Redemonstrated vascular congestion and patchy airspace opacities. Trace pleural effusions. No pneumothorax. No acute osseous abnormality. IMPRESSION: Overall unchanged patchy parenchymal opacities, concerning for edema. Electronically Signed   By: Merilyn Baba MD   On: 10/15/2020 16:07   ECHOCARDIOGRAM COMPLETE  Result Date: 10/15/2020    ECHOCARDIOGRAM REPORT   Patient Name:   Sabrina Wheeler Date of Exam: 10/15/2020 Medical Rec #:  [id number]        Height:       62.0 in Accession #:    [id number]       Weight:       180.8 lb Date of Birth:  Sep 25, 1945        BSA:          1.831 m Patient Age:    30 years         BP:           149/61 mmHg Patient Gender: F                HR:           62 bpm. Exam Location:  [facility] Procedure: 2D Echo, Cardiac Doppler and Color Doppler Indications:    CHF-Acute Diastolic  History:        Patient has prior history of Echocardiogram examinations, most                 recent 01/14/2016. CHF, CAD, Arrythmias:LBBB,                 Signs/Symptoms:Shortness of Breath; Risk Factors:Hypertension                 and Dyslipidemia.  Sonographer:    Wenda Low Referring Phys: [phone] Chrishonda Hesch IMPRESSIONS  1. Compared to echo report from 2017, LVEF is down and wall motion changes are new.  2. There is hypokinesis of the lateral wall, distal anterior and akinesis of the distal inferior, distal inferolateral, distal inferoseptal and apical walls . Left ventricular ejection fraction, by estimation, is 45%. The left ventricular internal cavity size was mildly dilated. There is mild left ventricular hypertrophy. Left ventricular diastolic parameters are consistent with Grade II diastolic dysfunction (pseudonormalization).  3. Right ventricular systolic function is low normal. The right ventricular size is normal. There is  moderately elevated pulmonary artery systolic pressure.  4. Left atrial size was moderately dilated.  5. Mild mitral valve regurgitation.  6. The aortic valve is tricuspid. Aortic valve regurgitation is not visualized. Mild to moderate aortic valve sclerosis/calcification is present, without any evidence of aortic stenosis.  7. The inferior vena cava is dilated in size with >50% respiratory variability, suggesting right atrial pressure of 8 mmHg. FINDINGS  Left Ventricle: There is hypokinesis of the lateral wall, distal anterior and akinesis of the distal inferior, distal inferolateral, distal inferoseptal and apical walls. Left ventricular ejection fraction, by estimation, is 45%%. The left ventricle has  mildly decreased function. The left ventricular internal cavity size was mildly dilated. There is mild left ventricular hypertrophy. Left ventricular diastolic parameters are consistent with Grade II diastolic dysfunction (pseudonormalization). Right Ventricle: The right ventricular size is normal. Right vetricular wall thickness was not assessed. Right ventricular systolic function is low normal. There is moderately elevated pulmonary artery systolic pressure. The tricuspid regurgitant velocity is 3.06 m/s, and with an assumed right  atrial pressure of 8 mmHg, the estimated right ventricular systolic pressure is 13.0 mmHg. Left Atrium: Left atrial size was moderately dilated. Right Atrium: Right atrial size was normal in size. Pericardium: Trivial pericardial effusion is present. Mitral Valve: There is mild thickening of the mitral valve leaflet(s). There is mild calcification of the mitral valve leaflet(s). Mild to moderate mitral annular calcification. Mild mitral valve regurgitation. MV peak gradient, 13.4 mmHg. The mean mitral valve gradient is 5.0 mmHg. Tricuspid Valve: The tricuspid valve is normal in structure. Tricuspid valve regurgitation is mild. Aortic Valve: The aortic valve is tricuspid. Aortic valve  regurgitation is not visualized. Mild to moderate aortic valve sclerosis/calcification is present, without any evidence of aortic stenosis. Aortic valve mean gradient measures 7.0 mmHg. Aortic valve peak gradient measures 12.8 mmHg. Aortic valve area, by VTI measures 1.81 cm. Pulmonic Valve: The pulmonic valve was normal in structure. Pulmonic valve regurgitation is trivial. Aorta: The aortic root and ascending aorta are structurally normal, with no evidence of dilitation. Venous: The inferior vena cava is dilated in size with greater than 50% respiratory variability, suggesting right atrial pressure of 8 mmHg. IAS/Shunts: No atrial level shunt detected by color flow Doppler.  LEFT VENTRICLE PLAX 2D LVIDd:         5.28 cm      Diastology LVIDs:         4.00 cm      LV e' medial:    8.76 cm/s LV PW:         1.26 cm      LV E/e' medial:  18.7 LV IVS:        1.11 cm      LV e' lateral:   10.20 cm/s LVOT diam:     2.00 cm      LV E/e' lateral: 16.1 LV SV:         84 LV SV Index:   46 LVOT Area:     3.14 cm  LV Volumes (MOD) LV vol d, MOD A2C: 136.0 ml LV vol d, MOD A4C: 114.0 ml LV vol s, MOD A2C: 69.1 ml LV vol s, MOD A4C: 67.1 ml LV SV MOD A2C:     66.9 ml LV SV MOD A4C:     114.0 ml LV SV MOD BP:      61.6 ml RIGHT VENTRICLE RV Basal diam:  3.24 cm RV Mid diam:    2.50 cm RV S prime:     15.30 cm/s TAPSE (M-mode): 2.6 cm LEFT ATRIUM             Index       RIGHT ATRIUM           Index LA diam:        4.80 cm 2.62 cm/m  RA Area:     15.90 cm LA Vol (A2C):   93.6 ml 51.11 ml/m RA Volume:   40.90 ml  22.33 ml/m LA Vol (A4C):   88.3 ml 48.22 ml/m LA Biplane Vol: 96.4 ml 52.64 ml/m  AORTIC VALVE AV Area (Vmax):    1.90 cm AV Area (Vmean):   1.78 cm AV Area (VTI):     1.81 cm AV Vmax:           179.00 cm/s AV Vmean:          124.000 cm/s AV VTI:            0.463 m AV Peak Grad:      12.8 mmHg AV  Mean Grad:      7.0 mmHg LVOT Vmax:         108.00 cm/s LVOT Vmean:        70.300 cm/s LVOT VTI:          0.267 m  LVOT/AV VTI ratio: 0.58  AORTA Ao Root diam: 3.00 cm Ao Asc diam:  3.00 cm MITRAL VALVE                TRICUSPID VALVE MV Area (PHT): 3.91 cm     TR Peak grad:   37.5 mmHg MV Area VTI:   1.94 cm     TR Vmax:        306.00 cm/s MV Peak grad:  13.4 mmHg MV Mean grad:  5.0 mmHg     SHUNTS MV Vmax:       1.83 m/s     Systemic VTI:  0.27 m MV Vmean:      104.0 cm/s   Systemic Diam: 2.00 cm MV Decel Time: 194 msec MV E velocity: 164.00 cm/s MV A velocity: 102.00 cm/s MV E/A ratio:  1.61 Dorris Carnes MD Electronically signed by Dorris Carnes MD Signature Date/Time: 10/15/2020/9:18:51 PM    Final     Orson Eva, DO  [facility]  If 7PM-7AM, please contact night-coverage [email or web address] Password [email or web address] 10/21/2020, 5:35 PM   LOS: 5 days

## 2020-10-22 DIAGNOSIS — N189 Chronic kidney disease, unspecified: Secondary | ICD-10-CM

## 2020-10-22 LAB — BLOOD GAS, VENOUS
Acid-base deficit: 4.4 mmol/L — ABNORMAL HIGH (ref 0.0–2.0)
Bicarbonate: 20.3 mmol/L (ref 20.0–28.0)
FIO2: 21
O2 Saturation: 56.1 %
Patient temperature: 36.6
pCO2, Ven: 34 mmHg — ABNORMAL LOW (ref 44.0–60.0)
pH, Ven: 7.383 (ref 7.250–7.430)
pO2, Ven: 31.6 mmHg — CL (ref 32.0–45.0)

## 2020-10-22 LAB — CBC
HCT: 27.2 % — ABNORMAL LOW (ref 36.0–46.0)
Hemoglobin: 8.5 g/dL — ABNORMAL LOW (ref 12.0–15.0)
MCH: 29.1 pg (ref 26.0–34.0)
MCHC: 31.3 g/dL (ref 30.0–36.0)
MCV: 93.2 fL (ref 80.0–100.0)
Platelets: 307 10*3/uL (ref 150–400)
RBC: 2.92 MIL/uL — ABNORMAL LOW (ref 3.87–5.11)
RDW: 13.5 % (ref 11.5–15.5)
WBC: 8.9 10*3/uL (ref 4.0–10.5)
nRBC: 0 % (ref 0.0–0.2)

## 2020-10-22 LAB — BASIC METABOLIC PANEL
Anion gap: 6 (ref 5–15)
BUN: 63 mg/dL — ABNORMAL HIGH (ref 8–23)
CO2: 23 mmol/L (ref 22–32)
Calcium: 8.3 mg/dL — ABNORMAL LOW (ref 8.9–10.3)
Chloride: 108 mmol/L (ref 98–111)
Creatinine, Ser: 3.06 mg/dL — ABNORMAL HIGH (ref 0.44–1.00)
GFR, Estimated: 15 mL/min — ABNORMAL LOW (ref >60)
Glucose, Bld: 108 mg/dL — ABNORMAL HIGH (ref 70–99)
Potassium: 3.8 mmol/L (ref 3.5–5.1)
Sodium: 137 mmol/L (ref 135–145)

## 2020-10-22 LAB — HEMOGLOBIN AND HEMATOCRIT, BLOOD
HCT: 29.4 % — ABNORMAL LOW (ref 36.0–46.0)
Hemoglobin: 9.4 g/dL — ABNORMAL LOW (ref 12.0–15.0)

## 2020-10-22 MED ORDER — SODIUM CHLORIDE 0.9 % IV SOLN: INTRAVENOUS | Status: DC

## 2020-10-22 MED ORDER — PEG 3350-KCL-NA BICARB-NACL 420 G PO SOLR
4000.0000 mL | Freq: Once | ORAL | Status: AC
  Administered 2020-10-22: 4000 mL via ORAL

## 2020-10-22 MED ORDER — LORAZEPAM 2 MG/ML IJ SOLN
1.0000 mg | Freq: Once | INTRAMUSCULAR | Status: AC
  Administered 2020-10-22: 1 mg via INTRAVENOUS
  Filled 2020-10-22: qty 1

## 2020-10-22 NOTE — Progress Notes (Signed)
Subjective: Patient states she is feeling okay this morning. Denies abdominal pain and does not feel like abdomen is distended. Denies nausea, vomiting. She reports 3 BMs today, states that they were a little loose but denies melena or hematochezia. Tolerating clear liquids well.  Objective: Vital signs in last 24 hours: Temp:  [97.5 F (36.4 C)-98 F (36.7 C)] 98 F (36.7 C) (08/17 0534) Pulse Rate:  [57-67] 65 (08/17 0534) Resp:  [16-19] 19 (08/17 0534) BP: (139-155)/(48-61) 153/61 (08/17 0534) SpO2:  [96 %-98 %] 97 % (08/17 0534) Weight:  [77 kg] 77 kg (08/17 0535) Last BM Date: 10/18/20 General:   Alert and oriented, pleasant Head:  Normocephalic and atraumatic. Eyes:  No icterus, sclera clear. Conjuctiva pink.  Mouth:  Without lesions, mucosa pink and moist.  Neck:  Supple, without thyromegaly or masses.  Heart:  S1, S2 present, no murmurs noted.  Lungs: Clear to auscultation bilaterally, without wheezing, rales, or rhonchi.  Abdomen:  Bowel sounds present, soft, non-tender, non-distended. No HSM or hernias noted. No rebound or guarding. No masses appreciated  Msk:  Symmetrical without gross deformities. Normal posture. Pulses:  Normal pulses noted. Extremities:  Without clubbing or edema. Neurologic:  Alert and  oriented x4;  grossly normal neurologically. Skin:  Warm and dry, intact without significant lesions.  Psych:  Alert and cooperative. Normal mood and affect.  Intake/Output from previous day: 08/16 0701 - 08/17 0700 In: 720 [P.O.:720] Out: 301 [Urine:301] Intake/Output this shift: Total I/O In: 236 [P.O.:236] Out: 300 [Urine:300]  Lab Results: Recent Labs    10/20/20 0556 10/21/20 0400 10/22/20 0343  WBC 7.3 7.8 8.9  HGB 7.0* 8.5* 8.5*  HCT 23.1* 26.7* 27.2*  PLT 245 283 307   BMET Recent Labs    10/20/20 0556 10/21/20 0400 10/22/20 0343  NA 140 137 137  K 4.4 3.8 3.8  CL 108 108 108  CO2 22 20* 23  GLUCOSE 102* 107* 108*  BUN 78* 70* 63*   CREATININE 3.61* 3.28* 3.06*  CALCIUM 8.1* 8.3* 8.3*   Assessment: 75 year old female with pmh of HTN, DM, HLD, CKD, and dementia presenting with 2 to 3 day history of sob and chest pain, chronic lower extremity edema and non productive cough who presented to the ED on 8/9 with low O2 sat in the mid 80s. Patient admitted for heart failure exacerbation and found to have worsening EF of 45%, which was evaluated by Cardiology for management of diuresis. Patient was also found to have anemia for which GI was consulted.  Anemia:  Acute on chronic anemia, baseline hemoglobin 9-10 in the setting of CKD. iron saturation of 7% and low total iron of 16, ferritin 132, Vitamin B12 low normal at 339.Initial hgb was 8.1 but gradually declined to 7, she was transfused 1 unit of PRBC with improvement of hgb to 8.5 currently. Patient unsure if she has ever had previous colonoscopy, however EGD April 2021 was normal. FOBT positive. She received IV Feraheme and IM B12 x1, now on oral supplementation. She denies any overt GI bleeding or upper GI symptoms. Takes omeprazole BID for chronic GERD. patient would benefit from both upper and lower GI evaluations while inpatient to determine if source of anemia is of GI etiology. Cannot rule out gastritis, duodenitis, PUD, AVMs, colon polyps or malignancy.   Creatnine initially was 2.84 on admission, increaed to 3.75 in setting of IV diuresis, diuretics held starting 8/13 with plans to resume today. Creatnine 3.06 currently  Acute systolic  and diastolic CHF, being followed by cards, ECHO with EF of 45% and elevated troponins that were felt to be likely related to demand ischemia. Initially treated with heparin that was stopped 3 days ago. We will plan to proceed with GI procedures tomorrow as cardiology has cleared patient from their standpoint.  Indications, risks and benefits of procedure discussed in detail with patient. Patient verbalized understanding and is in agreement to  proceed with EGD/Colonoscopy at this time.   Plan: Plan for EGD/Colonoscopy tomorrow Clear liquids in prep for procedures tomorrow NPO at midnight for procedures tomorrow Continue PPI BID AKI management per hospitalist Continue to trend H&H    LOS: 6 days    10/22/2020, 8:31 AM  Beau Ramsburg L. Alver Sorrow, MSN, APRN, AGNP-C Adult-Gerontology Nurse Practitioner [facility]

## 2020-10-22 NOTE — Progress Notes (Signed)
Pt is cont to get up from recliner chair in search of her husband even after being redirected several times. Pt is tearful that she is wearing a hospital gown and wants to go home. Pt is reorient of her surrounding and assisted back to the recliner. MD made aware.

## 2020-10-22 NOTE — Final Progress Note (Signed)
Progress Note  Patient Name: Sabrina Wheeler Date of Encounter: 10/22/2020  [facility] Cardiologist: Carlyle Dolly, MD   Subjective  Says she feels okay.  No chest discomfort or dyspnea.  Has not eaten today in preparation for possible procedure.  Inpatient Medications    Scheduled Meds:  aspirin  325 mg Oral Daily   atorvastatin  80 mg Oral Daily   carvedilol  12.5 mg Oral BID WC   Chlorhexidine Gluconate Cloth  6 each Topical Daily   donepezil  5 mg Oral QHS   furosemide  40 mg Oral Daily   hydrALAZINE  25 mg Oral Q8H   nitroGLYCERIN  0.4 mg Transdermal Daily   pantoprazole  40 mg Oral BID   sodium chloride flush  3 mL Intravenous Q12H   vitamin B-12  500 mcg Oral Daily   Continuous Infusions:  sodium chloride     PRN Meds: sodium chloride, acetaminophen, ondansetron (ZOFRAN) IV, sodium chloride flush   Vital Signs    Vitals:   10/21/20 1704 10/21/20 2139 10/22/20 0534 10/22/20 0535  BP: (!) 142/51 (!) 140/48 (!) 153/61   Pulse: 67 (!) 57 65   Resp:  18 19   Temp:  98 F (36.7 C) 98 F (36.7 C)   TempSrc:  Oral Oral   SpO2:  96% 97%   Weight:    77 kg  Height:        Intake/Output Summary (Last 24 hours) at 10/22/2020 1005 Last data filed at 10/22/2020 0817 Gross per 24 hour  Intake 716 ml  Output 301 ml  Net 415 ml   Last 3 Weights 10/22/2020 10/21/2020 10/20/2020  Weight (lbs) 169 lb 11.2 oz 170 lb 13.7 oz 166 lb 7.2 oz  Weight (kg) 76.975 kg 77.5 kg 75.5 kg      Telemetry    Normal sinus rhythm with a first-degree AV block.- Personally Reviewed    Physical Exam  Sitting up in chair watching television when I entered the room.  She said she had not eaten but there was a open can of soft drink on her tray. GEN: No acute distress.   Neck: No JVD Cardiac: RRR, no murmurs, rubs, or gallops.  Respiratory: Clear to auscultation bilaterally. GI: Soft, nontender, non-distended  MS: No edema; left lower leg is still wrapped. Neuro:  Nonfocal   Psych: Normal affect   Labs    High Sensitivity Troponin:   Recent Labs  Lab 10/15/20 0018 10/15/20 0243 10/16/20 0844  TROPONINIHS 36* 102* 243*      Chemistry Recent Labs  Lab 10/20/20 0556 10/21/20 0400 10/22/20 0343  NA 140 137 137  K 4.4 3.8 3.8  CL 108 108 108  CO2 22 20* 23  GLUCOSE 102* 107* 108*  BUN 78* 70* 63*  CREATININE 3.61* 3.28* 3.06*  CALCIUM 8.1* 8.3* 8.3*  GFRNONAA 13* 14* 15*  ANIONGAP 10 9 6      Hematology Recent Labs  Lab 10/20/20 0556 10/21/20 0400 10/22/20 0343  WBC 7.3 7.8 8.9  RBC 2.40* 2.89* 2.92*  HGB 7.0* 8.5* 8.5*  HCT 23.1* 26.7* 27.2*  MCV 96.3 92.4 93.2  MCH 29.2 29.4 29.1  MCHC 30.3 31.8 31.3  RDW 13.1 13.2 13.5  PLT 245 283 307    BNPNo results for input(s): BNP, PROBNP in the last 168 hours.   DDimer No results for input(s): DDIMER in the last 168 hours.   Radiology    No results found.  Cardiac Studies  10-15-20  1. Compared to echo report from 2017, LVEF is down and wall motion  changes are new.   2. There is hypokinesis of the lateral wall, distal anterior and akinesis  of the distal inferior, distal inferolateral, distal inferoseptal and  apical walls . Left ventricular ejection fraction, by estimation, is 45%.  The left ventricular internal  cavity size was mildly dilated. There is mild left ventricular  hypertrophy. Left ventricular diastolic parameters are consistent with  Grade II diastolic dysfunction (pseudonormalization).   3. Right ventricular systolic function is low normal. The right  ventricular size is normal. There is moderately elevated pulmonary artery  systolic pressure.   4. Left atrial size was moderately dilated.   5. Mild mitral valve regurgitation.   6. The aortic valve is tricuspid. Aortic valve regurgitation is not  visualized. Mild to moderate aortic valve sclerosis/calcification is  present, without any evidence of aortic stenosis.   7. The inferior vena cava is dilated in size  with >50% respiratory  variability, suggesting right atrial pressure of 8 mmHg.     Assessment & Plan  Pre Procedural request to clear for GI eval.  Congestive heart failure, recent worsening unknown etiology             echo shows slight reduction in EF to 45% 2.  Renal insufficiency, significant, chronic but some recent worsening 3.  Diabetes mellitus type II 4.  Dyslipidemia on therapy 5.  Lower extremity wounds ,right foot area is chronic whereas superficial on left shin is more recent  Recommendation: Patient is awake and alert, vital signs are stable.  Renal function is slightly improved possibly in association with increase in hemoglobin following transfusion. I believe she is hemodynamically and clinically stable for planned mild/moderate anesthesia and GI evaluation. She is currently on reasonable dose of carvedilol as well as nitrate and hydralazine.  (Vasodilator therapy in the face of renal insufficiency).  I note that patient is on full dose aspirin.  In my opinion 81 mg, reduced adult dose aspirin should be sufficient and less likely to cause GI issues. I would personally prescribe parenteral iron while in the hospital as this is likely to be important in preventing your anemia and worsening of both heart function as well as renal dysfunction. As noted on my previous note patient has seen Dr. Harl Bowie in the past and would recommend follow-up with him in the future as he can decide whether is appropriate at that time to do ischemia evaluation for her reduction in ejection fraction or consider it a result of her worsening renal function and anemia etc.  In view of her renal insufficiency and lack of symptoms there is no plan at present for interventional or invasive studies. We will again sign off but feel free to call if further questions arise.   For questions or updates, please contact [place] Please consult [email or web address] for contact info under        Signed, Abel Presto, MD  10/22/2020, 10:05 AM

## 2020-10-22 NOTE — Progress Notes (Signed)
PROGRESS NOTE  Sabrina Wheeler RKY:[id number] DOB: 07-23-1945 DOA: 10/14/2020 PCP: [facility]  Brief History:  75 y.o. female with medical history of hypertension, diabetes mellitus, hyperlipidemia, CKD presenting with 2 to 3-day history of shortness of breath and chest pain.  The patient is a difficult historian at best.  She states that she has been having substernal chest pain and shortness of breath for the past 2 to 3 days.  She states that her chest pain occurs even at rest.  She states that she has chronic lower extremity edema but thinks that it is about the same as usual.  She has a nonproductive cough.  She denies any fevers, chills, headache, neck pain, nausea, vomiting or diarrhea.  She has not had any hemoptysis, hematochezia, melena.  The patient states that she normally sleeps in a recliner and has been doing so for the last 2 to 3 years.  She states that she has not taken any prescription medication for the past 3 years.  Apparently, the patient's primary care provider, the patient has not reestablished with any provider since then.  Upon EMS arrival, the patient was noted to have oxygen saturation 85% on room air. In the emergency department, the patient was afebrile and hemodynamically stable with oxygen saturation 90% on room air.  She was placed on 2 L with oxygen saturation up to 100%.  BMP shows sodium 138, potassium 4.8, serum creatinine 2.84.  WBC 13.0, hemoglobin 8.1, platelets 275,000.  Troponin 36>>102.  Chest x-ray showed bilateral patchy opacities.  She was started on IV heparin.  She had about 72 hours of IV heparin and remained stable after discontinuation.   Assessment/Plan:  Acute systolic and diastolic CHF -initially on IV furosemide 60 mg BID -8/10 PM--developed resp distress requiring NTG drip and additional lasix 80 mg  -8/11 AM--resp distress again requiring NTG drip -Daily weights -Accurate I's and O's -8/10 Echo--EF  45%,+HK lateral wall; +AK inf and inferolateral; G2DD, PASP 45.5 -d/c nitroglycerin drip 8/12 -hold diuretic 8/14 and 8/15 due to uptrending serum creatinine>>serum creatinine beginning to improve -restart lasix 40 mg po daily on 10/23/20 if creatinine continues to improve -will need repeat BMP within one week on discharge--discussed with patient and daughter   Chest pain/elevated troponin -Difficult to ascertain whether it is atypical or typical -Troponins elevated secondary to demand ischemia from CHF -Cardiology consult appreciated -d/c IV heparin--she had >48 hours during this hospitalization -continue Aspirin  Acute Anemia/FOBT+ -Hgb steadily trending down since admissino -GI consulted>>plans EGD/colonoscopy 8/18 -one unit PRBC given 10/20/20   Acute Respiratory failure with hypoxia -due to pulmonary edema -85% on RA initially -required up to 15L HFNC in PM 8/10 -now back to RA -continue empiric ceftriaxone and azithro due to increasing PCT -finished 5 days ceftriaxone and azithro during hospitalization   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 -Baseline creatinine 1.9-2.2 -Presented with serum creatinine 2.84 -Suspect patient may have progression of her underlying CKD -will need to tolerated worsen renal function for euvolemia and improved oxygenation -continue to hold diuretic due to uptrending serum creatinine -serum creatinine pleateaued at 3.75, since then trending down to 3.0 today -restart lasix 40 mg po daily on 10/23/20 if creatinine continues to improve   Essential hypertension -Had not taken any antihypertensive medications for 3 years -Previously took amlodipine, carvedilol, lisinopril -Monitor with diuresis -d/c lisinopril due to CKD -restarted coreg -d/c amlodipine due to decrease EF  Hyperlipidemia -LDL 43 -Previously took Lipitor--restart   Diabetes mellitus type 2 -Was not previously taking any agents -Check hemoglobin A1c--5.9   Left pretibial  wound -Wound care consult appreciated -not infected   Anemia of chronic disease -B12--239>>supplement -iron saturation 7% -ferritin 132 -supplement ferrtin IV and B12 IM x 1 -check FOBT--POSITIVE -d/c home with ferrous sulfate and B12 supplement        Status is: Inpatient  Remains inpatient appropriate because:Inpatient level of care appropriate due to severity of illness  Dispo: The patient is from: Home              Anticipated d/c is to: Home              Patient currently is not medically stable to d/c.   Difficult to place patient No        Family Communication:   Daughter updated at bedside 8/16  Consultants:  cardiology, GI  Code Status:  FULL   DVT Prophylaxis:  SCDs   Procedures: As Listed in Progress Note Above  Antibiotics: None        Subjective:  Respiratory status has improved, denies any further shortness of breath.  She is currently undergoing GoLytely prep  Objective: Vitals:   10/21/20 2139 10/22/20 0534 10/22/20 0535 10/22/20 1155  BP: (!) 140/48 (!) 153/61  (!) 155/54  Pulse: (!) 57 65  (!) 57  Resp: 18 19  18   Temp: 98 F (36.7 C) 98 F (36.7 C)  97.8 F (36.6 C)  TempSrc: Oral Oral  Oral  SpO2: 96% 97%  99%  Weight:   77 kg   Height:        Intake/Output Summary (Last 24 hours) at 10/22/2020 2009 Last data filed at 10/22/2020 1821 Gross per 24 hour  Intake 250.54 ml  Output 301 ml  Net -50.46 ml   Weight change: -0.524 kg Exam:  General:  Pt is alert, follows commands appropriately, not in acute distress HEENT: No icterus, No thrush, No neck mass, [place]/AT Cardiovascular: RRR, S1/S2, no rubs, no gallops Respiratory: Clear bilaterally Abdomen: Soft/+BS, non tender, non distended, no guarding Extremities: trace LE edema, No lymphangitis, No petechiae, No rashes, no synovitis   Data Reviewed: I have personally reviewed following labs and imaging studies Basic Metabolic Panel: Recent Labs  Lab 10/17/20 0404  10/18/20 0344 10/19/20 0330 10/20/20 0556 10/21/20 0400 10/22/20 0343  NA 140 138 138 140 137 137  K 4.2 4.2 4.3 4.4 3.8 3.8  CL 107 110 109 108 108 108  CO2 22 22 23 22  20* 23  GLUCOSE 149* 116* 107* 102* 107* 108*  BUN 52* 63* 73* 78* 70* 63*  CREATININE 2.91* 3.33* 3.75* 3.61* 3.28* 3.06*  CALCIUM 8.6* 8.3* 7.8* 8.1* 8.3* 8.3*  MG 1.6*  --   --   --   --   --    Liver Function Tests: No results for input(s): AST, ALT, ALKPHOS, BILITOT, PROT, ALBUMIN in the last 168 hours. No results for input(s): LIPASE, AMYLASE in the last 168 hours. No results for input(s): AMMONIA in the last 168 hours. Coagulation Profile: No results for input(s): INR, PROTIME in the last 168 hours. CBC: Recent Labs  Lab 10/18/20 0344 10/19/20 0330 10/20/20 0556 10/21/20 0400 10/22/20 0343  WBC 10.8* 8.3 7.3 7.8 8.9  HGB 7.8* 7.4* 7.0* 8.5* 8.5*  HCT 25.2* 24.1* 23.1* 26.7* 27.2*  MCV 94.0 93.4 96.3 92.4 93.2  PLT 249 259 245 283 307   Cardiac  Enzymes: No results for input(s): CKTOTAL, CKMB, CKMBINDEX, TROPONINI in the last 168 hours. BNP: Invalid input(s): POCBNP CBG: No results for input(s): GLUCAP in the last 168 hours. HbA1C: No results for input(s): HGBA1C in the last 72 hours. Urine analysis:    Component Value Date/Time   COLORURINE STRAW (A) 10/16/2020 1439   APPEARANCEUR HAZY (A) 10/16/2020 1439   LABSPEC 1.006 10/16/2020 1439   PHURINE 5.0 10/16/2020 1439   GLUCOSEU 50 (A) 10/16/2020 1439   HGBUR NEGATIVE 10/16/2020 1439   BILIRUBINUR NEGATIVE 10/16/2020 1439   KETONESUR NEGATIVE 10/16/2020 1439   PROTEINUR 100 (A) 10/16/2020 1439   NITRITE NEGATIVE 10/16/2020 1439   LEUKOCYTESUR NEGATIVE 10/16/2020 1439   Sepsis Labs: @LABRCNTIP (procalcitonin:4,lacticidven:4) ) Recent Results (from the past 240 hour(s))  Resp Panel by RT-PCR (Flu A&B, Covid) Nasopharyngeal Swab     Status: None   Collection Time: 10/15/20  3:40 AM   Specimen: Nasopharyngeal Swab; Nasopharyngeal(NP)  swabs in vial transport medium  Result Value Ref Range Status   SARS Coronavirus 2 by RT PCR NEGATIVE NEGATIVE Final    Comment: (NOTE) SARS-CoV-2 target nucleic acids are NOT DETECTED.  The SARS-CoV-2 RNA is generally detectable in upper respiratory specimens during the acute phase of infection. The lowest concentration of SARS-CoV-2 viral copies this assay can detect is 138 copies/mL. A negative result does not preclude SARS-Cov-2 infection and should not be used as the sole basis for treatment or other patient management decisions. A negative result may occur with  improper specimen collection/handling, submission of specimen other than nasopharyngeal swab, presence of viral mutation(s) within the areas targeted by this assay, and inadequate number of viral copies(<138 copies/mL). A negative result must be combined with clinical observations, patient history, and epidemiological information. The expected result is Negative.  Fact Sheet for Patients:  EntrepreneurPulse.com.au  Fact Sheet for Healthcare Providers:  IncredibleEmployment.be  This test is no t yet approved or cleared by the [place] FDA and  has been authorized for detection and/or diagnosis of SARS-CoV-2 by FDA under an Emergency Use Authorization (EUA). This EUA will remain  in effect (meaning this test can be used) for the duration of the COVID-19 declaration under Section 564(b)(1) of the Act, 21 U.S.C.section 360bbb-3(b)(1), unless the authorization is terminated  or revoked sooner.       Influenza A by PCR NEGATIVE NEGATIVE Final   Influenza B by PCR NEGATIVE NEGATIVE Final    Comment: (NOTE) The Xpert Xpress SARS-CoV-2/FLU/RSV plus assay is intended as an aid in the diagnosis of influenza from Nasopharyngeal swab specimens and should not be used as a sole basis for treatment. Nasal washings and aspirates are unacceptable for Xpert Xpress  SARS-CoV-2/FLU/RSV testing.  Fact Sheet for Patients: EntrepreneurPulse.com.au  Fact Sheet for Healthcare Providers: IncredibleEmployment.be  This test is not yet approved or cleared by the [place] FDA and has been authorized for detection and/or diagnosis of SARS-CoV-2 by FDA under an Emergency Use Authorization (EUA). This EUA will remain in effect (meaning this test can be used) for the duration of the COVID-19 declaration under Section 564(b)(1) of the Act, 21 U.S.C. section 360bbb-3(b)(1), unless the authorization is terminated or revoked.  Performed at [facility], [address] [place], [place] [address]   MRSA Next Gen by PCR, Nasal     Status: None   Collection Time: 10/15/20  6:34 PM   Specimen: Nasal Mucosa; Nasal Swab  Result Value Ref Range Status   MRSA by PCR Next Gen NOT DETECTED NOT  DETECTED Final    Comment: (NOTE) The GeneXpert MRSA Assay (FDA approved for NASAL specimens only), is one component of a comprehensive MRSA colonization surveillance program. It is not intended to diagnose MRSA infection nor to guide or monitor treatment for MRSA infections. Test performance is not FDA approved in patients less than 76 years old. Performed at [facility], [address], [place], [place] [address]   Culture, blood (Routine X 2) w Reflex to ID Panel     Status: None   Collection Time: 10/16/20  8:43 AM   Specimen: Right Antecubital; Blood  Result Value Ref Range Status   Specimen Description RIGHT ANTECUBITAL  Final   Special Requests   Final    BOTTLES DRAWN AEROBIC AND ANAEROBIC Blood Culture adequate volume   Culture   Final    NO GROWTH 5 DAYS Performed at [facility], [address], [place], [place] [address]    Report Status 10/21/2020 FINAL  Final  Culture, blood (Routine X 2) w Reflex to ID Panel     Status: None   Collection Time: 10/16/20  8:44 AM   Specimen: BLOOD RIGHT HAND  Result Value Ref Range  Status   Specimen Description BLOOD RIGHT HAND  Final   Special Requests   Final    BOTTLES DRAWN AEROBIC AND ANAEROBIC Blood Culture adequate volume   Culture   Final    NO GROWTH 5 DAYS Performed at [facility], [address] [place], [place] [address]    Report Status 10/21/2020 FINAL  Final  Urine Culture     Status: Abnormal   Collection Time: 10/16/20  2:39 PM   Specimen: Urine, Clean Catch  Result Value Ref Range Status   Specimen Description   Final    URINE, CLEAN CATCH Performed at [facility], [address] [place], [place] [address]    Special Requests   Final    NONE Performed at [facility], [address] [place], [place] [address]    Culture MULTIPLE SPECIES PRESENT, SUGGEST RECOLLECTION (A)  Final   Report Status 10/18/2020 FINAL  Final     Scheduled Meds:  aspirin  325 mg Oral Daily   atorvastatin  80 mg Oral Daily   carvedilol  12.5 mg Oral BID WC   Chlorhexidine Gluconate Cloth  6 each Topical Daily   donepezil  5 mg Oral QHS   furosemide  40 mg Oral Daily   hydrALAZINE  25 mg Oral Q8H   nitroGLYCERIN  0.4 mg Transdermal Daily   pantoprazole  40 mg Oral BID   sodium chloride flush  3 mL Intravenous Q12H   vitamin B-12  500 mcg Oral Daily   Continuous Infusions:  sodium chloride     sodium chloride Stopped (10/22/20 1800)    Procedures/Studies: DG Chest 2 View  Result Date: 10/15/2020 CLINICAL DATA:  Chest pain and shortness of breath EXAM: CHEST - 2 VIEW COMPARISON:  11/30/2018 FINDINGS: Cardiac shadow is enlarged but stable. Vascular congestion is noted with patchy airspace disease likely representing parenchymal edema. Small posterior effusions are noted. No pneumothorax is noted. No bony abnormality is seen. IMPRESSION: Changes consistent with CHF with patchy parenchymal edema. Electronically Signed   By: Inez Catalina M.D.   On: 10/15/2020 00:50   DG CHEST PORT 1 VIEW  Result Date: 10/15/2020 CLINICAL DATA:  Intermittent chest pain and  shortness of breath EXAM: PORTABLE CHEST 1 VIEW COMPARISON:  10/15/2020 12:20 a.m. FINDINGS: Unchanged enlarged cardiac silhouette. Redemonstrated vascular  congestion and patchy airspace opacities. Trace pleural effusions. No pneumothorax. No acute osseous abnormality. IMPRESSION: Overall unchanged patchy parenchymal opacities, concerning for edema. Electronically Signed   By: Merilyn Baba MD   On: 10/15/2020 16:07   ECHOCARDIOGRAM COMPLETE  Result Date: 10/15/2020    ECHOCARDIOGRAM REPORT   Patient Name:   KORRINE SICARD Date of Exam: 10/15/2020 Medical Rec #:  [id number]        Height:       62.0 in Accession #:    [id number]       Weight:       180.8 lb Date of Birth:  1946/01/03        BSA:          1.831 m Patient Age:    42 years         BP:           149/61 mmHg Patient Gender: F                HR:           62 bpm. Exam Location:  [facility] Procedure: 2D Echo, Cardiac Doppler and Color Doppler Indications:    CHF-Acute Diastolic  History:        Patient has prior history of Echocardiogram examinations, most                 recent 01/14/2016. CHF, CAD, Arrythmias:LBBB,                 Signs/Symptoms:Shortness of Breath; Risk Factors:Hypertension                 and Dyslipidemia.  Sonographer:    Wenda Low Referring Phys: [phone] DAVID TAT IMPRESSIONS  1. Compared to echo report from 2017, LVEF is down and wall motion changes are new.  2. There is hypokinesis of the lateral wall, distal anterior and akinesis of the distal inferior, distal inferolateral, distal inferoseptal and apical walls . Left ventricular ejection fraction, by estimation, is 45%. The left ventricular internal cavity size was mildly dilated. There is mild left ventricular hypertrophy. Left ventricular diastolic parameters are consistent with Grade II diastolic dysfunction (pseudonormalization).  3. Right ventricular systolic function is low normal. The right ventricular size is normal. There is moderately elevated pulmonary artery  systolic pressure.  4. Left atrial size was moderately dilated.  5. Mild mitral valve regurgitation.  6. The aortic valve is tricuspid. Aortic valve regurgitation is not visualized. Mild to moderate aortic valve sclerosis/calcification is present, without any evidence of aortic stenosis.  7. The inferior vena cava is dilated in size with >50% respiratory variability, suggesting right atrial pressure of 8 mmHg. FINDINGS  Left Ventricle: There is hypokinesis of the lateral wall, distal anterior and akinesis of the distal inferior, distal inferolateral, distal inferoseptal and apical walls. Left ventricular ejection fraction, by estimation, is 45%%. The left ventricle has  mildly decreased function. The left ventricular internal cavity size was mildly dilated. There is mild left ventricular hypertrophy. Left ventricular diastolic parameters are consistent with Grade II diastolic dysfunction (pseudonormalization). Right Ventricle: The right ventricular size is normal. Right vetricular wall thickness was not assessed. Right ventricular systolic function is low normal. There is moderately elevated pulmonary artery systolic pressure. The tricuspid regurgitant velocity is 3.06 m/s, and with an assumed right atrial pressure of 8 mmHg, the estimated right ventricular systolic pressure is 09.3 mmHg. Left Atrium: Left atrial size was moderately dilated. Right Atrium: Right atrial size was normal  in size. Pericardium: Trivial pericardial effusion is present. Mitral Valve: There is mild thickening of the mitral valve leaflet(s). There is mild calcification of the mitral valve leaflet(s). Mild to moderate mitral annular calcification. Mild mitral valve regurgitation. MV peak gradient, 13.4 mmHg. The mean mitral valve gradient is 5.0 mmHg. Tricuspid Valve: The tricuspid valve is normal in structure. Tricuspid valve regurgitation is mild. Aortic Valve: The aortic valve is tricuspid. Aortic valve regurgitation is not visualized. Mild  to moderate aortic valve sclerosis/calcification is present, without any evidence of aortic stenosis. Aortic valve mean gradient measures 7.0 mmHg. Aortic valve peak gradient measures 12.8 mmHg. Aortic valve area, by VTI measures 1.81 cm. Pulmonic Valve: The pulmonic valve was normal in structure. Pulmonic valve regurgitation is trivial. Aorta: The aortic root and ascending aorta are structurally normal, with no evidence of dilitation. Venous: The inferior vena cava is dilated in size with greater than 50% respiratory variability, suggesting right atrial pressure of 8 mmHg. IAS/Shunts: No atrial level shunt detected by color flow Doppler.  LEFT VENTRICLE PLAX 2D LVIDd:         5.28 cm      Diastology LVIDs:         4.00 cm      LV e' medial:    8.76 cm/s LV PW:         1.26 cm      LV E/e' medial:  18.7 LV IVS:        1.11 cm      LV e' lateral:   10.20 cm/s LVOT diam:     2.00 cm      LV E/e' lateral: 16.1 LV SV:         84 LV SV Index:   46 LVOT Area:     3.14 cm  LV Volumes (MOD) LV vol d, MOD A2C: 136.0 ml LV vol d, MOD A4C: 114.0 ml LV vol s, MOD A2C: 69.1 ml LV vol s, MOD A4C: 67.1 ml LV SV MOD A2C:     66.9 ml LV SV MOD A4C:     114.0 ml LV SV MOD BP:      61.6 ml RIGHT VENTRICLE RV Basal diam:  3.24 cm RV Mid diam:    2.50 cm RV S prime:     15.30 cm/s TAPSE (M-mode): 2.6 cm LEFT ATRIUM             Index       RIGHT ATRIUM           Index LA diam:        4.80 cm 2.62 cm/m  RA Area:     15.90 cm LA Vol (A2C):   93.6 ml 51.11 ml/m RA Volume:   40.90 ml  22.33 ml/m LA Vol (A4C):   88.3 ml 48.22 ml/m LA Biplane Vol: 96.4 ml 52.64 ml/m  AORTIC VALVE AV Area (Vmax):    1.90 cm AV Area (Vmean):   1.78 cm AV Area (VTI):     1.81 cm AV Vmax:           179.00 cm/s AV Vmean:          124.000 cm/s AV VTI:            0.463 m AV Peak Grad:      12.8 mmHg AV Mean Grad:      7.0 mmHg LVOT Vmax:         108.00 cm/s LVOT Vmean:  70.300 cm/s LVOT VTI:          0.267 m LVOT/AV VTI ratio: 0.58  AORTA Ao Root  diam: 3.00 cm Ao Asc diam:  3.00 cm MITRAL VALVE                TRICUSPID VALVE MV Area (PHT): 3.91 cm     TR Peak grad:   37.5 mmHg MV Area VTI:   1.94 cm     TR Vmax:        306.00 cm/s MV Peak grad:  13.4 mmHg MV Mean grad:  5.0 mmHg     SHUNTS MV Vmax:       1.83 m/s     Systemic VTI:  0.27 m MV Vmean:      104.0 cm/s   Systemic Diam: 2.00 cm MV Decel Time: 194 msec MV E velocity: 164.00 cm/s MV A velocity: 102.00 cm/s MV E/A ratio:  1.61 Dorris Carnes MD Electronically signed by Dorris Carnes MD Signature Date/Time: 10/15/2020/9:18:51 PM    Final     Kathie Dike, MD  [facility]  If 7PM-7AM, please contact night-coverage [email or web address]  10/22/2020, 8:09 PM   LOS: 6 days

## 2020-10-23 ENCOUNTER — Encounter (HOSPITAL_COMMUNITY): Payer: Self-pay | Admitting: Internal Medicine

## 2020-10-23 ENCOUNTER — Encounter (HOSPITAL_COMMUNITY)
Admission: EM | Disposition: A | Discharge status: Home / Self Care | Payer: Self-pay | Source: Home / Self Care | Attending: Internal Medicine

## 2020-10-23 ENCOUNTER — Inpatient Hospital Stay (HOSPITAL_COMMUNITY): Payer: Medicare Other | Admitting: Anesthesiology

## 2020-10-23 DIAGNOSIS — K222 Esophageal obstruction: Secondary | ICD-10-CM

## 2020-10-23 DIAGNOSIS — D509 Iron deficiency anemia, unspecified: Secondary | ICD-10-CM

## 2020-10-23 DIAGNOSIS — D124 Benign neoplasm of descending colon: Secondary | ICD-10-CM

## 2020-10-23 HISTORY — PX: BIOPSY: SHX5522

## 2020-10-23 HISTORY — PX: COLONOSCOPY WITH PROPOFOL: SHX5780

## 2020-10-23 HISTORY — PX: POLYPECTOMY: SHX5525

## 2020-10-23 HISTORY — PX: ESOPHAGOGASTRODUODENOSCOPY (EGD) WITH PROPOFOL: SHX5813

## 2020-10-23 LAB — CBC
HCT: 28.3 % — ABNORMAL LOW (ref 36.0–46.0)
Hemoglobin: 9.1 g/dL — ABNORMAL LOW (ref 12.0–15.0)
MCH: 30 pg (ref 26.0–34.0)
MCHC: 32.2 g/dL (ref 30.0–36.0)
MCV: 93.4 fL (ref 80.0–100.0)
Platelets: 287 10*3/uL (ref 150–400)
RBC: 3.03 MIL/uL — ABNORMAL LOW (ref 3.87–5.11)
RDW: 13.4 % (ref 11.5–15.5)
WBC: 8.6 10*3/uL (ref 4.0–10.5)
nRBC: 0 % (ref 0.0–0.2)

## 2020-10-23 LAB — BASIC METABOLIC PANEL
Anion gap: 7 (ref 5–15)
Anion gap: 5 (ref 5–15)
BUN: 9 mg/dL (ref 8–23)
BUN: 50 mg/dL — ABNORMAL HIGH (ref 8–23)
CO2: 29 mmol/L (ref 22–32)
CO2: 21 mmol/L — ABNORMAL LOW (ref 22–32)
Calcium: 8.6 mg/dL — ABNORMAL LOW (ref 8.9–10.3)
Calcium: 8.3 mg/dL — ABNORMAL LOW (ref 8.9–10.3)
Chloride: 103 mmol/L (ref 98–111)
Chloride: 110 mmol/L (ref 98–111)
Creatinine, Ser: 0.47 mg/dL (ref 0.44–1.00)
Creatinine, Ser: 2.65 mg/dL — ABNORMAL HIGH (ref 0.44–1.00)
GFR, Estimated: >60 mL/min (ref >60)
GFR, Estimated: 18 mL/min — ABNORMAL LOW (ref >60)
Glucose, Bld: 105 mg/dL — ABNORMAL HIGH (ref 70–99)
Glucose, Bld: 90 mg/dL (ref 70–99)
Potassium: 3.2 mmol/L — ABNORMAL LOW (ref 3.5–5.1)
Potassium: 3.9 mmol/L (ref 3.5–5.1)
Sodium: 139 mmol/L (ref 135–145)
Sodium: 136 mmol/L (ref 135–145)

## 2020-10-23 LAB — BLOOD GAS, VENOUS
Acid-base deficit: 5 mmol/L — ABNORMAL HIGH (ref 0.0–2.0)
Bicarbonate: 20 mmol/L (ref 20.0–28.0)
FIO2: 21
O2 Saturation: 82 %
Patient temperature: 36.4
pCO2, Ven: 36.6 mmHg — ABNORMAL LOW (ref 44.0–60.0)
pH, Ven: 7.348 (ref 7.250–7.430)
pO2, Ven: 51.5 mmHg — ABNORMAL HIGH (ref 32.0–45.0)

## 2020-10-23 LAB — GLUCOSE, CAPILLARY
Glucose-Capillary: 87 mg/dL (ref 70–99)
Glucose-Capillary: 94 mg/dL (ref 70–99)

## 2020-10-23 SURGERY — ESOPHAGOGASTRODUODENOSCOPY (EGD) WITH PROPOFOL
Anesthesia: General

## 2020-10-23 MED ORDER — LIDOCAINE HCL (PF) 2 % IJ SOLN
INTRAMUSCULAR | Status: AC
  Filled 2020-10-23: qty 5

## 2020-10-23 MED ORDER — LIDOCAINE HCL (CARDIAC) PF 100 MG/5ML IV SOSY
PREFILLED_SYRINGE | INTRAVENOUS | Status: DC | PRN
  Administered 2020-10-23: 100 mg via INTRATRACHEAL

## 2020-10-23 MED ORDER — ETOMIDATE 2 MG/ML IV SOLN
INTRAVENOUS | Status: DC | PRN
  Administered 2020-10-23 (×2): 2 mg via INTRAVENOUS
  Administered 2020-10-23 (×3): 4 mg via INTRAVENOUS
  Administered 2020-10-23 (×2): 2 mg via INTRAVENOUS

## 2020-10-23 MED ORDER — PROPOFOL 10 MG/ML IV BOLUS
INTRAVENOUS | Status: DC | PRN
  Administered 2020-10-23 (×2): 20 mg via INTRAVENOUS

## 2020-10-23 MED ORDER — KETAMINE HCL 50 MG/5ML IJ SOSY
PREFILLED_SYRINGE | INTRAMUSCULAR | Status: AC
  Filled 2020-10-23: qty 5

## 2020-10-23 MED ORDER — LACTATED RINGERS IV SOLN: INTRAVENOUS | Status: DC

## 2020-10-23 MED ORDER — KETAMINE HCL 10 MG/ML IJ SOLN
INTRAMUSCULAR | Status: DC | PRN
  Administered 2020-10-23: 20 mg via INTRAVENOUS
  Administered 2020-10-23: 5 mg via INTRAVENOUS

## 2020-10-23 MED ORDER — ETOMIDATE 2 MG/ML IV SOLN
INTRAVENOUS | Status: AC
  Filled 2020-10-23: qty 10

## 2020-10-23 NOTE — Op Note (Addendum)
[facility] Patient Name: Sabrina Wheeler Procedure Date: 10/23/2020 8:22 AM MRN: [id number] Date of Birth: [birth year] Attending MD: Norvel Richards , MD CSN: [id number] Age: 75 Admit Type: Inpatient Procedure:                Colonoscopy Indications:              Heme positive stool Providers:                Norvel Richards, MD, Janeece Riggers, RN, Dereck Leep, Technician Referring MD:              Medicines:                Propofol per Anesthesia Complications:            No immediate complications. Estimated Blood Loss:     Estimated blood loss was minimal. Procedure:                Pre-Anesthesia Assessment:                           - Prior to the procedure, a History and Physical                            was performed, and patient medications and                            allergies were reviewed. The patient's tolerance of                            previous anesthesia was also reviewed. The risks                            and benefits of the procedure and the sedation                            options and risks were discussed with the patient.                            All questions were answered, and informed consent                            was obtained. Prior Anticoagulants: The patient has                            taken no previous anticoagulant or antiplatelet                            agents. ASA Grade Assessment: III - A patient with                            severe systemic disease. After reviewing the risks  and benefits, the patient was deemed in                            satisfactory condition to undergo the procedure.                           After obtaining informed consent, the colonoscope                            was passed under direct vision. Throughout the                            procedure, the patient's blood pressure, pulse, and                            oxygen  saturations were monitored continuously. The                            [id number]) scope was introduced through the                            anus and advanced to the the cecum, identified by                            appendiceal orifice and ileocecal valve. The                            colonoscopy was performed without difficulty. The                            patient tolerated the procedure well. The quality                            of the bowel preparation was adequate. Scope In: 8:25:28 AM Scope Out: 8:48:05 AM Scope Withdrawal Time: 0 hours 10 minutes 11 seconds  Total Procedure Duration: 0 hours 22 minutes 37 seconds  Findings:      The perianal and digital rectal examinations were normal. Redundant       colon. Change in the patient's position and external abdominal pressure       required to reach the cecum.      Scattered small and large-mouthed diverticula were found in the entire       colon.      Two sessile polyps were found in the descending colon. The polyps were 2       to 5 mm in size. Small polyp removed with cold biopsy; larger polyp       removed with cold snare. Resection and retrieval were complete.       Estimated blood loss was minimal.      Non-bleeding internal hemorrhoids were found during retroflexion. The       hemorrhoids were moderate, large and Grade I (internal hemorrhoids that       do not prolapse). Prominent anal papilla also present. Impression:               - Diverticulosis in the entire examined colon.  Changes at 15 cm suspicious for prior colonic                            anastomosis                           - Two 2 to 5 mm polyps in the descending colon,                            removed with a cold snare. Resected and retrieved.                           - Non-bleeding internal hemorrhoids. Moderate Sedation:      Moderate (conscious) sedation was personally administered by an       anesthesia  professional. The following parameters were monitored: oxygen       saturation, heart rate, blood pressure, respiratory rate, EKG, adequacy       of pulmonary ventilation, and response to care. Recommendation:           See EGD report. Recommend proceeding with capsule                            study of the small intestine to complete GI                            evaluation. At patient request, I called Herbert Deaner at [phone] was unable to reach him.                           Addendum: Capsule equipment will not be available                            until tomorrow. Will allow clear liquids till                            midnight tonight and plan for capsule study                            tomorrow morning. Procedure Code(s):        --- Professional ---                           [phone], Colonoscopy, flexible; with removal of                            tumor(s), polyp(s), or other lesion(s) by snare                            technique Diagnosis Code(s):        --- Professional ---                           K64.0, First degree hemorrhoids  K63.5, Polyp of colon                           R19.5, Other fecal abnormalities                           K57.30, Diverticulosis of large intestine without                            perforation or abscess without bleeding CPT copyright 2019 [employer]. All rights reserved. The codes documented in this report are preliminary and upon coder review may  be revised to meet current compliance requirements. Cristopher Estimable. Lenetta Piche, MD Norvel Richards, MD 10/23/2020 9:10:22 AM This report has been signed electronically. Number of Addenda: 0

## 2020-10-23 NOTE — Transfer of Care (Signed)
Immediate Anesthesia Transfer of Care Note  Patient: Sabrina Wheeler  Procedure(s) Performed: ESOPHAGOGASTRODUODENOSCOPY (EGD) WITH PROPOFOL COLONOSCOPY WITH PROPOFOL BIOPSY POLYPECTOMY  Patient Location: PACU  Anesthesia Type:General  Level of Consciousness: awake, patient cooperative and responds to stimulation  Airway & Oxygen Therapy: Patient Spontanous Breathing  Post-op Assessment: Report given to RN, Post -op Vital signs reviewed and stable and Patient moving all extremities X 4  Post vital signs: Reviewed and stable  Last Vitals:  Vitals Value Taken Time  BP 137/52 10/23/20 0854  Temp    Pulse 59 10/23/20 0855  Resp 10 10/23/20 0855  SpO2 100 % 10/23/20 0855  Vitals shown include unvalidated device data.  Last Pain:  Vitals:   10/23/20 0719  TempSrc: Oral  PainSc:       Patients Stated Pain Goal: 0 (82/57/49 3552)  Complications: No notable events documented.

## 2020-10-23 NOTE — Interval H&P Note (Signed)
History and Physical Interval Note:  10/23/2020 8:03 AM  Sabrina Wheeler  has presented today for surgery, with the diagnosis of iron deficiency anemia.  The various methods of treatment have been discussed with the patient and family. After consideration of risks, benefits and other options for treatment, the patient has consented to  Procedure(s): ESOPHAGOGASTRODUODENOSCOPY (EGD) WITH PROPOFOL (N/A) COLONOSCOPY WITH PROPOFOL (N/A) as a surgical intervention.  The patient's history has been reviewed, patient examined, no change in status, stable for surgery.  I have reviewed the patient's chart and labs.  Questions were answered to the patient's satisfaction.     Sabrina Wheeler  Patient seen in short stay.  Remained stable overnight.  Hemoglobin 9.1 this morning. Patient denies abdominal pain or dysphagia.  We will proceed with EGD and colonoscopy today per plan. The risks, benefits, limitations, alternatives and imponderables have been reviewed with the patient. Questions have been answered. All parties are agreeable.    ASA 4.  Discussed with anesthesia.

## 2020-10-23 NOTE — Care Management Important Message (Signed)
Important Message  Patient Details  Name: Sabrina Wheeler MRN: [id number] Date of Birth: 02-13-1946   Medicare Important Message Given:  Yes     Tommy Medal 10/23/2020, 11:38 AM

## 2020-10-23 NOTE — Anesthesia Postprocedure Evaluation (Signed)
Anesthesia Post Note  Patient: Sabrina Wheeler  Procedure(s) Performed: ESOPHAGOGASTRODUODENOSCOPY (EGD) WITH PROPOFOL COLONOSCOPY WITH PROPOFOL BIOPSY POLYPECTOMY  Patient location during evaluation: Phase II Anesthesia Type: General Level of consciousness: awake and alert and oriented Pain management: pain level controlled Vital Signs Assessment: post-procedure vital signs reviewed and stable Respiratory status: spontaneous breathing and respiratory function stable Cardiovascular status: blood pressure returned to baseline and stable Postop Assessment: no apparent nausea or vomiting Anesthetic complications: no   No notable events documented.   Last Vitals:  Vitals:   10/23/20 1100 10/23/20 1456  BP: (!) 167/60 (!) 169/60  Pulse: 62 73  Resp: 20 20  Temp: 36.7 C (!) 36.4 C  SpO2: 96% 96%    Last Pain:  Vitals:   10/23/20 1456  TempSrc: Oral  PainSc:                  Parveen Freehling C Montrez [place]

## 2020-10-23 NOTE — Op Note (Signed)
[facility] Patient Name: Joeli Fenner Procedure Date: 10/23/2020 7:57 AM MRN: [id number] Date of Birth: 1945-06-10 Attending MD: Norvel Richards , MD CSN: [id number] Age: 75 Admit Type: Inpatient Procedure:                Upper GI endoscopy Indications:              Iron deficiency anemia, Heme positive stool Providers:                Norvel Richards, MD, Janeece Riggers, RN, Dereck Leep, Technician Referring MD:              Medicines:                Propofol per Anesthesia Complications:            No immediate complications. Estimated Blood Loss:     Estimated blood loss: none. Estimated blood loss:                            none. Procedure:                Pre-Anesthesia Assessment:                           - Prior to the procedure, a History and Physical                            was performed, and patient medications and                            allergies were reviewed. The patient's tolerance of                            previous anesthesia was also reviewed. The risks                            and benefits of the procedure and the sedation                            options and risks were discussed with the patient.                            All questions were answered, and informed consent                            was obtained. Prior Anticoagulants: The patient has                            taken no previous anticoagulant or antiplatelet                            agents. ASA Grade Assessment: III - A patient with  severe systemic disease. After reviewing the risks                            and benefits, the patient was deemed in                            satisfactory condition to undergo the procedure.                           After obtaining informed consent, the endoscope was                            passed under direct vision. Throughout the                            procedure,  the patient's blood pressure, pulse, and                            oxygen saturations were monitored continuously. The                            [id number] ([id number] scope was introduced through the                            mouth, and advanced to the second part of duodenum.                            The upper GI endoscopy was accomplished without                            difficulty. The patient tolerated the procedure                            well. Scope In: 8:15:23 AM Scope Out: 8:19:53 AM Total Procedure Duration: 0 hours 4 minutes 30 seconds  Findings:      A non-obstructing Schatzki ring was found at the gastroesophageal       junction.      A small hiatal hernia was present.      The exam was otherwise without abnormality. Normal-appearing first       second third portion of the duodenum Impression:               - Non-obstructing Schatzki ring.                           - Small hiatal hernia.                           - The examination was otherwise normal.                           - No specimens collected. Moderate Sedation:      Moderate (conscious) sedation was personally administered by an       anesthesia professional. The following parameters were monitored: oxygen       saturation, heart rate, blood pressure,  respiratory rate, EKG, adequacy       of pulmonary ventilation, and response to care. Recommendation:           - Return patient to hospital ward for ongoing care.                           - NPO. See colonoscopy report.                           - Continue present medications. Procedure Code(s):        --- Professional ---                           [phone], Esophagogastroduodenoscopy, flexible,                            transoral; diagnostic, including collection of                            specimen(s) by brushing or washing, when performed                            (separate procedure) Diagnosis Code(s):        --- Professional ---                            K22.2, Esophageal obstruction                           K44.9, Diaphragmatic hernia without obstruction or                            gangrene                           D50.9, Iron deficiency anemia, unspecified                           R19.5, Other fecal abnormalities CPT copyright 2019 [employer]. All rights reserved. The codes documented in this report are preliminary and upon coder review may  be revised to meet current compliance requirements. Cristopher Estimable. Neidra Girvan, MD Norvel Richards, MD 10/23/2020 9:02:51 AM This report has been signed electronically. Number of Addenda: 0

## 2020-10-23 NOTE — Progress Notes (Signed)
PROGRESS NOTE  Sabrina Wheeler EPP:[id number] DOB: [birth year] DOA: 10/14/2020 PCP: [facility]  Brief History:  75 y.o. female with medical history of hypertension, diabetes mellitus, hyperlipidemia, CKD presenting with 2 to 3-day history of shortness of breath and chest pain.  The patient is a difficult historian at best.  She states that she has been having substernal chest pain and shortness of breath for the past 2 to 3 days.  She states that her chest pain occurs even at rest.  She states that she has chronic lower extremity edema but thinks that it is about the same as usual.  She has a nonproductive cough.  She denies any fevers, chills, headache, neck pain, nausea, vomiting or diarrhea.  She has not had any hemoptysis, hematochezia, melena.  The patient states that she normally sleeps in a recliner and has been doing so for the last 2 to 3 years.  She states that she has not taken any prescription medication for the past 3 years.  Apparently, the patient's primary care provider, the patient has not reestablished with any provider since then.  Upon EMS arrival, the patient was noted to have oxygen saturation 85% on room air. In the emergency department, the patient was afebrile and hemodynamically stable with oxygen saturation 90% on room air.  She was placed on 2 L with oxygen saturation up to 100%.  BMP shows sodium 138, potassium 4.8, serum creatinine 2.84.  WBC 13.0, hemoglobin 8.1, platelets 275,000.  Troponin 36>>102.  Chest x-ray showed bilateral patchy opacities.  She was started on IV heparin.  She had about 72 hours of IV heparin and remained stable after discontinuation.   Assessment/Plan:  Acute systolic and diastolic CHF -initially on IV furosemide 60 mg BID -8/10 PM--developed resp distress requiring NTG drip and additional lasix 80 mg  -8/11 AM--resp distress again requiring NTG drip -Daily weights -Accurate I's and O's -8/10 Echo--EF  45%,+HK lateral wall; +AK inf and inferolateral; G2DD, PASP 45.5 -d/c nitroglycerin drip 8/12 -hold diuretic 8/14 and 8/15 due to uptrending serum creatinine>>serum creatinine beginning to improve -restart lasix 40 mg po daily on 10/22/20 if creatinine continues to improve -will need repeat BMP within one week on discharge--discussed with patient and daughter   Chest pain/elevated troponin -Difficult to ascertain whether it is atypical or typical -Troponins elevated secondary to demand ischemia from CHF -Cardiology consult appreciated -d/c IV heparin--she had >48 hours during this hospitalization -continue Aspirin  Acute Anemia/FOBT+ -Hgb steadily trending down since admission -GI consulted -Patient underwent EGD/colonoscopy on 8/18 which did not reveal any obvious source of blood loss -Plans are to undergo capsule study on 8/19 -one unit PRBC given 10/20/20 -Follow-up hemoglobins have been stable   Acute Respiratory failure with hypoxia -due to pulmonary edema -85% on RA initially -required up to 15L HFNC in PM 8/10 -now back to RA -continue empiric ceftriaxone and azithro due to increasing PCT -finished 5 days ceftriaxone and azithro during hospitalization   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 -Baseline creatinine 1.9-2.2 -Presented with serum creatinine 2.84 -Suspect patient may have progression of her underlying CKD -will need to tolerated worsen renal function for euvolemia and improved oxygenation -continue to hold diuretic due to uptrending serum creatinine -serum creatinine pleateaued at 3.75, since then trending down to 2.65 today -Renal function appears to be stable with oral Lasix   Essential hypertension -Had not taken any antihypertensive medications for 3 years -  Previously took amlodipine, carvedilol, lisinopril -Monitor with diuresis -d/c lisinopril due to CKD -restarted coreg -d/c amlodipine due to decrease EF   Hyperlipidemia -LDL 43 -Previously took  Lipitor--restart   Diabetes mellitus type 2 -Was not previously taking any agents -Check hemoglobin A1c--5.9   Left pretibial wound -Wound care consult appreciated -not infected   Anemia of chronic disease -B12--239>>supplement -iron saturation 7% -ferritin 132 -supplement ferrtin IV and B12 IM x 1 -check FOBT--POSITIVE -d/c home with ferrous sulfate and B12 supplement -Follow-up hemoglobins have been stable        Status is: Inpatient  Remains inpatient appropriate because:Inpatient level of care appropriate due to severity of illness  Dispo: The patient is from: Home              Anticipated d/c is to: Home              Patient currently is not medically stable to d/c.   Difficult to place patient No        Family Communication:   Daughter updated at bedside 8/18  Consultants:  cardiology, GI  Code Status:  FULL   DVT Prophylaxis:  SCDs   Procedures: As Listed in Progress Note Above  Antibiotics: None        Subjective:  She is feeling well today.  Denies any shortness of breath.  Objective: Vitals:   10/23/20 0900 10/23/20 0915 10/23/20 1100 10/23/20 1456  BP: (!) 151/57 (!) 141/51 (!) 167/60 (!) 169/60  Pulse: (!) 59 65 62 73  Resp: 11 20 20 20   Temp: 98.2 F (36.8 C)  98 F (36.7 C) (!) 97.5 F (36.4 C)  TempSrc:    Oral  SpO2: 100% 100% 96% 96%  Weight:      Height:        Intake/Output Summary (Last 24 hours) at 10/23/2020 1933 Last data filed at 10/23/2020 1500 Gross per 24 hour  Intake 857.53 ml  Output 350 ml  Net 507.53 ml   Weight change: -1.475 kg Exam:  General exam: Alert, awake, oriented x 3 Respiratory system: Clear to auscultation. Respiratory effort normal. Cardiovascular system:RRR. No murmurs, rubs, gallops. Gastrointestinal system: Abdomen is nondistended, soft and nontender. No organomegaly or masses felt. Normal bowel sounds heard. Central nervous system: Alert and oriented. No focal neurological  deficits. Extremities: No C/C/E, +pedal pulses Skin: No rashes, lesions or ulcers Psychiatry: Judgement and insight appear normal. Mood & affect appropriate.     Data Reviewed: I have personally reviewed following labs and imaging studies Basic Metabolic Panel: Recent Labs  Lab 10/17/20 0404 10/18/20 0344 10/20/20 0556 10/21/20 0400 10/22/20 0343 10/23/20 1159 10/23/20 1445  NA 140   < > 140 137 137 139 136  K 4.2   < > 4.4 3.8 3.8 3.2* 3.9  CL 107   < > 108 108 108 103 110  CO2 22   < > 22 20* 23 29 21*  GLUCOSE 149*   < > 102* 107* 108* 105* 90  BUN 52*   < > 78* 70* 63* 9 50*  CREATININE 2.91*   < > 3.61* 3.28* 3.06* 0.47 2.65*  CALCIUM 8.6*   < > 8.1* 8.3* 8.3* 8.6* 8.3*  MG 1.6*  --   --   --   --   --   --    < > = values in this interval not displayed.   Liver Function Tests: No results for input(s): AST, ALT, ALKPHOS, BILITOT, PROT, ALBUMIN in the  last 168 hours. No results for input(s): LIPASE, AMYLASE in the last 168 hours. No results for input(s): AMMONIA in the last 168 hours. Coagulation Profile: No results for input(s): INR, PROTIME in the last 168 hours. CBC: Recent Labs  Lab 10/19/20 0330 10/20/20 0556 10/21/20 0400 10/22/20 0343 10/22/20 2157 10/23/20 0608  WBC 8.3 7.3 7.8 8.9  --  8.6  HGB 7.4* 7.0* 8.5* 8.5* 9.4* 9.1*  HCT 24.1* 23.1* 26.7* 27.2* 29.4* 28.3*  MCV 93.4 96.3 92.4 93.2  --  93.4  PLT 259 245 283 307  --  287   Cardiac Enzymes: No results for input(s): CKTOTAL, CKMB, CKMBINDEX, TROPONINI in the last 168 hours. BNP: Invalid input(s): POCBNP CBG: Recent Labs  Lab 10/23/20 0720 10/23/20 0910  GLUCAP 87 94   HbA1C: No results for input(s): HGBA1C in the last 72 hours. Urine analysis:    Component Value Date/Time   COLORURINE STRAW (A) 10/16/2020 1439   APPEARANCEUR HAZY (A) 10/16/2020 1439   LABSPEC 1.006 10/16/2020 1439   PHURINE 5.0 10/16/2020 1439   GLUCOSEU 50 (A) 10/16/2020 1439   HGBUR NEGATIVE 10/16/2020 1439    BILIRUBINUR NEGATIVE 10/16/2020 1439   KETONESUR NEGATIVE 10/16/2020 1439   PROTEINUR 100 (A) 10/16/2020 1439   NITRITE NEGATIVE 10/16/2020 1439   LEUKOCYTESUR NEGATIVE 10/16/2020 1439   Sepsis Labs: @LABRCNTIP (procalcitonin:4,lacticidven:4) ) Recent Results (from the past 240 hour(s))  Resp Panel by RT-PCR (Flu A&B, Covid) Nasopharyngeal Swab     Status: None   Collection Time: 10/15/20  3:40 AM   Specimen: Nasopharyngeal Swab; Nasopharyngeal(NP) swabs in vial transport medium  Result Value Ref Range Status   SARS Coronavirus 2 by RT PCR NEGATIVE NEGATIVE Final    Comment: (NOTE) SARS-CoV-2 target nucleic acids are NOT DETECTED.  The SARS-CoV-2 RNA is generally detectable in upper respiratory specimens during the acute phase of infection. The lowest concentration of SARS-CoV-2 viral copies this assay can detect is 138 copies/mL. A negative result does not preclude SARS-Cov-2 infection and should not be used as the sole basis for treatment or other patient management decisions. A negative result may occur with  improper specimen collection/handling, submission of specimen other than nasopharyngeal swab, presence of viral mutation(s) within the areas targeted by this assay, and inadequate number of viral copies(<138 copies/mL). A negative result must be combined with clinical observations, patient history, and epidemiological information. The expected result is Negative.  Fact Sheet for Patients:  EntrepreneurPulse.com.au  Fact Sheet for Healthcare Providers:  IncredibleEmployment.be  This test is no t yet approved or cleared by the [place] FDA and  has been authorized for detection and/or diagnosis of SARS-CoV-2 by FDA under an Emergency Use Authorization (EUA). This EUA will remain  in effect (meaning this test can be used) for the duration of the COVID-19 declaration under Section 564(b)(1) of the Act, 21 U.S.C.section  360bbb-3(b)(1), unless the authorization is terminated  or revoked sooner.       Influenza A by PCR NEGATIVE NEGATIVE Final   Influenza B by PCR NEGATIVE NEGATIVE Final    Comment: (NOTE) The Xpert Xpress SARS-CoV-2/FLU/RSV plus assay is intended as an aid in the diagnosis of influenza from Nasopharyngeal swab specimens and should not be used as a sole basis for treatment. Nasal washings and aspirates are unacceptable for Xpert Xpress SARS-CoV-2/FLU/RSV testing.  Fact Sheet for Patients: EntrepreneurPulse.com.au  Fact Sheet for Healthcare Providers: IncredibleEmployment.be  This test is not yet approved or cleared by the [place] and has been authorized  for detection and/or diagnosis of SARS-CoV-2 by FDA under an Emergency Use Authorization (EUA). This EUA will remain in effect (meaning this test can be used) for the duration of the COVID-19 declaration under Section 564(b)(1) of the Act, 21 U.S.C. section 360bbb-3(b)(1), unless the authorization is terminated or revoked.  Performed at [facility], [address] [place], [place] [address]   MRSA Next Gen by PCR, Nasal     Status: None   Collection Time: 10/15/20  6:34 PM   Specimen: Nasal Mucosa; Nasal Swab  Result Value Ref Range Status   MRSA by PCR Next Gen NOT DETECTED NOT DETECTED Final    Comment: (NOTE) The GeneXpert MRSA Assay (FDA approved for NASAL specimens only), is one component of a comprehensive MRSA colonization surveillance program. It is not intended to diagnose MRSA infection nor to guide or monitor treatment for MRSA infections. Test performance is not FDA approved in patients less than 1 years old. Performed at [facility], [address] [place], [place] [address]   Culture, blood (Routine X 2) w Reflex to ID Panel     Status: None   Collection Time: 10/16/20  8:43 AM   Specimen: Right Antecubital; Blood  Result Value Ref Range Status   Specimen  Description RIGHT ANTECUBITAL  Final   Special Requests   Final    BOTTLES DRAWN AEROBIC AND ANAEROBIC Blood Culture adequate volume   Culture   Final    NO GROWTH 5 DAYS Performed at [facility], [address], [place], [place] [address]    Report Status 10/21/2020 FINAL  Final  Culture, blood (Routine X 2) w Reflex to ID Panel     Status: None   Collection Time: 10/16/20  8:44 AM   Specimen: BLOOD RIGHT HAND  Result Value Ref Range Status   Specimen Description BLOOD RIGHT HAND  Final   Special Requests   Final    BOTTLES DRAWN AEROBIC AND ANAEROBIC Blood Culture adequate volume   Culture   Final    NO GROWTH 5 DAYS Performed at [facility], [address] [place], [place] [address]    Report Status 10/21/2020 FINAL  Final  Urine Culture     Status: Abnormal   Collection Time: 10/16/20  2:39 PM   Specimen: Urine, Clean Catch  Result Value Ref Range Status   Specimen Description   Final    URINE, CLEAN CATCH Performed at [facility], [address] [place], [place] [address]    Special Requests   Final    NONE Performed at [facility], [address], [place], [place] [address]    Culture MULTIPLE SPECIES PRESENT, SUGGEST RECOLLECTION (A)  Final   Report Status 10/18/2020 FINAL  Final     Scheduled Meds:  aspirin  325 mg Oral Daily   atorvastatin  80 mg Oral Daily   carvedilol  12.5 mg Oral BID WC   Chlorhexidine Gluconate Cloth  6 each Topical Daily   donepezil  5 mg Oral QHS   furosemide  40 mg Oral Daily   hydrALAZINE  25 mg Oral Q8H   nitroGLYCERIN  0.4 mg Transdermal Daily   pantoprazole  40 mg Oral BID   sodium chloride flush  3 mL Intravenous Q12H   vitamin B-12  500 mcg Oral Daily   Continuous Infusions:  sodium chloride      Procedures/Studies: DG Chest 2 View  Result Date: 10/15/2020 CLINICAL DATA:  Chest pain and shortness of breath EXAM: CHEST -  2 VIEW COMPARISON:  11/30/2018 FINDINGS: Cardiac shadow is enlarged but stable. Vascular congestion  is noted with patchy airspace disease likely representing parenchymal edema. Small posterior effusions are noted. No pneumothorax is noted. No bony abnormality is seen. IMPRESSION: Changes consistent with CHF with patchy parenchymal edema. Electronically Signed   By: Inez Catalina M.D.   On: 10/15/2020 00:50   DG CHEST PORT 1 VIEW  Result Date: 10/15/2020 CLINICAL DATA:  Intermittent chest pain and shortness of breath EXAM: PORTABLE CHEST 1 VIEW COMPARISON:  10/15/2020 12:20 a.m. FINDINGS: Unchanged enlarged cardiac silhouette. Redemonstrated vascular congestion and patchy airspace opacities. Trace pleural effusions. No pneumothorax. No acute osseous abnormality. IMPRESSION: Overall unchanged patchy parenchymal opacities, concerning for edema. Electronically Signed   By: Merilyn Baba MD   On: 10/15/2020 16:07   ECHOCARDIOGRAM COMPLETE  Result Date: 10/15/2020    ECHOCARDIOGRAM REPORT   Patient Name:   DESIA SABAN Date of Exam: 10/15/2020 Medical Rec #:  [id number]        Height:       62.0 in Accession #:    [id number]       Weight:       180.8 lb Date of Birth:  1945-08-20        BSA:          1.831 m Patient Age:    60 years         BP:           149/61 mmHg Patient Gender: F                HR:           62 bpm. Exam Location:  [facility] Procedure: 2D Echo, Cardiac Doppler and Color Doppler Indications:    CHF-Acute Diastolic  History:        Patient has prior history of Echocardiogram examinations, most                 recent 01/14/2016. CHF, CAD, Arrythmias:LBBB,                 Signs/Symptoms:Shortness of Breath; Risk Factors:Hypertension                 and Dyslipidemia.  Sonographer:    Wenda Low Referring Phys: [phone] DAVID TAT IMPRESSIONS  1. Compared to echo report from 2017, LVEF is down and wall motion changes are new.  2. There is hypokinesis of the lateral wall, distal anterior and akinesis of the distal inferior, distal inferolateral, distal inferoseptal and apical walls . Left  ventricular ejection fraction, by estimation, is 45%. The left ventricular internal cavity size was mildly dilated. There is mild left ventricular hypertrophy. Left ventricular diastolic parameters are consistent with Grade II diastolic dysfunction (pseudonormalization).  3. Right ventricular systolic function is low normal. The right ventricular size is normal. There is moderately elevated pulmonary artery systolic pressure.  4. Left atrial size was moderately dilated.  5. Mild mitral valve regurgitation.  6. The aortic valve is tricuspid. Aortic valve regurgitation is not visualized. Mild to moderate aortic valve sclerosis/calcification is present, without any evidence of aortic stenosis.  7. The inferior vena cava is dilated in size with >50% respiratory variability, suggesting right atrial pressure of 8 mmHg. FINDINGS  Left Ventricle: There is hypokinesis of the lateral wall, distal anterior and akinesis of the distal inferior, distal inferolateral, distal inferoseptal and apical walls. Left ventricular ejection fraction, by estimation, is 45%%. The left ventricle has  mildly  decreased function. The left ventricular internal cavity size was mildly dilated. There is mild left ventricular hypertrophy. Left ventricular diastolic parameters are consistent with Grade II diastolic dysfunction (pseudonormalization). Right Ventricle: The right ventricular size is normal. Right vetricular wall thickness was not assessed. Right ventricular systolic function is low normal. There is moderately elevated pulmonary artery systolic pressure. The tricuspid regurgitant velocity is 3.06 m/s, and with an assumed right atrial pressure of 8 mmHg, the estimated right ventricular systolic pressure is 96.2 mmHg. Left Atrium: Left atrial size was moderately dilated. Right Atrium: Right atrial size was normal in size. Pericardium: Trivial pericardial effusion is present. Mitral Valve: There is mild thickening of the mitral valve  leaflet(s). There is mild calcification of the mitral valve leaflet(s). Mild to moderate mitral annular calcification. Mild mitral valve regurgitation. MV peak gradient, 13.4 mmHg. The mean mitral valve gradient is 5.0 mmHg. Tricuspid Valve: The tricuspid valve is normal in structure. Tricuspid valve regurgitation is mild. Aortic Valve: The aortic valve is tricuspid. Aortic valve regurgitation is not visualized. Mild to moderate aortic valve sclerosis/calcification is present, without any evidence of aortic stenosis. Aortic valve mean gradient measures 7.0 mmHg. Aortic valve peak gradient measures 12.8 mmHg. Aortic valve area, by VTI measures 1.81 cm. Pulmonic Valve: The pulmonic valve was normal in structure. Pulmonic valve regurgitation is trivial. Aorta: The aortic root and ascending aorta are structurally normal, with no evidence of dilitation. Venous: The inferior vena cava is dilated in size with greater than 50% respiratory variability, suggesting right atrial pressure of 8 mmHg. IAS/Shunts: No atrial level shunt detected by color flow Doppler.  LEFT VENTRICLE PLAX 2D LVIDd:         5.28 cm      Diastology LVIDs:         4.00 cm      LV e' medial:    8.76 cm/s LV PW:         1.26 cm      LV E/e' medial:  18.7 LV IVS:        1.11 cm      LV e' lateral:   10.20 cm/s LVOT diam:     2.00 cm      LV E/e' lateral: 16.1 LV SV:         84 LV SV Index:   46 LVOT Area:     3.14 cm  LV Volumes (MOD) LV vol d, MOD A2C: 136.0 ml LV vol d, MOD A4C: 114.0 ml LV vol s, MOD A2C: 69.1 ml LV vol s, MOD A4C: 67.1 ml LV SV MOD A2C:     66.9 ml LV SV MOD A4C:     114.0 ml LV SV MOD BP:      61.6 ml RIGHT VENTRICLE RV Basal diam:  3.24 cm RV Mid diam:    2.50 cm RV S prime:     15.30 cm/s TAPSE (M-mode): 2.6 cm LEFT ATRIUM             Index       RIGHT ATRIUM           Index LA diam:        4.80 cm 2.62 cm/m  RA Area:     15.90 cm LA Vol (A2C):   93.6 ml 51.11 ml/m RA Volume:   40.90 ml  22.33 ml/m LA Vol (A4C):   88.3 ml  48.22 ml/m LA Biplane Vol: 96.4 ml 52.64 ml/m  AORTIC VALVE AV Area (Vmax):  1.90 cm AV Area (Vmean):   1.78 cm AV Area (VTI):     1.81 cm AV Vmax:           179.00 cm/s AV Vmean:          124.000 cm/s AV VTI:            0.463 m AV Peak Grad:      12.8 mmHg AV Mean Grad:      7.0 mmHg LVOT Vmax:         108.00 cm/s LVOT Vmean:        70.300 cm/s LVOT VTI:          0.267 m LVOT/AV VTI ratio: 0.58  AORTA Ao Root diam: 3.00 cm Ao Asc diam:  3.00 cm MITRAL VALVE                TRICUSPID VALVE MV Area (PHT): 3.91 cm     TR Peak grad:   37.5 mmHg MV Area VTI:   1.94 cm     TR Vmax:        306.00 cm/s MV Peak grad:  13.4 mmHg MV Mean grad:  5.0 mmHg     SHUNTS MV Vmax:       1.83 m/s     Systemic VTI:  0.27 m MV Vmean:      104.0 cm/s   Systemic Diam: 2.00 cm MV Decel Time: 194 msec MV E velocity: 164.00 cm/s MV A velocity: 102.00 cm/s MV E/A ratio:  1.61 Dorris Carnes MD Electronically signed by Dorris Carnes MD Signature Date/Time: 10/15/2020/9:18:51 PM    Final     Kathie Dike, MD  [facility]  If 7PM-7AM, please contact night-coverage [email or web address]  10/23/2020, 7:33 PM   LOS: 7 days

## 2020-10-23 NOTE — Anesthesia Preprocedure Evaluation (Signed)
Anesthesia Evaluation  Patient identified by MRN, date of birth, ID band Patient awake and Patient confused    Reviewed: Allergy & Precautions, NPO status , Patient's Chart, lab work & pertinent test results, reviewed documented beta blocker date and time   History of Anesthesia Complications Negative for: history of anesthetic complications  Airway Mallampati: II  TM Distance: >3 FB Neck ROM: Full    Dental  (+) Dental Advisory Given, Caps   Pulmonary neg pulmonary ROS,           Cardiovascular Exercise Tolerance: Poor hypertension, Pt. on medications and Pt. on home beta blockers + Past MI (high troponins, possible demand ischemia) and +CHF   Rhythm:Regular  1. Compared to echo report from 2017, LVEF is down and wall motion changes are new.  2. There is hypokinesis of the lateral wall, distal anterior and akinesis of the distal inferior, distal inferolateral, distal inferoseptal and apical walls . Left ventricular ejection fraction, by estimation, is 45%. The left ventricular internal  cavity size was mildly dilated. There is mild left ventricular  hypertrophy. Left ventricular diastolic parameters are consistent with Grade II diastolic dysfunction (pseudonormalization).  3. Right ventricular systolic function is low normal. The right  ventricular size is normal. There is moderately elevated pulmonary artery  systolic pressure.  4. Left atrial size was moderately dilated.  5. Mild mitral valve regurgitation.  6. The aortic valve is tricuspid. Aortic valve regurgitation is not visualized. Mild to moderate aortic valve sclerosis/calcification is  present, without any evidence of aortic stenosis.  7. The inferior vena cava is dilated in size with >50% respiratory variability, suggesting right atrial pressure of 8 mmHg.   15-Oct-2020 14:27:19 [facility]-ER ROUTINE RECORD [id number] (61 yr) Female  Caucasian Vent. rate 81 BPM PR interval 202 ms QRS duration 113 ms QT/QTcB 412/479 ms P-R-T axes 83 -36 89 Sinus rhythm Borderline IVCD with LAD Nonspecific repol abnormality, diffuse leads Confirmed by Gerlene Fee [phone]) on 10/17/2020 1:12:55 PM   Neuro/Psych negative neurological ROS     GI/Hepatic Neg liver ROS, GERD  Medicated,  Endo/Other  diabetes, Well Controlled, Type 2, Oral Hypoglycemic Agents  Renal/GU Renal InsufficiencyRenal disease     Musculoskeletal   Abdominal   Peds  Hematology  (+) anemia ,   Anesthesia Other Findings   Reproductive/Obstetrics                             Anesthesia Physical Anesthesia Plan  ASA: 4  Anesthesia Plan: General   Post-op Pain Management:    Induction: Intravenous  PONV Risk Score and Plan:   Airway Management Planned: Nasal Cannula, Natural Airway and Simple Face Mask  Additional Equipment:   Intra-op Plan:   Post-operative Plan:   Informed Consent: I have reviewed the patients History and Physical, chart, labs and discussed the procedure including the risks, benefits and alternatives for the proposed anesthesia with the patient or authorized representative who has indicated his/her understanding and acceptance.     Dental advisory given and Consent reviewed with POA  Plan Discussed with: CRNA and Surgeon  Anesthesia Plan Comments:         Anesthesia Quick Evaluation

## 2020-10-24 ENCOUNTER — Encounter (HOSPITAL_COMMUNITY): Payer: Self-pay | Admitting: Internal Medicine

## 2020-10-24 ENCOUNTER — Encounter (HOSPITAL_COMMUNITY)
Admission: EM | Disposition: A | Discharge status: Home / Self Care | Payer: Self-pay | Source: Home / Self Care | Attending: Internal Medicine

## 2020-10-24 ENCOUNTER — Encounter: Payer: Self-pay | Admitting: Internal Medicine

## 2020-10-24 HISTORY — PX: GIVENS CAPSULE STUDY: SHX5432

## 2020-10-24 LAB — CBC
HCT: 27.2 % — ABNORMAL LOW (ref 36.0–46.0)
Hemoglobin: 8.7 g/dL — ABNORMAL LOW (ref 12.0–15.0)
MCH: 29.9 pg (ref 26.0–34.0)
MCHC: 32 g/dL (ref 30.0–36.0)
MCV: 93.5 fL (ref 80.0–100.0)
Platelets: 277 10*3/uL (ref 150–400)
RBC: 2.91 MIL/uL — ABNORMAL LOW (ref 3.87–5.11)
RDW: 13.5 % (ref 11.5–15.5)
WBC: 7.9 10*3/uL (ref 4.0–10.5)
nRBC: 0 % (ref 0.0–0.2)

## 2020-10-24 LAB — BASIC METABOLIC PANEL
Anion gap: 7 (ref 5–15)
BUN: 44 mg/dL — ABNORMAL HIGH (ref 8–23)
CO2: 21 mmol/L — ABNORMAL LOW (ref 22–32)
Calcium: 8.3 mg/dL — ABNORMAL LOW (ref 8.9–10.3)
Chloride: 110 mmol/L (ref 98–111)
Creatinine, Ser: 2.62 mg/dL — ABNORMAL HIGH (ref 0.44–1.00)
GFR, Estimated: 18 mL/min — ABNORMAL LOW (ref >60)
Glucose, Bld: 83 mg/dL (ref 70–99)
Potassium: 3.9 mmol/L (ref 3.5–5.1)
Sodium: 138 mmol/L (ref 135–145)

## 2020-10-24 LAB — SURGICAL PATHOLOGY

## 2020-10-24 SURGERY — IMAGING PROCEDURE, GI TRACT, INTRALUMINAL, VIA CAPSULE

## 2020-10-24 NOTE — Progress Notes (Signed)
PROGRESS NOTE  Sabrina Wheeler IHK:[id number] DOB: 1945/04/20 DOA: 10/14/2020 PCP: [facility]  Brief History:  75 y.o. female with medical history of hypertension, diabetes mellitus, hyperlipidemia, CKD presenting with 2 to 3-day history of shortness of breath and chest pain.  The patient is a difficult historian at best.  She states that she has been having substernal chest pain and shortness of breath for the past 2 to 3 days.  She states that her chest pain occurs even at rest.  She states that she has chronic lower extremity edema but thinks that it is about the same as usual.  She has a nonproductive cough.  She denies any fevers, chills, headache, neck pain, nausea, vomiting or diarrhea.  She has not had any hemoptysis, hematochezia, melena.  The patient states that she normally sleeps in a recliner and has been doing so for the last 2 to 3 years.  She states that she has not taken any prescription medication for the past 3 years.  Apparently, the patient's primary care provider, the patient has not reestablished with any provider since then.  Upon EMS arrival, the patient was noted to have oxygen saturation 85% on room air. In the emergency department, the patient was afebrile and hemodynamically stable with oxygen saturation 90% on room air.  She was placed on 2 L with oxygen saturation up to 100%.  BMP shows sodium 138, potassium 4.8, serum creatinine 2.84.  WBC 13.0, hemoglobin 8.1, platelets 275,000.  Troponin 36>>102.  Chest x-ray showed bilateral patchy opacities.  She was started on IV heparin.  She had about 72 hours of IV heparin and remained stable after discontinuation.   Assessment/Plan:  Acute systolic and diastolic CHF -initially on IV furosemide 60 mg BID -8/10 PM--developed resp distress requiring NTG drip and additional lasix 80 mg  -8/11 AM--resp distress again requiring NTG drip -Daily weights -Accurate I's and O's -8/10 Echo--EF  45%,+HK lateral wall; +AK inf and inferolateral; G2DD, PASP 45.5 -d/c nitroglycerin drip 8/12 -hold diuretic 8/14 and 8/15 due to uptrending serum creatinine>>serum creatinine beginning to improve -restarted lasix 40 mg po daily on 10/22/20 -will need repeat BMP within one week on discharge--discussed with patient and daughter   Chest pain/elevated troponin -Difficult to ascertain whether it is atypical or typical -Troponins elevated secondary to demand ischemia from CHF -Cardiology consult appreciated -d/c IV heparin--she had >48 hours during this hospitalization -continue Aspirin  Acute Anemia/FOBT+ -Hgb steadily trending down since admission -GI consulted -Patient underwent EGD/colonoscopy on 8/18 which did not reveal any obvious source of blood loss -She underwent capsule endoscopy on 8/18, results are still pending -Discussed with GI and plan to keep patient n.p.o. after midnight in case any further procedures planned for a.m. 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 -Baseline creatinine 1.9-2.2 -Presented with serum creatinine 2.84 -Suspect patient may have progression of her underlying CKD -will need to tolerated worsen renal function for euvolemia and improved oxygenation -continue to hold diuretic due to uptrending serum creatinine -serum creatinine pleateaued at 3.75, since then trending down to 2.62 today -Renal function appears to be stable with oral Lasix   Essential hypertension -Had not  taken any antihypertensive medications for 3 years -Previously took amlodipine, carvedilol, lisinopril -Monitor with diuresis -d/c lisinopril due to CKD -restarted coreg -d/c amlodipine due to decrease EF    Hyperlipidemia -LDL 43 -Previously took Lipitor--restart   Diabetes mellitus type 2 -Was not previously taking any agents -Check hemoglobin A1c--5.9   Left pretibial wound -Wound care consult appreciated -not infected   Anemia of chronic disease -B12--239>>supplement -iron saturation 7% -ferritin 132 -supplement ferrtin IV and B12 IM x 1 -check FOBT--POSITIVE -d/c home with ferrous sulfate and B12 supplement -Follow-up hemoglobins have been stable        Status is: Inpatient  Remains inpatient appropriate because:Inpatient level of care appropriate due to severity of illness  Dispo: The patient is from: Home              Anticipated d/c is to: Home              Patient currently is not medically stable to d/c.   Difficult to place patient No        Family Communication:   Daughter updated at bedside 8/19  Consultants:  cardiology, GI  Code Status:  FULL   DVT Prophylaxis:  SCDs   Procedures: As Listed in Progress Note Above  Antibiotics: None        Subjective: She feels weak.  Denies any shortness of breath.  Anxious to discharge home.  Objective: Vitals:   10/24/20 0600 10/24/20 0815 10/24/20 1303 10/24/20 1634  BP:  (!) 151/54 132/65 (!) 166/50  Pulse:  65 69 60  Resp:  16 18 18   Temp:  98.2 F (36.8 C) 98 F (36.7 C)   TempSrc:  Oral Oral   SpO2:  97% 98% 99%  Weight: 75.2 kg     Height: 5\' 2"  (1.575 m)       Intake/Output Summary (Last 24 hours) at 10/24/2020 1840 Last data filed at 10/24/2020 1800 Gross per 24 hour  Intake 0 ml  Output 2365 ml  Net -2365 ml   Weight change: -0.3 kg Exam:  General exam: Alert, awake, oriented x 3 Respiratory system: Clear to auscultation. Respiratory effort normal. Cardiovascular system:RRR. No murmurs, rubs, gallops. Gastrointestinal system: Abdomen is nondistended, soft and nontender. No organomegaly or masses felt. Normal bowel sounds heard. Central nervous system: Alert and  oriented. No focal neurological deficits. Extremities: No C/C/E, +pedal pulses Skin: No rashes, lesions or ulcers Psychiatry: Judgement and insight appear normal. Mood & affect appropriate.      Data Reviewed: I have personally reviewed following labs and imaging studies Basic Metabolic Panel: Recent Labs  Lab 10/21/20 0400 10/22/20 0343 10/23/20 1159 10/23/20 1445 10/24/20 0504  NA 137 137 139 136 138  K 3.8 3.8 3.2* 3.9 3.9  CL 108 108 103 110 110  CO2 20* 23 29 21* 21*  GLUCOSE 107* 108* 105* 90 83  BUN 70* 63* 9 50* 44*  CREATININE 3.28* 3.06* 0.47 2.65* 2.62*  CALCIUM 8.3* 8.3* 8.6* 8.3* 8.3*   Liver Function Tests: No results for input(s): AST, ALT, ALKPHOS, BILITOT, PROT, ALBUMIN in the last 168 hours. No results for input(s): LIPASE, AMYLASE in the last 168 hours. No results for input(s): AMMONIA in the last 168 hours. Coagulation Profile: No results for input(s): INR, PROTIME in the last 168 hours. CBC: Recent Labs  Lab 10/20/20 0556 10/21/20 0400 10/22/20 0343 10/22/20 2157 10/23/20 0608 10/24/20 0504  WBC 7.3 7.8 8.9  --  8.6 7.9  HGB 7.0* 8.5* 8.5*  9.4* 9.1* 8.7*  HCT 23.1* 26.7* 27.2* 29.4* 28.3* 27.2*  MCV 96.3 92.4 93.2  --  93.4 93.5  PLT 245 283 307  --  287 277   Cardiac Enzymes: No results for input(s): CKTOTAL, CKMB, CKMBINDEX, TROPONINI in the last 168 hours. BNP: Invalid input(s): POCBNP CBG: Recent Labs  Lab 10/23/20 0720 10/23/20 0910  GLUCAP 87 94   HbA1C: No results for input(s): HGBA1C in the last 72 hours. Urine analysis:    Component Value Date/Time   COLORURINE STRAW (A) 10/16/2020 1439   APPEARANCEUR HAZY (A) 10/16/2020 1439   LABSPEC 1.006 10/16/2020 1439   PHURINE 5.0 10/16/2020 1439   GLUCOSEU 50 (A) 10/16/2020 1439   HGBUR NEGATIVE 10/16/2020 1439   BILIRUBINUR NEGATIVE 10/16/2020 1439   KETONESUR NEGATIVE 10/16/2020 1439   PROTEINUR 100 (A) 10/16/2020 1439   NITRITE NEGATIVE 10/16/2020 1439   LEUKOCYTESUR  NEGATIVE 10/16/2020 1439   Sepsis Labs: @LABRCNTIP (procalcitonin:4,lacticidven:4) ) Recent Results (from the past 240 hour(s))  Resp Panel by RT-PCR (Flu A&B, Covid) Nasopharyngeal Swab     Status: None   Collection Time: 10/15/20  3:40 AM   Specimen: Nasopharyngeal Swab; Nasopharyngeal(NP) swabs in vial transport medium  Result Value Ref Range Status   SARS Coronavirus 2 by RT PCR NEGATIVE NEGATIVE Final    Comment: (NOTE) SARS-CoV-2 target nucleic acids are NOT DETECTED.  The SARS-CoV-2 RNA is generally detectable in upper respiratory specimens during the acute phase of infection. The lowest concentration of SARS-CoV-2 viral copies this assay can detect is 138 copies/mL. A negative result does not preclude SARS-Cov-2 infection and should not be used as the sole basis for treatment or other patient management decisions. A negative result may occur with  improper specimen collection/handling, submission of specimen other than nasopharyngeal swab, presence of viral mutation(s) within the areas targeted by this assay, and inadequate number of viral copies(<138 copies/mL). A negative result must be combined with clinical observations, patient history, and epidemiological information. The expected result is Negative.  Fact Sheet for Patients:  EntrepreneurPulse.com.au  Fact Sheet for Healthcare Providers:  IncredibleEmployment.be  This test is no t yet approved or cleared by the [place] FDA and  has been authorized for detection and/or diagnosis of SARS-CoV-2 by FDA under an Emergency Use Authorization (EUA). This EUA will remain  in effect (meaning this test can be used) for the duration of the COVID-19 declaration under Section 564(b)(1) of the Act, 21 U.S.C.section 360bbb-3(b)(1), unless the authorization is terminated  or revoked sooner.       Influenza A by PCR NEGATIVE NEGATIVE Final   Influenza B by PCR NEGATIVE NEGATIVE Final     Comment: (NOTE) The Xpert Xpress SARS-CoV-2/FLU/RSV plus assay is intended as an aid in the diagnosis of influenza from Nasopharyngeal swab specimens and should not be used as a sole basis for treatment. Nasal washings and aspirates are unacceptable for Xpert Xpress SARS-CoV-2/FLU/RSV testing.  Fact Sheet for Patients: EntrepreneurPulse.com.au  Fact Sheet for Healthcare Providers: IncredibleEmployment.be  This test is not yet approved or cleared by the [place] FDA and has been authorized for detection and/or diagnosis of SARS-CoV-2 by FDA under an Emergency Use Authorization (EUA). This EUA will remain in effect (meaning this test can be used) for the duration of the COVID-19 declaration under Section 564(b)(1) of the Act, 21 U.S.C. section 360bbb-3(b)(1), unless the authorization is terminated or revoked.  Performed at [facility], [address] [place], [place] [address]   MRSA Next Gen by PCR,  Nasal     Status: None   Collection Time: 10/15/20  6:34 PM   Specimen: Nasal Mucosa; Nasal Swab  Result Value Ref Range Status   MRSA by PCR Next Gen NOT DETECTED NOT DETECTED Final    Comment: (NOTE) The GeneXpert MRSA Assay (FDA approved for NASAL specimens only), is one component of a comprehensive MRSA colonization surveillance program. It is not intended to diagnose MRSA infection nor to guide or monitor treatment for MRSA infections. Test performance is not FDA approved in patients less than 39 years old. Performed at [facility], [address], [place], [place] [address]   Culture, blood (Routine X 2) w Reflex to ID Panel     Status: None   Collection Time: 10/16/20  8:43 AM   Specimen: Right Antecubital; Blood  Result Value Ref Range Status   Specimen Description RIGHT ANTECUBITAL  Final   Special Requests   Final    BOTTLES DRAWN AEROBIC AND ANAEROBIC Blood Culture adequate volume   Culture   Final    NO GROWTH 5  DAYS Performed at [facility], [address], [place], [place] [address]    Report Status 10/21/2020 FINAL  Final  Culture, blood (Routine X 2) w Reflex to ID Panel     Status: None   Collection Time: 10/16/20  8:44 AM   Specimen: BLOOD RIGHT HAND  Result Value Ref Range Status   Specimen Description BLOOD RIGHT HAND  Final   Special Requests   Final    BOTTLES DRAWN AEROBIC AND ANAEROBIC Blood Culture adequate volume   Culture   Final    NO GROWTH 5 DAYS Performed at [facility], [address] [place], [place] [address]    Report Status 10/21/2020 FINAL  Final  Urine Culture     Status: Abnormal   Collection Time: 10/16/20  2:39 PM   Specimen: Urine, Clean Catch  Result Value Ref Range Status   Specimen Description   Final    URINE, CLEAN CATCH Performed at [facility], [address] [place],  [address]    Special Requests   Final    NONE Performed at [facility], [address], [place],  [address]    Culture MULTIPLE SPECIES PRESENT, SUGGEST RECOLLECTION (A)  Final   Report Status 10/18/2020 FINAL  Final     Scheduled Meds:  aspirin  325 mg Oral Daily   atorvastatin  80 mg Oral Daily   carvedilol  12.5 mg Oral BID WC   Chlorhexidine Gluconate Cloth  6 each Topical Daily   donepezil  5 mg Oral QHS   furosemide  40 mg Oral Daily   hydrALAZINE  25 mg Oral Q8H   nitroGLYCERIN  0.4 mg Transdermal Daily   pantoprazole  40 mg Oral BID   sodium chloride flush  3 mL Intravenous Q12H   vitamin B-12  500 mcg Oral Daily   Continuous Infusions:  sodium chloride      Procedures/Studies: DG Chest 2 View  Result Date: 10/15/2020 CLINICAL DATA:  Chest pain and shortness of breath EXAM: CHEST - 2 VIEW COMPARISON:  11/30/2018 FINDINGS: Cardiac shadow is enlarged but stable. Vascular congestion is noted with patchy airspace disease likely representing parenchymal edema. Small posterior effusions are noted. No pneumothorax is noted. No bony abnormality is seen.  IMPRESSION: Changes consistent with CHF with patchy parenchymal edema. Electronically Signed   By: Inez Catalina M.D.   On: 10/15/2020 00:50   DG CHEST PORT 1 VIEW  Result Date: 10/15/2020 CLINICAL DATA:  Intermittent chest pain and shortness of breath EXAM: PORTABLE CHEST 1 VIEW COMPARISON:  10/15/2020 12:20 a.m. FINDINGS: Unchanged enlarged cardiac silhouette. Redemonstrated vascular congestion and patchy airspace opacities. Trace pleural effusions. No pneumothorax. No acute osseous abnormality. IMPRESSION: Overall unchanged patchy parenchymal opacities, concerning for edema. Electronically Signed   By: Merilyn Baba MD   On: 10/15/2020 16:07   ECHOCARDIOGRAM COMPLETE  Result Date: 10/15/2020    ECHOCARDIOGRAM REPORT   Patient Name:   Sabrina Wheeler Date of Exam: 10/15/2020 Medical Rec #:  [id number]        Height:       62.0 in Accession #:    [id number]       Weight:       180.8 lb Date of Birth:  03-05-1946        BSA:          1.831 m Patient Age:    89 years         BP:           149/61 mmHg Patient Gender: F                HR:           62 bpm. Exam Location:  [facility] Procedure: 2D Echo, Cardiac Doppler and Color Doppler Indications:    CHF-Acute Diastolic  History:        Patient has prior history of Echocardiogram examinations, most                 recent 01/14/2016. CHF, CAD, Arrythmias:LBBB,                 Signs/Symptoms:Shortness of Breath; Risk Factors:Hypertension                 and Dyslipidemia.  Sonographer:    Wenda Low Referring Phys: [phone] DAVID TAT IMPRESSIONS  1. Compared to echo report from 2017, LVEF is down and wall motion changes are new.  2. There is hypokinesis of the lateral wall, distal anterior and akinesis of the distal inferior, distal inferolateral, distal inferoseptal and apical walls . Left ventricular ejection fraction, by estimation, is 45%. The left ventricular internal cavity size was mildly dilated. There is mild left ventricular hypertrophy. Left ventricular  diastolic parameters are consistent with Grade II diastolic dysfunction (pseudonormalization).  3. Right ventricular systolic function is low normal. The right ventricular size is normal. There is moderately elevated pulmonary artery systolic pressure.  4. Left atrial size was moderately dilated.  5. Mild mitral valve regurgitation.  6. The aortic valve is tricuspid. Aortic valve regurgitation is not visualized. Mild to moderate aortic valve sclerosis/calcification is present, without any evidence of aortic stenosis.  7. The inferior vena cava is dilated in size with >50% respiratory variability, suggesting right atrial pressure of 8 mmHg. FINDINGS  Left Ventricle: There is hypokinesis of the lateral wall, distal anterior and akinesis of the distal inferior, distal inferolateral, distal inferoseptal and apical walls. Left ventricular ejection fraction, by estimation, is 45%%. The left ventricle has  mildly decreased function. The left ventricular internal cavity size was mildly dilated. There is mild left ventricular hypertrophy. Left ventricular diastolic parameters are consistent with Grade II diastolic dysfunction (pseudonormalization). Right Ventricle: The right ventricular size is normal. Right vetricular wall thickness was not assessed. Right ventricular systolic function is low normal. There is moderately elevated pulmonary artery systolic pressure. The tricuspid regurgitant velocity is 3.06 m/s, and with an assumed  right atrial pressure of 8 mmHg, the estimated right ventricular systolic pressure is 43.1 mmHg. Left Atrium: Left atrial size was moderately dilated. Right Atrium: Right atrial size was normal in size. Pericardium: Trivial pericardial effusion is present. Mitral Valve: There is mild thickening of the mitral valve leaflet(s). There is mild calcification of the mitral valve leaflet(s). Mild to moderate mitral annular calcification. Mild mitral valve regurgitation. MV peak gradient, 13.4 mmHg. The  mean mitral valve gradient is 5.0 mmHg. Tricuspid Valve: The tricuspid valve is normal in structure. Tricuspid valve regurgitation is mild. Aortic Valve: The aortic valve is tricuspid. Aortic valve regurgitation is not visualized. Mild to moderate aortic valve sclerosis/calcification is present, without any evidence of aortic stenosis. Aortic valve mean gradient measures 7.0 mmHg. Aortic valve peak gradient measures 12.8 mmHg. Aortic valve area, by VTI measures 1.81 cm. Pulmonic Valve: The pulmonic valve was normal in structure. Pulmonic valve regurgitation is trivial. Aorta: The aortic root and ascending aorta are structurally normal, with no evidence of dilitation. Venous: The inferior vena cava is dilated in size with greater than 50% respiratory variability, suggesting right atrial pressure of 8 mmHg. IAS/Shunts: No atrial level shunt detected by color flow Doppler.  LEFT VENTRICLE PLAX 2D LVIDd:         5.28 cm      Diastology LVIDs:         4.00 cm      LV e' medial:    8.76 cm/s LV PW:         1.26 cm      LV E/e' medial:  18.7 LV IVS:        1.11 cm      LV e' lateral:   10.20 cm/s LVOT diam:     2.00 cm      LV E/e' lateral: 16.1 LV SV:         84 LV SV Index:   46 LVOT Area:     3.14 cm  LV Volumes (MOD) LV vol d, MOD A2C: 136.0 ml LV vol d, MOD A4C: 114.0 ml LV vol s, MOD A2C: 69.1 ml LV vol s, MOD A4C: 67.1 ml LV SV MOD A2C:     66.9 ml LV SV MOD A4C:     114.0 ml LV SV MOD BP:      61.6 ml RIGHT VENTRICLE RV Basal diam:  3.24 cm RV Mid diam:    2.50 cm RV S prime:     15.30 cm/s TAPSE (M-mode): 2.6 cm LEFT ATRIUM             Index       RIGHT ATRIUM           Index LA diam:        4.80 cm 2.62 cm/m  RA Area:     15.90 cm LA Vol (A2C):   93.6 ml 51.11 ml/m RA Volume:   40.90 ml  22.33 ml/m LA Vol (A4C):   88.3 ml 48.22 ml/m LA Biplane Vol: 96.4 ml 52.64 ml/m  AORTIC VALVE AV Area (Vmax):    1.90 cm AV Area (Vmean):   1.78 cm AV Area (VTI):     1.81 cm AV Vmax:           179.00 cm/s AV Vmean:           124.000 cm/s AV VTI:            0.463 m AV Peak Grad:      12.8  mmHg AV Mean Grad:      7.0 mmHg LVOT Vmax:         108.00 cm/s LVOT Vmean:        70.300 cm/s LVOT VTI:          0.267 m LVOT/AV VTI ratio: 0.58  AORTA Ao Root diam: 3.00 cm Ao Asc diam:  3.00 cm MITRAL VALVE                TRICUSPID VALVE MV Area (PHT): 3.91 cm     TR Peak grad:   37.5 mmHg MV Area VTI:   1.94 cm     TR Vmax:        306.00 cm/s MV Peak grad:  13.4 mmHg MV Mean grad:  5.0 mmHg     SHUNTS MV Vmax:       1.83 m/s     Systemic VTI:  0.27 m MV Vmean:      104.0 cm/s   Systemic Diam: 2.00 cm MV Decel Time: 194 msec MV E velocity: 164.00 cm/s MV A velocity: 102.00 cm/s MV E/A ratio:  1.61 Dorris Carnes MD Electronically signed by Dorris Carnes MD Signature Date/Time: 10/15/2020/9:18:51 PM    Final     Kathie Dike, MD  [facility]  If 7PM-7AM, please contact night-coverage [email or web address]  10/24/2020, 6:40 PM   LOS: 8 days

## 2020-10-24 NOTE — TOC Transition Note (Signed)
Transition of Care [facility]) - CM/SW Discharge Note   Patient Details  Name: Sabrina Wheeler MRN: [id number] Date of Birth: 1946-02-22  Transition of [facility]) CM/SW Contact:  Natasha Bence, LCSW Phone Number: 10/24/2020, 12:53 PM   Clinical Narrative:    CSW notified of patient's readiness for discharge. CSW notified Corene Cornea with [facility]. Corene Cornea  agreeable to begin  services upon d/c. TOC signing off.   Final next level of care: [place] Barriers to Discharge: Barriers Resolved   Patient Goals and CMS Choice Patient states their goals for this hospitalization and ongoing recovery are:: Home with [facility] CMS Medicare.gov Compare Post Acute Care list provided to:: Patient Choice offered to / list presented to : Patient  Discharge Placement                    Patient and family notified of of transfer: 10/24/20  Discharge Plan and Services                          HH Arranged: RN, PT [facility] Agency: [place] ([place]) Date [place]: 10/24/20 Time [place]: 1253 Representative spoke with at [place]: [place] ([place]) Interventions     Readmission Risk Interventions Readmission Risk Prevention Plan 10/16/2020  Transportation Screening Complete  Home Care Screening Complete  Medication Review (RN CM) Complete  Some recent data might be hidden

## 2020-10-25 DIAGNOSIS — I509 Heart failure, unspecified: Secondary | ICD-10-CM

## 2020-10-25 LAB — CBC
HCT: 27.3 % — ABNORMAL LOW (ref 36.0–46.0)
Hemoglobin: 8.7 g/dL — ABNORMAL LOW (ref 12.0–15.0)
MCH: 30 pg (ref 26.0–34.0)
MCHC: 31.9 g/dL (ref 30.0–36.0)
MCV: 94.1 fL (ref 80.0–100.0)
Platelets: 277 10*3/uL (ref 150–400)
RBC: 2.9 MIL/uL — ABNORMAL LOW (ref 3.87–5.11)
RDW: 13.6 % (ref 11.5–15.5)
WBC: 8.5 10*3/uL (ref 4.0–10.5)
nRBC: 0 % (ref 0.0–0.2)

## 2020-10-25 MED ORDER — HYDRALAZINE HCL 25 MG PO TABS: 25.0000 mg | ORAL_TABLET | Freq: Three times a day (TID) | ORAL | 1 refills | Status: AC

## 2020-10-25 MED ORDER — POLYETHYLENE GLYCOL 3350 17 G PO PACK: 17.0000 g | PACK | Freq: Every day | ORAL | 0 refills | Status: AC | PRN

## 2020-10-25 MED ORDER — ASPIRIN EC 81 MG PO TBEC: 81.0000 mg | DELAYED_RELEASE_TABLET | Freq: Every day | ORAL | 11 refills | Status: DC

## 2020-10-25 MED ORDER — CARVEDILOL 12.5 MG PO TABS: 12.5000 mg | ORAL_TABLET | Freq: Two times a day (BID) | ORAL | 1 refills | Status: AC

## 2020-10-25 MED ORDER — CYANOCOBALAMIN 500 MCG PO TABS: 500.0000 ug | ORAL_TABLET | Freq: Every day | ORAL | Status: AC

## 2020-10-25 MED ORDER — POLYETHYLENE GLYCOL 3350 17 G PO PACK: 17.0000 g | PACK | Freq: Every day | ORAL | 0 refills | Status: DC | PRN

## 2020-10-25 MED ORDER — ASPIRIN EC 81 MG PO TBEC: 81.0000 mg | DELAYED_RELEASE_TABLET | Freq: Every day | ORAL | 11 refills | Status: AC

## 2020-10-25 MED ORDER — FUROSEMIDE 40 MG PO TABS: 40.0000 mg | ORAL_TABLET | Freq: Every day | ORAL | 1 refills | Status: DC

## 2020-10-25 MED ORDER — FERROUS SULFATE 325 (65 FE) MG PO TBEC: 325.0000 mg | DELAYED_RELEASE_TABLET | Freq: Two times a day (BID) | ORAL | 3 refills | Status: AC

## 2020-10-25 MED ORDER — FERROUS SULFATE 325 (65 FE) MG PO TBEC: 325.0000 mg | DELAYED_RELEASE_TABLET | Freq: Two times a day (BID) | ORAL | 3 refills | Status: DC

## 2020-10-25 MED ORDER — PANTOPRAZOLE SODIUM 40 MG PO TBEC: 40.0000 mg | DELAYED_RELEASE_TABLET | Freq: Every day | ORAL | 1 refills | Status: AC

## 2020-10-25 MED ORDER — POLYETHYLENE GLYCOL 3350 17 G PO PACK: 17.0000 g | PACK | Freq: Every day | ORAL | Status: DC | PRN

## 2020-10-25 MED ORDER — PANTOPRAZOLE SODIUM 40 MG PO TBEC
40.0000 mg | DELAYED_RELEASE_TABLET | Freq: Every day | ORAL | 1 refills | Status: DC
Start: 2020-10-25 — End: 2020-10-25

## 2020-10-25 NOTE — Plan of Care (Signed)
  Problem: Activity: Goal: Risk for activity intolerance will decrease Outcome: Progressing   Problem: Safety: Goal: Ability to remain free from injury will improve Outcome: Progressing   Problem: Skin Integrity: Goal: Risk for impaired skin integrity will decrease Outcome: Progressing   Problem: Nutrition: Goal: Adequate nutrition will be maintained Outcome: Not Progressing NPO this am per provider orders. Discussed with patient and reminded her of nothing to eat or drink until seen by provider and he will discuss plan of care. Verbalized understanding. Notified NT as well to help with reminders if she asks for food/drink as she will likely require frequent reminders.

## 2020-10-25 NOTE — Progress Notes (Signed)
Discharge instructions reviewed with patient and daughter [name] at bedside by D. Tamala Julian, [facility]. Given AVS, prescriptions sent to correct pharmacy per Dr Roderic Palau and patient/daughter aware. IV sites removed, within normal limits. Verbalized understanding of instructions. Left unit in stable condition via w/c accompanied by nurse tech. Discharged home.

## 2020-10-25 NOTE — Plan of Care (Signed)
  Problem: Education: Goal: Knowledge of General Education information will improve Description: Including pain rating scale, medication(s)/side effects and non-pharmacologic comfort measures Outcome: Adequate for Discharge   Problem: Health Behavior/Discharge Planning: Goal: Ability to manage health-related needs will improve Outcome: Adequate for Discharge   Problem: Clinical Measurements: Goal: Ability to maintain clinical measurements within normal limits will improve Outcome: Adequate for Discharge Goal: Will remain free from infection Outcome: Adequate for Discharge Goal: Diagnostic test results will improve Outcome: Adequate for Discharge Goal: Respiratory complications will improve Outcome: Adequate for Discharge Goal: Cardiovascular complication will be avoided Outcome: Adequate for Discharge   Problem: Activity: Goal: Risk for activity intolerance will decrease 10/25/2020 1627 by Madie Reno, RN Outcome: Adequate for Discharge 10/25/2020 0756 by Madie Reno, RN Outcome: Progressing   Problem: Nutrition: Goal: Adequate nutrition will be maintained 10/25/2020 1627 by Madie Reno, RN Outcome: Adequate for Discharge 10/25/2020 0756 by Madie Reno, RN Outcome: Not Progressing   Problem: Coping: Goal: Level of anxiety will decrease Outcome: Adequate for Discharge   Problem: Elimination: Goal: Will not experience complications related to bowel motility Outcome: Adequate for Discharge Goal: Will not experience complications related to urinary retention Outcome: Adequate for Discharge   Problem: Pain Managment: Goal: General experience of comfort will improve Outcome: Adequate for Discharge   Problem: Safety: Goal: Ability to remain free from injury will improve 10/25/2020 1627 by Madie Reno, RN Outcome: Adequate for Discharge 10/25/2020 0756 by Madie Reno, RN Outcome: Progressing   Problem: Skin Integrity: Goal: Risk for impaired  skin integrity will decrease 10/25/2020 1627 by Madie Reno, RN Outcome: Adequate for Discharge 10/25/2020 0756 by Madie Reno, RN Outcome: Progressing

## 2020-10-25 NOTE — Progress Notes (Signed)
Katrinka Blazing, M.D. Gastroenterology & Hepatology   Interval History:  No acute events overnight. The patient reports feeling well, denies having any nausea, vomiting, fever, chills, melena, hematochezia, abdominal pain or distention. Has been tolerating diet adequately. Hemoglobin remained stable with most recent value of 8.7.  Inpatient Medications:  Current Facility-Administered Medications:    0.9 %  sodium chloride infusion, 250 mL, Intravenous, PRN, Rourk, Gerrit Friends, MD   acetaminophen (TYLENOL) tablet 650 mg, 650 mg, Oral, Q4H PRN, Jena Gauss, Gerrit Friends, MD   aspirin tablet 325 mg, 325 mg, Oral, Daily, Rourk, Gerrit Friends, MD, 325 mg at 10/24/20 1030   atorvastatin (LIPITOR) tablet 80 mg, 80 mg, Oral, Daily, Rourk, Gerrit Friends, MD, 80 mg at 10/24/20 1029   carvedilol (COREG) tablet 12.5 mg, 12.5 mg, Oral, BID WC, Rourk, Gerrit Friends, MD, 12.5 mg at 10/24/20 1757   Chlorhexidine Gluconate Cloth 2 % PADS 6 each, 6 each, Topical, Daily, Rourk, Gerrit Friends, MD, 6 each at 10/25/20 0907   donepezil (ARICEPT) tablet 5 mg, 5 mg, Oral, QHS, Rourk, Gerrit Friends, MD, 5 mg at 10/25/20 0020   furosemide (LASIX) tablet 40 mg, 40 mg, Oral, Daily, Rourk, Gerrit Friends, MD, 40 mg at 10/24/20 1028   hydrALAZINE (APRESOLINE) tablet 25 mg, 25 mg, Oral, Q8H, Rourk, Gerrit Friends, MD, 25 mg at 10/25/20 9753   nitroGLYCERIN (NITRODUR - Dosed in mg/24 hr) patch 0.4 mg, 0.4 mg, Transdermal, Daily, Rourk, Gerrit Friends, MD, 0.4 mg at 10/24/20 1031   ondansetron (ZOFRAN) injection 4 mg, 4 mg, Intravenous, Q6H PRN, Corbin Ade, MD, 4 mg at 10/17/20 0818   pantoprazole (PROTONIX) EC tablet 40 mg, 40 mg, Oral, BID, Rourk, Gerrit Friends, MD, 40 mg at 10/25/20 0020   sodium chloride flush (NS) 0.9 % injection 3 mL, 3 mL, Intravenous, Q12H, Rourk, Gerrit Friends, MD, 3 mL at 10/25/20 0910   sodium chloride flush (NS) 0.9 % injection 3 mL, 3 mL, Intravenous, PRN, Rourk, Gerrit Friends, MD   vitamin B-12 (CYANOCOBALAMIN) tablet 500 mcg, 500 mcg, Oral, Daily, Rourk,  Gerrit Friends, MD, 500 mcg at 10/24/20 1026   I/O    Intake/Output Summary (Last 24 hours) at 10/25/2020 1026 Last data filed at 10/25/2020 0910 Gross per 24 hour  Intake 243 ml  Output 2065 ml  Net -1822 ml     Physical Exam: Temp:  [97.6 F (36.4 C)-98.1 F (36.7 C)] 97.6 F (36.4 C) (08/20 0642) Pulse Rate:  [59-69] 61 (08/20 0642) Resp:  [18-20] 20 (08/19 2119) BP: (132-166)/(47-65) 166/49 (08/20 0642) SpO2:  [97 %-99 %] 98 % (08/20 0642)  Temp (24hrs), Avg:97.9 F (36.6 C), Min:97.6 F (36.4 C), Max:98.1 F (36.7 C) GENERAL: The patient is AO x3, in no acute distress. HEENT: Head is normocephalic and atraumatic. EOMI are intact. Mouth is well hydrated and without lesions. NECK: Supple. No masses LUNGS: Clear to auscultation. No presence of rhonchi/wheezing/rales. Adequate chest expansion HEART: RRR, normal s1 and s2. ABDOMEN: Soft, nontender, no guarding, no peritoneal signs, and nondistended. BS +. No masses. EXTREMITIES: Without any cyanosis, clubbing, rash, lesions or edema. NEUROLOGIC: AOx3, no focal motor deficit. SKIN: no jaundice, no rashes  Laboratory Data: CBC:     Component Value Date/Time   WBC 8.5 10/25/2020 0546   RBC 2.90 (L) 10/25/2020 0546   HGB 8.7 (L) 10/25/2020 0546   HCT 27.3 (L) 10/25/2020 0546   PLT 277 10/25/2020 0546   MCV 94.1 10/25/2020 0546   MCH 30.0 10/25/2020 0546  MCHC 31.9 10/25/2020 0546   RDW 13.6 10/25/2020 0546   LYMPHSABS 0.9 10/15/2020 0018   MONOABS 1.0 10/15/2020 0018   EOSABS 0.3 10/15/2020 0018   BASOSABS 0.1 10/15/2020 0018   COAG:  Lab Results  Component Value Date   INR 1.1 11/30/2018    BMP:  BMP Latest Ref Rng & Units 10/24/2020 10/23/2020 10/23/2020  Glucose 70 - 99 mg/dL 83 90 105(H)  BUN 8 - 23 mg/dL 44(H) 50(H) 9  Creatinine 0.44 - 1.00 mg/dL 2.62(H) 2.65(H) 0.47  Sodium 135 - 145 mmol/L 138 136 139  Potassium 3.5 - 5.1 mmol/L 3.9 3.9 3.2(L)  Chloride 98 - 111 mmol/L 110 110 103  CO2 22 - 32 mmol/L 21(L)  21(L) 29  Calcium 8.9 - 10.3 mg/dL 8.3(L) 8.3(L) 8.6(L)    HEPATIC:  Hepatic Function Latest Ref Rng & Units 06/29/2019 12/01/2018 11/30/2018  Total Protein 6.5 - 8.1 g/dL 7.7 5.7(L) 5.7(L)  Albumin 3.5 - 5.0 g/dL 2.9(L) 2.7(L) 2.8(L)  AST 15 - 41 U/L 34 19 20  ALT 0 - 44 U/L 35 18 19  Alk Phosphatase 38 - 126 U/L 156(H) 55 62  Total Bilirubin 0.3 - 1.2 mg/dL 0.8 0.5 0.7  Bilirubin, Direct 0.0 - 0.2 mg/dL - - 0.1    CARDIAC:  Lab Results  Component Value Date   CKTOTAL 63 11/30/2018      Imaging: I personally reviewed and interpreted the available labs, imaging and endoscopic files.   Assessment/Plan: 75 year old female with multiple comorbidities and past medical history of hypertension, diabetes, hyperlipidemia, CKD, who was admitted to the hospital after presenting heart failure exacerbation.  Gastroenterology was consulted for evaluation of iron deficiency anemia.  The patient did not have any episodes of upper gastrointestinal bleeding but required transfusion of 1 unit of PRBC during this admission.  Iron stores were low prior to transfusion.  Underwent EGD and colonoscopy on 10/23/2020.  Esophagogastroduodenospy showed presence of nonobstructive Schatzki's ring, small hiatal hernia.  Colonoscopy showed presence of 2 polyps in the descending colon between 2 to 5 mm which were removed (one was a tubular adenoma), presence of hemorrhoids and diverticulosis in the entire colon.  Due to this she underwent a capsule endoscopy which was within normal limits.  It is likely she may have some decreased absorption of iron leading to her anemia.  As her hemoglobin has remained stable, will recommend starting oral iron supplementation and follow-up with hematology as outpatient.  GI service will sign-off, please call us back if you have any more questions.  Maylon Peppers, MD Gastroenterology and Hepatology [facility]

## 2020-10-25 NOTE — Procedures (Signed)
Small Bowel Givens Capsule Study Procedure date:  10/25/2020  Referring Provider:  Kathie Dike, MD PCP:  Dr. Thera Flake [facility]  Indication for procedure:  iron deficiency anemia  Findings: Capsule reached the cecum.  Small bowel preparation was adequate.  No alterations were found throughout the gastric and small bowel lining.  No hematin or old blood was found.  First Gastric image:  00:00:32 First Duodenal image: 00:17:20 First Cecal image: 01:21:43 Gastric Passage time: 0h 36m Small Bowel Passage time:  1h 55m    Summary & Recommendations: No alterations found in the evaluation of the gastric and duodenal lining.  Iron deficiency likely related to decreased absorption of iron.  Will recommend starting oral iron supplementation and follow-up in hematology clinic.  - Discharge home on ferrous sulfate every day - Follow up in hematology clinic for chronic management of IDA - No gastrointestinal lesion explaining anemia, no need for further endoscopic evaluation  I personally communicated these recommendations to the patient  Maylon Peppers, MD Gastroenterology and Hepatology [facility]

## 2020-10-25 NOTE — Discharge Summary (Signed)
Physician Discharge Summary  Sabrina Wheeler:[id number] DOB: [birth year] DOA: 10/14/2020  PCP: [facility] date: 10/14/2020 Discharge date: 10/25/2020  Admitted From: Home Disposition: Home  Recommendations for Outpatient Follow-up:  Follow up with PCP in 1-2 weeks Please obtain BMP/CBC in one week Follow-up with cardiology in the next 2 weeks Consider outpatient referral to hematology for iron deficiency anemia  Home Health: Home health PT, RN Equipment/Devices:  Discharge Condition: Stable CODE STATUS: Full code Diet recommendation: Heart healthy  Brief/Interim Summary: 75 y.o. female with medical history of hypertension, diabetes mellitus, hyperlipidemia, CKD presenting with 2 to 3-day history of shortness of breath and chest pain.  The patient is a difficult historian at best.  She states that she has been having substernal chest pain and shortness of breath for the past 2 to 3 days.  She states that her chest pain occurs even at rest.  She states that she has chronic lower extremity edema but thinks that it is about the same as usual.  She has a nonproductive cough.  She denies any fevers, chills, headache, neck pain, nausea, vomiting or diarrhea.  She has not had any hemoptysis, hematochezia, melena.  The patient states that she normally sleeps in a recliner and has been doing so for the last 2 to 3 years.  She states that she has not taken any prescription medication for the past 3 years.  Apparently, the patient's primary care provider, the patient has not reestablished with any provider since then.  Upon EMS arrival, the patient was noted to have oxygen saturation 85% on room air. In the emergency department, the patient was afebrile and hemodynamically stable with oxygen saturation 90% on room air.  She was placed on 2 L with oxygen saturation up to 100%.  BMP shows sodium 138, potassium 4.8, serum creatinine 2.84.  WBC 13.0, hemoglobin 8.1,  platelets 275,000.  Troponin 36>>102.  Chest x-ray showed bilateral patchy opacities.  She was started on IV heparin.  She had about 72 hours of IV heparin and remained stable after discontinuation.   Discharge Diagnoses:  Active Problems:   Essential hypertension   Mixed hyperlipidemia   Chest pain   Acute kidney injury superimposed on chronic kidney disease (HCC)   Acute respiratory failure with hypoxia (HCC)   Systolic and diastolic CHF, acute (HCC)  Acute systolic and diastolic CHF -initially on IV furosemide 60 mg BID -8/10 PM--developed resp distress requiring NTG drip and additional lasix 80 mg  -8/11 AM--resp distress again requiring NTG drip -Daily weights -Accurate I's and O's -8/10 Echo--EF 45%,+HK lateral wall; +AK inf and inferolateral; G2DD, PASP 45.5 -d/c nitroglycerin drip 8/12 -hold diuretic 8/14 and 8/15 due to uptrending serum creatinine>>serum creatinine beginning to improve -restarted lasix 40 mg po daily on 10/22/20 -will need repeat BMP within one week on discharge--discussed with patient and daughter   Chest pain/elevated troponin -Difficult to ascertain whether it is atypical or typical -Troponins elevated secondary to demand ischemia from CHF -Cardiology consult appreciated -d/c IV heparin--she had >48 hours during this hospitalization -continue Aspirin   Acute Anemia/FOBT+ -Hgb steadily trending down since admission -GI consulted -Patient underwent EGD/colonoscopy on 8/18 which did not reveal any obvious source of blood loss -She underwent capsule endoscopy on 8/18, results are still pending -Discussed with GI and plan to keep patient n.p.o. after midnight in case any further procedures planned for a.m. 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 -Baseline creatinine 1.9-2.2 -Presented with serum creatinine 2.84 -Suspect patient may have progression of her underlying CKD -will need to tolerated worsen renal function for euvolemia and improved oxygenation -continue to hold diuretic due to uptrending serum creatinine -serum creatinine pleateaued at 3.75, since then trending down to 2.62 today -Renal function appears to be stable with oral Lasix   Essential hypertension -Had not taken any antihypertensive medications for 3 years -Previously took amlodipine, carvedilol, lisinopril -d/c lisinopril due to CKD -restarted coreg -d/c amlodipine due to decrease EF -Blood pressures have been stable    Hyperlipidemia -LDL 43 -Previously took Lipitor--restart   Diabetes mellitus type 2 -Was not previously taking any agents -Check hemoglobin A1c--5.9   Left pretibial wound -Wound care consult appreciated -not infected   Anemia of chronic disease -B12--239>>supplement -iron saturation 7% -ferritin 132 -supplement ferrtin IV and B12 IM x 1 -check FOBT--POSITIVE -d/c home with ferrous sulfate and B12 supplement -Follow-up hemoglobins have been stable  Discharge Instructions  Discharge Instructions     Diet - low sodium heart healthy   Complete by: As directed    Discharge wound care:   Complete by: As directed    Gently cleanse wound to LLE with soap and water, pat dry. Place a Xeroform gauze over the wound, top with an ABD pad. Beginning behind the toes and going to just below the knees, spiral wrap kerlex, then 4 inch ace wrap. Perform daily   Increase activity slowly   Complete by: As directed       Allergies as of 10/25/2020       Reactions   Macrobid [nitrofurantoin Monohyd Macro] Other (See Comments)   Pt states "it paralyzed me"   Sulfamethoxazole-trimethoprim Other (See Comments)   "paralyzed me"   Doxycycline Nausea And Vomiting    Clindamycin/lincomycin Rash        Medication List     STOP taking these medications    amLODipine 10 MG tablet Commonly known as: NORVASC   amoxicillin 500 MG capsule Commonly known as: AMOXIL   cephALEXin 250 MG capsule Commonly known as: KEFLEX   lisinopril 5 MG tablet Commonly known as: ZESTRIL   minocycline 100 MG capsule Commonly known as: Minocin   ondansetron 4 MG tablet Commonly known as: ZOFRAN       TAKE these medications    aspirin EC 81 MG tablet Take 1 tablet (81 mg total) by mouth daily. Swallow whole.   atorvastatin 80 MG tablet Commonly known as: LIPITOR Take 1 tablet by mouth daily.   Calcium Carb-Cholecalciferol 600-800 MG-UNIT Tabs Take 1 tablet by mouth at bedtime.   carvedilol 12.5 MG tablet Commonly known as: COREG Take 1 tablet (12.5 mg total) by mouth 2 (two) times daily with a meal. What changed:  medication strength how much to take   CINNAMON PO Take 1 tablet by mouth daily.   donepezil 5 MG tablet Commonly known as: ARICEPT Take 5 mg by mouth daily.   escitalopram 10 MG tablet Commonly known as: LEXAPRO Take 10 mg by mouth daily.   ferrous sulfate 325 (65 FE) MG EC tablet Take 1 tablet (325 mg total) by mouth 2 (two) times daily.   furosemide 40 MG tablet Commonly known as: LASIX Take 1 tablet (40  mg total) by mouth daily.   hydrALAZINE 25 MG tablet Commonly known as: APRESOLINE Take 1 tablet (25 mg total) by mouth every 8 (eight) hours.   multivitamin with minerals tablet Take 1 tablet by mouth daily.   pantoprazole 40 MG tablet Commonly known as: PROTONIX Take 1 tablet (40 mg total) by mouth daily.   polyethylene glycol 17 g packet Commonly known as: MIRALAX / GLYCOLAX Take 17 g by mouth daily as needed for mild constipation.   sodium bicarbonate 650 MG tablet Take 650 mg by mouth 2 (two) times daily.   triamcinolone ointment 0.1 % Commonly known as: KENALOG Apply 1 application topically 2 (two)  times daily.   TURMERIC PO Take 1 tablet by mouth daily.   vitamin B-12 500 MCG tablet Commonly known as: CYANOCOBALAMIN Take 1 tablet (500 mcg total) by mouth daily.   Vitamin C CR 1000 MG Tbcr Take 500 mg by mouth daily.               Discharge Care Instructions  (From admission, onward)           Start     Ordered   10/25/20 0000  Discharge wound care:       Comments: Gently cleanse wound to LLE with soap and water, pat dry. Place a Xeroform gauze over the wound, top with an ABD pad. Beginning behind the toes and going to just below the knees, spiral wrap kerlex, then 4 inch ace wrap. Perform daily   10/25/20 1505            Allergies  Allergen Reactions   Macrobid [Nitrofurantoin Monohyd Macro] Other (See Comments)    Pt states "it paralyzed me"   Sulfamethoxazole-Trimethoprim Other (See Comments)    "paralyzed me"   Doxycycline Nausea And Vomiting   Clindamycin/Lincomycin Rash    Consultations: Cardiology Gastroenterology   Procedures/Studies: DG Chest 2 View  Result Date: 10/15/2020 CLINICAL DATA:  Chest pain and shortness of breath EXAM: CHEST - 2 VIEW COMPARISON:  11/30/2018 FINDINGS: Cardiac shadow is enlarged but stable. Vascular congestion is noted with patchy airspace disease likely representing parenchymal edema. Small posterior effusions are noted. No pneumothorax is noted. No bony abnormality is seen. IMPRESSION: Changes consistent with CHF with patchy parenchymal edema. Electronically Signed   By: Inez Catalina M.D.   On: 10/15/2020 00:50   DG CHEST PORT 1 VIEW  Result Date: 10/15/2020 CLINICAL DATA:  Intermittent chest pain and shortness of breath EXAM: PORTABLE CHEST 1 VIEW COMPARISON:  10/15/2020 12:20 a.m. FINDINGS: Unchanged enlarged cardiac silhouette. Redemonstrated vascular congestion and patchy airspace opacities. Trace pleural effusions. No pneumothorax. No acute osseous abnormality. IMPRESSION: Overall unchanged patchy parenchymal  opacities, concerning for edema. Electronically Signed   By: Merilyn Baba MD   On: 10/15/2020 16:07   ECHOCARDIOGRAM COMPLETE  Result Date: 10/15/2020    ECHOCARDIOGRAM REPORT   Patient Name:   Sabrina Wheeler Date of Exam: 10/15/2020 Medical Rec #:  [id number]        Height:       62.0 in Accession #:    [id number]       Weight:       180.8 lb Date of Birth:  07-Feb-1946        BSA:          1.831 m Patient Age:    75 years         BP:           149/61 mmHg  Patient Gender: F                HR:           62 bpm. Exam Location:  [facility] Procedure: 2D Echo, Cardiac Doppler and Color Doppler Indications:    CHF-Acute Diastolic  History:        Patient has prior history of Echocardiogram examinations, most                 recent 01/14/2016. CHF, CAD, Arrythmias:LBBB,                 Signs/Symptoms:Shortness of Breath; Risk Factors:Hypertension                 and Dyslipidemia.  Sonographer:    Wenda Low Referring Phys: [phone] DAVID TAT IMPRESSIONS  1. Compared to echo report from 2017, LVEF is down and wall motion changes are new.  2. There is hypokinesis of the lateral wall, distal anterior and akinesis of the distal inferior, distal inferolateral, distal inferoseptal and apical walls . Left ventricular ejection fraction, by estimation, is 45%. The left ventricular internal cavity size was mildly dilated. There is mild left ventricular hypertrophy. Left ventricular diastolic parameters are consistent with Grade II diastolic dysfunction (pseudonormalization).  3. Right ventricular systolic function is low normal. The right ventricular size is normal. There is moderately elevated pulmonary artery systolic pressure.  4. Left atrial size was moderately dilated.  5. Mild mitral valve regurgitation.  6. The aortic valve is tricuspid. Aortic valve regurgitation is not visualized. Mild to moderate aortic valve sclerosis/calcification is present, without any evidence of aortic stenosis.  7. The inferior vena cava is  dilated in size with >50% respiratory variability, suggesting right atrial pressure of 8 mmHg. FINDINGS  Left Ventricle: There is hypokinesis of the lateral wall, distal anterior and akinesis of the distal inferior, distal inferolateral, distal inferoseptal and apical walls. Left ventricular ejection fraction, by estimation, is 45%%. The left ventricle has  mildly decreased function. The left ventricular internal cavity size was mildly dilated. There is mild left ventricular hypertrophy. Left ventricular diastolic parameters are consistent with Grade II diastolic dysfunction (pseudonormalization). Right Ventricle: The right ventricular size is normal. Right vetricular wall thickness was not assessed. Right ventricular systolic function is low normal. There is moderately elevated pulmonary artery systolic pressure. The tricuspid regurgitant velocity is 3.06 m/s, and with an assumed right atrial pressure of 8 mmHg, the estimated right ventricular systolic pressure is 37.6 mmHg. Left Atrium: Left atrial size was moderately dilated. Right Atrium: Right atrial size was normal in size. Pericardium: Trivial pericardial effusion is present. Mitral Valve: There is mild thickening of the mitral valve leaflet(s). There is mild calcification of the mitral valve leaflet(s). Mild to moderate mitral annular calcification. Mild mitral valve regurgitation. MV peak gradient, 13.4 mmHg. The mean mitral valve gradient is 5.0 mmHg. Tricuspid Valve: The tricuspid valve is normal in structure. Tricuspid valve regurgitation is mild. Aortic Valve: The aortic valve is tricuspid. Aortic valve regurgitation is not visualized. Mild to moderate aortic valve sclerosis/calcification is present, without any evidence of aortic stenosis. Aortic valve mean gradient measures 7.0 mmHg. Aortic valve peak gradient measures 12.8 mmHg. Aortic valve area, by VTI measures 1.81 cm. Pulmonic Valve: The pulmonic valve was normal in structure. Pulmonic valve  regurgitation is trivial. Aorta: The aortic root and ascending aorta are structurally normal, with no evidence of dilitation. Venous: The inferior vena cava is dilated in size with greater than 50%  respiratory variability, suggesting right atrial pressure of 8 mmHg. IAS/Shunts: No atrial level shunt detected by color flow Doppler.  LEFT VENTRICLE PLAX 2D LVIDd:         5.28 cm      Diastology LVIDs:         4.00 cm      LV e' medial:    8.76 cm/s LV PW:         1.26 cm      LV E/e' medial:  18.7 LV IVS:        1.11 cm      LV e' lateral:   10.20 cm/s LVOT diam:     2.00 cm      LV E/e' lateral: 16.1 LV SV:         84 LV SV Index:   46 LVOT Area:     3.14 cm  LV Volumes (MOD) LV vol d, MOD A2C: 136.0 ml LV vol d, MOD A4C: 114.0 ml LV vol s, MOD A2C: 69.1 ml LV vol s, MOD A4C: 67.1 ml LV SV MOD A2C:     66.9 ml LV SV MOD A4C:     114.0 ml LV SV MOD BP:      61.6 ml RIGHT VENTRICLE RV Basal diam:  3.24 cm RV Mid diam:    2.50 cm RV S prime:     15.30 cm/s TAPSE (M-mode): 2.6 cm LEFT ATRIUM             Index       RIGHT ATRIUM           Index LA diam:        4.80 cm 2.62 cm/m  RA Area:     15.90 cm LA Vol (A2C):   93.6 ml 51.11 ml/m RA Volume:   40.90 ml  22.33 ml/m LA Vol (A4C):   88.3 ml 48.22 ml/m LA Biplane Vol: 96.4 ml 52.64 ml/m  AORTIC VALVE AV Area (Vmax):    1.90 cm AV Area (Vmean):   1.78 cm AV Area (VTI):     1.81 cm AV Vmax:           179.00 cm/s AV Vmean:          124.000 cm/s AV VTI:            0.463 m AV Peak Grad:      12.8 mmHg AV Mean Grad:      7.0 mmHg LVOT Vmax:         108.00 cm/s LVOT Vmean:        70.300 cm/s LVOT VTI:          0.267 m LVOT/AV VTI ratio: 0.58  AORTA Ao Root diam: 3.00 cm Ao Asc diam:  3.00 cm MITRAL VALVE                TRICUSPID VALVE MV Area (PHT): 3.91 cm     TR Peak grad:   37.5 mmHg MV Area VTI:   1.94 cm     TR Vmax:        306.00 cm/s MV Peak grad:  13.4 mmHg MV Mean grad:  5.0 mmHg     SHUNTS MV Vmax:       1.83 m/s     Systemic VTI:  0.27 m MV Vmean:       104.0 cm/s   Systemic Diam: 2.00 cm MV Decel Time: 194 msec MV E velocity: 164.00 cm/s MV A velocity: 102.00 cm/s  MV E/A ratio:  1.61 Dorris Carnes MD Electronically signed by Dorris Carnes MD Signature Date/Time: 10/15/2020/9:18:51 PM    Final       Subjective: She denies any shortness of breath.  No nausea or vomiting.  Anxious to go home  Discharge Exam: Vitals:   10/24/20 1303 10/24/20 1634 10/24/20 2119 10/25/20 0642  BP: 132/65 (!) 166/50 (!) 133/47 (!) 166/49  Pulse: 69 60 (!) 59 61  Resp: 18 18 20    Temp: 98 F (36.7 C)  98.1 F (36.7 C) 97.6 F (36.4 C)  TempSrc: Oral  Oral   SpO2: 98% 99% 97% 98%  Weight:      Height:        General: Pt is alert, awake, not in acute distress Cardiovascular: RRR, S1/S2 +, no rubs, no gallops Respiratory: CTA bilaterally, no wheezing, no rhonchi Abdominal: Soft, NT, ND, bowel sounds + Extremities: no edema, no cyanosis    The results of significant diagnostics from this hospitalization (including imaging, microbiology, ancillary and laboratory) are listed below for reference.     Microbiology: Recent Results (from the past 240 hour(s))  MRSA Next Gen by PCR, Nasal     Status: None   Collection Time: 10/15/20  6:34 PM   Specimen: Nasal Mucosa; Nasal Swab  Result Value Ref Range Status   MRSA by PCR Next Gen NOT DETECTED NOT DETECTED Final    Comment: (NOTE) The GeneXpert MRSA Assay (FDA approved for NASAL specimens only), is one component of a comprehensive MRSA colonization surveillance program. It is not intended to diagnose MRSA infection nor to guide or monitor treatment for MRSA infections. Test performance is not FDA approved in patients less than 49 years old. Performed at [facility], [address], [place], [place] [address]   Culture, blood (Routine X 2) w Reflex to ID Panel     Status: None   Collection Time: 10/16/20  8:43 AM   Specimen: Right Antecubital; Blood  Result Value Ref Range Status   Specimen Description  RIGHT ANTECUBITAL  Final   Special Requests   Final    BOTTLES DRAWN AEROBIC AND ANAEROBIC Blood Culture adequate volume   Culture   Final    NO GROWTH 5 DAYS Performed at [facility], [address] [place], [place] [address]    Report Status 10/21/2020 FINAL  Final  Culture, blood (Routine X 2) w Reflex to ID Panel     Status: None   Collection Time: 10/16/20  8:44 AM   Specimen: BLOOD RIGHT HAND  Result Value Ref Range Status   Specimen Description BLOOD RIGHT HAND  Final   Special Requests   Final    BOTTLES DRAWN AEROBIC AND ANAEROBIC Blood Culture adequate volume   Culture   Final    NO GROWTH 5 DAYS Performed at [facility], [address] [place], [place] [address]    Report Status 10/21/2020 FINAL  Final  Urine Culture     Status: Abnormal   Collection Time: 10/16/20  2:39 PM   Specimen: Urine, Clean Catch  Result Value Ref Range Status   Specimen Description   Final    URINE, CLEAN CATCH Performed at [facility], [address] [place], [place] [address]    Special Requests   Final    NONE Performed at [facility], [address], [place], [place] [address]    Culture MULTIPLE SPECIES PRESENT, [place] (A)  Final   Report Status 10/18/2020 FINAL  Final  Labs: BNP (last 3 results) Recent Labs    10/15/20 0018  BNP 093.2*   Basic Metabolic Panel: Recent Labs  Lab 10/21/20 0400 10/22/20 0343 10/23/20 1159 10/23/20 1445 10/24/20 0504  NA 137 137 139 136 138  K 3.8 3.8 3.2* 3.9 3.9  CL 108 108 103 110 110  CO2 20* 23 29 21* 21*  GLUCOSE 107* 108* 105* 90 83  BUN 70* 63* 9 50* 44*  CREATININE 3.28* 3.06* 0.47 2.65* 2.62*  CALCIUM 8.3* 8.3* 8.6* 8.3* 8.3*   Liver Function Tests: No results for input(s): AST, ALT, ALKPHOS, BILITOT, PROT, ALBUMIN in the last 168 hours. No results for input(s): LIPASE, AMYLASE in the last 168 hours. No results for input(s): AMMONIA in the last 168 hours. CBC: Recent Labs  Lab 10/21/20 0400  10/22/20 0343 10/22/20 2157 10/23/20 0608 10/24/20 0504 10/25/20 0546  WBC 7.8 8.9  --  8.6 7.9 8.5  HGB 8.5* 8.5* 9.4* 9.1* 8.7* 8.7*  HCT 26.7* 27.2* 29.4* 28.3* 27.2* 27.3*  MCV 92.4 93.2  --  93.4 93.5 94.1  PLT 283 307  --  287 277 277   Cardiac Enzymes: No results for input(s): CKTOTAL, CKMB, CKMBINDEX, TROPONINI in the last 168 hours. BNP: Invalid input(s): POCBNP CBG: Recent Labs  Lab 10/23/20 0720 10/23/20 0910  GLUCAP 87 94   D-Dimer No results for input(s): DDIMER in the last 72 hours. Hgb A1c No results for input(s): HGBA1C in the last 72 hours. Lipid Profile No results for input(s): CHOL, HDL, LDLCALC, TRIG, CHOLHDL, LDLDIRECT in the last 72 hours. Thyroid function studies No results for input(s): TSH, T4TOTAL, T3FREE, THYROIDAB in the last 72 hours.  Invalid input(s): FREET3 Anemia work up No results for input(s): VITAMINB12, FOLATE, FERRITIN, TIBC, IRON, RETICCTPCT in the last 72 hours. Urinalysis    Component Value Date/Time   COLORURINE STRAW (A) 10/16/2020 1439   APPEARANCEUR HAZY (A) 10/16/2020 1439   LABSPEC 1.006 10/16/2020 1439   PHURINE 5.0 10/16/2020 1439   GLUCOSEU 50 (A) 10/16/2020 1439   HGBUR NEGATIVE 10/16/2020 1439   BILIRUBINUR NEGATIVE 10/16/2020 1439   KETONESUR NEGATIVE 10/16/2020 1439   PROTEINUR 100 (A) 10/16/2020 1439   NITRITE NEGATIVE 10/16/2020 1439   LEUKOCYTESUR NEGATIVE 10/16/2020 1439   Sepsis Labs Invalid input(s): PROCALCITONIN,  WBC,  LACTICIDVEN Microbiology Recent Results (from the past 240 hour(s))  MRSA Next Gen by PCR, Nasal     Status: None   Collection Time: 10/15/20  6:34 PM   Specimen: Nasal Mucosa; Nasal Swab  Result Value Ref Range Status   MRSA by PCR Next Gen NOT DETECTED NOT DETECTED Final    Comment: (NOTE) The GeneXpert MRSA Assay (FDA approved for NASAL specimens only), is one component of a comprehensive MRSA colonization surveillance program. It is not intended to diagnose MRSA infection  nor to guide or monitor treatment for MRSA infections. Test performance is not FDA approved in patients less than 57 years old. Performed at [facility], [address] [place], [place] [address]   Culture, blood (Routine X 2) w Reflex to ID Panel     Status: None   Collection Time: 10/16/20  8:43 AM   Specimen: Right Antecubital; Blood  Result Value Ref Range Status   Specimen Description RIGHT ANTECUBITAL  Final   Special Requests   Final    BOTTLES DRAWN AEROBIC AND ANAEROBIC Blood Culture adequate volume   Culture   Final    NO GROWTH 5 DAYS Performed at [facility], [address] [place], [place] [address]    Report Status 10/21/2020 FINAL  Final  Culture, blood (Routine X 2) w Reflex to ID Panel     Status: None   Collection Time: 10/16/20  8:44 AM   Specimen: BLOOD RIGHT HAND  Result Value Ref Range Status   Specimen Description BLOOD RIGHT HAND  Final   Special Requests   Final    BOTTLES DRAWN AEROBIC AND ANAEROBIC Blood Culture adequate volume   Culture   Final    NO GROWTH 5 DAYS Performed at [facility], [address], [place], [place] [address]    Report Status 10/21/2020 FINAL  Final  Urine Culture     Status: Abnormal   Collection Time: 10/16/20  2:39 PM   Specimen: Urine, Clean Catch  Result Value Ref Range Status   Specimen Description   Final    URINE, CLEAN CATCH Performed at [facility], [address] [place], [place] [address]    Special Requests   Final    NONE Performed at [facility], [address] [place], [place] [address]    Culture MULTIPLE SPECIES PRESENT, SUGGEST RECOLLECTION (A)  Final   Report Status 10/18/2020 FINAL  Final     Time coordinating discharge: 49mins  SIGNED:   Kathie Dike, MD  [facility] 10/25/2020, 3:07 PM   If 7PM-7AM, please contact night-coverage [email or web address]

## 2020-10-27 ENCOUNTER — Encounter (HOSPITAL_COMMUNITY): Payer: Self-pay | Admitting: Internal Medicine

## 2020-11-11 ENCOUNTER — Emergency Department (HOSPITAL_COMMUNITY): Payer: Medicare Other

## 2020-11-11 ENCOUNTER — Emergency Department (HOSPITAL_COMMUNITY)
Admission: EM | Admit: 2020-11-11 | Discharge: 2020-11-11 | Disposition: A | Discharge status: Home / Self Care | Payer: Medicare Other | Attending: Emergency Medicine | Admitting: Emergency Medicine

## 2020-11-11 ENCOUNTER — Other Ambulatory Visit: Payer: Self-pay

## 2020-11-11 ENCOUNTER — Encounter (HOSPITAL_COMMUNITY): Payer: Self-pay | Admitting: *Deleted

## 2020-11-11 DIAGNOSIS — Z7982 Long term (current) use of aspirin: Secondary | ICD-10-CM | POA: Insufficient documentation

## 2020-11-11 DIAGNOSIS — Z79899 Other long term (current) drug therapy: Secondary | ICD-10-CM | POA: Insufficient documentation

## 2020-11-11 DIAGNOSIS — N184 Chronic kidney disease, stage 4 (severe): Secondary | ICD-10-CM | POA: Insufficient documentation

## 2020-11-11 DIAGNOSIS — I130 Hypertensive heart and chronic kidney disease with heart failure and stage 1 through stage 4 chronic kidney disease, or unspecified chronic kidney disease: Secondary | ICD-10-CM | POA: Diagnosis not present

## 2020-11-11 DIAGNOSIS — R635 Abnormal weight gain: Secondary | ICD-10-CM | POA: Insufficient documentation

## 2020-11-11 DIAGNOSIS — I509 Heart failure, unspecified: Secondary | ICD-10-CM | POA: Insufficient documentation

## 2020-11-11 DIAGNOSIS — E1122 Type 2 diabetes mellitus with diabetic chronic kidney disease: Secondary | ICD-10-CM | POA: Insufficient documentation

## 2020-11-11 DIAGNOSIS — F0390 Unspecified dementia without behavioral disturbance: Secondary | ICD-10-CM | POA: Diagnosis not present

## 2020-11-11 LAB — CBC WITH DIFFERENTIAL/PLATELET
Abs Immature Granulocytes: 0.02 10*3/uL (ref 0.00–0.07)
Basophils Absolute: 0 10*3/uL (ref 0.0–0.1)
Basophils Relative: 1 %
Eosinophils Absolute: 0.4 10*3/uL (ref 0.0–0.5)
Eosinophils Relative: 5 %
HCT: 26.8 % — ABNORMAL LOW (ref 36.0–46.0)
Hemoglobin: 8.6 g/dL — ABNORMAL LOW (ref 12.0–15.0)
Immature Granulocytes: 0 %
Lymphocytes Relative: 14 %
Lymphs Abs: 1 10*3/uL (ref 0.7–4.0)
MCH: 30 pg (ref 26.0–34.0)
MCHC: 32.1 g/dL (ref 30.0–36.0)
MCV: 93.4 fL (ref 80.0–100.0)
Monocytes Absolute: 0.8 10*3/uL (ref 0.1–1.0)
Monocytes Relative: 11 %
Neutro Abs: 5.1 10*3/uL (ref 1.7–7.7)
Neutrophils Relative %: 69 %
Platelets: 213 10*3/uL (ref 150–400)
RBC: 2.87 MIL/uL — ABNORMAL LOW (ref 3.87–5.11)
RDW: 14.7 % (ref 11.5–15.5)
WBC: 7.3 10*3/uL (ref 4.0–10.5)
nRBC: 0 % (ref 0.0–0.2)

## 2020-11-11 LAB — BASIC METABOLIC PANEL
Anion gap: 6 (ref 5–15)
BUN: 41 mg/dL — ABNORMAL HIGH (ref 8–23)
CO2: 21 mmol/L — ABNORMAL LOW (ref 22–32)
Calcium: 8.2 mg/dL — ABNORMAL LOW (ref 8.9–10.3)
Chloride: 113 mmol/L — ABNORMAL HIGH (ref 98–111)
Creatinine, Ser: 2.6 mg/dL — ABNORMAL HIGH (ref 0.44–1.00)
GFR, Estimated: 19 mL/min — ABNORMAL LOW (ref >60)
Glucose, Bld: 128 mg/dL — ABNORMAL HIGH (ref 70–99)
Potassium: 3.8 mmol/L (ref 3.5–5.1)
Sodium: 140 mmol/L (ref 135–145)

## 2020-11-11 LAB — TROPONIN I (HIGH SENSITIVITY): Troponin I (High Sensitivity): 15 ng/L (ref <18)

## 2020-11-11 LAB — BRAIN NATRIURETIC PEPTIDE: B Natriuretic Peptide: 826 pg/mL — ABNORMAL HIGH (ref 0.0–100.0)

## 2020-11-11 NOTE — ED Provider Notes (Signed)
[facility] Provider Note  CSN: [id number] Arrival date & time: 11/11/20 1310    History No chief complaint on file.   Sabrina Wheeler is a 75 y.o. female with history of dementia recently in the hospital for uncontrolled HTN and CHF, requiring HTN drip and IV diuresis. She has been getting weighed at home every day, today home health noted that she had gained 4lb since Friday (4 days). She denies any CP or SOB. Daughter called PCP who recommended she come to the ED for evaluation. She has been taking her medications as prescribed. She lives with her husband who also has some dementia, they have an aide at the house every day for a few house, home health comes twice a week and daughter is there on the weekends. Daughter states the patient has been doing really well.    Past Medical History:  Diagnosis Date   Broken ankle 2008   left    CHF (congestive heart failure) (HCC)    Diabetes mellitus without complication ([place])    Hypertension    Obesity    Renal disorder    Stage 4 kidney disease    Past Surgical History:  Procedure Laterality Date   BILE DUCT STENT PLACEMENT     BIOPSY  10/23/2020   Procedure: BIOPSY;  Surgeon: Daneil Dolin, MD;  [facility];  Service: Endoscopy;;   COLONOSCOPY WITH PROPOFOL N/A 10/23/2020   Procedure: COLONOSCOPY WITH PROPOFOL;  Surgeon: Daneil Dolin, MD;  [facility];  Service: Endoscopy;  Laterality: N/A;   ESOPHAGOGASTRODUODENOSCOPY (EGD) WITH PROPOFOL N/A 10/23/2020   Procedure: ESOPHAGOGASTRODUODENOSCOPY (EGD) WITH PROPOFOL;  Surgeon: Daneil Dolin, MD;  [facility];  Service: Endoscopy;  Laterality: N/A;   GIVENS CAPSULE STUDY N/A 10/24/2020   Procedure: GIVENS CAPSULE STUDY;  Surgeon: Daneil Dolin, MD;  [facility];  Service: Endoscopy;  Laterality: N/A;   INCISIONAL HERNIA REPAIR     POLYPECTOMY  10/23/2020   Procedure: POLYPECTOMY;  Surgeon: Daneil Dolin, MD;   [facility];  Service: Endoscopy;;    Family History  Problem Relation Age of Onset   Cancer Mother    Heart disease Father    Heart attack Father     Social History   Tobacco Use   Smoking status: Never   Smokeless tobacco: Never  Vaping Use   Vaping Use: Never used  Substance Use Topics   Alcohol use: No   Drug use: No     Home Medications Prior to Admission medications   Medication Sig Start Date End Date Taking? Authorizing Provider  Ascorbic Acid (VITAMIN C CR) 1000 MG TBCR Take 500 mg by mouth daily. 06/13/20   [provider]  aspirin EC 81 MG tablet Take 1 tablet (81 mg total) by mouth daily. Swallow whole. 10/25/20   Kathie Dike, MD  atorvastatin (LIPITOR) 80 MG tablet Take 1 tablet by mouth daily. 09/25/18   [provider]  Calcium Carb-Cholecalciferol 600-800 MG-UNIT TABS Take 1 tablet by mouth at bedtime.    [provider]  carvedilol (COREG) 12.5 MG tablet Take 1 tablet (12.5 mg total) by mouth 2 (two) times daily with a meal. 10/25/20   Kathie Dike, MD  CINNAMON PO Take 1 tablet by mouth daily.    [provider]  donepezil (ARICEPT) 5 MG tablet Take 5 mg by mouth daily. 10/01/20   [provider]  escitalopram (LEXAPRO) 10  MG tablet Take 10 mg by mouth daily.    [provider]  ferrous sulfate 325 (65 FE) MG EC tablet Take 1 tablet (325 mg total) by mouth 2 (two) times daily. 10/25/20 10/25/21  Kathie Dike, MD  furosemide (LASIX) 40 MG tablet Take 1 tablet (40 mg total) by mouth daily. 10/25/20   Kathie Dike, MD  hydrALAZINE (APRESOLINE) 25 MG tablet Take 1 tablet (25 mg total) by mouth every 8 (eight) hours. 10/25/20   Kathie Dike, MD  Multiple Vitamins-Minerals (MULTIVITAMIN WITH MINERALS) tablet Take 1 tablet by mouth daily.    [provider]  pantoprazole (PROTONIX) 40 MG tablet Take 1 tablet (40 mg total) by mouth daily. 10/25/20   Kathie Dike, MD  polyethylene glycol  (MIRALAX / GLYCOLAX) 17 g packet Take 17 g by mouth daily as needed for mild constipation. 10/25/20   Kathie Dike, MD  sodium bicarbonate 650 MG tablet Take 650 mg by mouth 2 (two) times daily. 10/01/20   [provider]  triamcinolone ointment (KENALOG) 0.1 % Apply 1 application topically 2 (two) times daily.    [provider]  TURMERIC PO Take 1 tablet by mouth daily.    [provider]  vitamin B-12 (CYANOCOBALAMIN) 500 MCG tablet Take 1 tablet (500 mcg total) by mouth daily. 10/25/20   Kathie Dike, MD     Allergies    Macrobid [nitrofurantoin monohyd macro], Sulfamethoxazole-trimethoprim, Doxycycline, and Clindamycin/lincomycin   Review of Systems   Review of Systems A comprehensive review of systems was completed and negative except as noted in HPI.    Physical Exam BP (!) 142/47   Pulse 62   Temp 98.2 F (36.8 C) (Oral)   Resp 17   Wt 79 kg   SpO2 100%   BMI 31.86 kg/m   Physical Exam Vitals and nursing note reviewed.  Constitutional:      Appearance: Normal appearance.  HENT:     Head: Normocephalic and atraumatic.     Nose: Nose normal.     Mouth/Throat:     Mouth: Mucous membranes are moist.  Eyes:     Extraocular Movements: Extraocular movements intact.     Conjunctiva/sclera: Conjunctivae normal.  Cardiovascular:     Rate and Rhythm: Normal rate.  Pulmonary:     Effort: Pulmonary effort is normal.     Breath sounds: Normal breath sounds.  Abdominal:     General: Abdomen is flat.     Palpations: Abdomen is soft.     Tenderness: There is no abdominal tenderness.  Musculoskeletal:        General: No swelling. Normal range of motion.     Cervical back: Neck supple.  Skin:    General: Skin is warm and dry.  Neurological:     General: No focal deficit present.     Mental Status: She is alert.  Psychiatric:        Mood and Affect: Mood normal.     ED Results / Procedures / Treatments   Labs (all labs ordered are  listed, but only abnormal results are displayed) Labs Reviewed  BASIC METABOLIC PANEL - Abnormal; Notable for the following components:      Result Value   Chloride 113 (*)    CO2 21 (*)    Glucose, Bld 128 (*)    BUN 41 (*)    Creatinine, Ser 2.60 (*)    Calcium 8.2 (*)    GFR, Estimated 19 (*)    All other components  within normal limits  BRAIN NATRIURETIC PEPTIDE - Abnormal; Notable for the following components:   B Natriuretic Peptide 826.0 (*)    All other components within normal limits  CBC WITH DIFFERENTIAL/PLATELET - Abnormal; Notable for the following components:   RBC 2.87 (*)    Hemoglobin 8.6 (*)    HCT 26.8 (*)    All other components within normal limits  CBC WITH DIFFERENTIAL/PLATELET  TROPONIN I (HIGH SENSITIVITY)    EKG EKG Interpretation  Date/Time:  Tuesday November 11 2020 15:58:22 EDT Ventricular Rate:  64 PR Interval:  319 QRS Duration: 109 QT Interval:  479 QTC Calculation: 495 R Axis:   -17 Text Interpretation: Sinus rhythm Prolonged PR interval Borderline left axis deviation Low voltage, precordial leads Consider anterior infarct Borderline T abnormalities, inferior leads No significant change since last tracing Confirmed by Calvert Cantor [phone]) on 11/11/2020 4:17:25 PM  Radiology DG Chest 2 View  Result Date: 11/11/2020 CLINICAL DATA:  Recent weight gain, history of CHF EXAM: CHEST - 2 VIEW COMPARISON:  Chest radiograph 10/15/2020 FINDINGS: The heart is enlarged, unchanged. The mediastinal contours are stable. Patchy opacity seen throughout the lungs on the study of 10/15/2020 have improved. There is mild vascular congestion without overt pulmonary edema on the current study. There is no focal airspace disease. There is no significant pleural effusion. There is no pneumothorax. There is multilevel degenerative change of the thoracic spine with diffuse idiopathic skeletal hyperostosis. There is no acute osseous abnormality. IMPRESSION: 1. Overall  improved aeration compared to the study of 10/15/2020 favored to reflect resolved pulmonary edema. 2. No focal airspace disease or overt pulmonary edema on the current study. Electronically Signed   By: Valetta Mole M.D.   On: 11/11/2020 16:12    Procedures Procedures  Medications Ordered in the ED Medications - No data to display   MDM Rules/Calculators/A&P MDM Given recent complicated admission, will check labs and CXR for signs of acute fluid overload. She denies any complaints at the time of my evaluation.   ED Course  I have reviewed the triage vital signs and the nursing notes.  Pertinent labs & imaging results that were available during my care of the patient were reviewed by me and considered in my medical decision making (see chart for details).  Clinical Course as of 11/11/20 1728  Tue Nov 11, 2020  1617 CXR is clear.  [CS]  1655 CBC and BMP both at baseline.  [CS]  1656 Trop is neg.  [CS]  5638 BNP is moderately elevated compared to baseline in setting of CKD of unclear significance.  She remains asymptomatic in the ED. Discussed these results with patient and daughter at bedside. Advised to continue home meds, monitor weights, follow up with PCP and Cards and RTED for any other concerns.  [CS]    Clinical Course User Index [CS] Truddie Hidden, MD    Final Clinical Impression(s) / ED Diagnoses Final diagnoses:  Weight gain    Rx / DC Orders ED Discharge Orders     None        Truddie Hidden, MD 11/11/20 1728

## 2020-11-11 NOTE — ED Triage Notes (Signed)
C/o recent weight gain, history of CHF, DENIES PAIN OR SHORTNESS OF BREATH

## 2020-11-25 ENCOUNTER — Encounter (HOSPITAL_COMMUNITY): Payer: Self-pay | Admitting: Emergency Medicine

## 2020-11-25 ENCOUNTER — Other Ambulatory Visit: Payer: Self-pay

## 2020-11-25 ENCOUNTER — Inpatient Hospital Stay (HOSPITAL_COMMUNITY)
Admission: EM | Admit: 2020-11-25 | Discharge: 2020-11-28 | DRG: 291 | Disposition: A | Discharge status: Home Health | Payer: Medicare Other | Attending: Internal Medicine | Admitting: Internal Medicine

## 2020-11-25 ENCOUNTER — Emergency Department (HOSPITAL_COMMUNITY): Payer: Medicare Other

## 2020-11-25 DIAGNOSIS — E1122 Type 2 diabetes mellitus with diabetic chronic kidney disease: Secondary | ICD-10-CM | POA: Diagnosis present

## 2020-11-25 DIAGNOSIS — J9601 Acute respiratory failure with hypoxia: Secondary | ICD-10-CM | POA: Diagnosis present

## 2020-11-25 DIAGNOSIS — Z7982 Long term (current) use of aspirin: Secondary | ICD-10-CM | POA: Diagnosis not present

## 2020-11-25 DIAGNOSIS — Z20822 Contact with and (suspected) exposure to covid-19: Secondary | ICD-10-CM | POA: Diagnosis present

## 2020-11-25 DIAGNOSIS — Z9114 Patient's other noncompliance with medication regimen: Secondary | ICD-10-CM | POA: Diagnosis not present

## 2020-11-25 DIAGNOSIS — I130 Hypertensive heart and chronic kidney disease with heart failure and stage 1 through stage 4 chronic kidney disease, or unspecified chronic kidney disease: Principal | ICD-10-CM | POA: Diagnosis present

## 2020-11-25 DIAGNOSIS — Z8249 Family history of ischemic heart disease and other diseases of the circulatory system: Secondary | ICD-10-CM | POA: Diagnosis not present

## 2020-11-25 DIAGNOSIS — M7989 Other specified soft tissue disorders: Secondary | ICD-10-CM | POA: Diagnosis present

## 2020-11-25 DIAGNOSIS — E876 Hypokalemia: Secondary | ICD-10-CM | POA: Diagnosis not present

## 2020-11-25 DIAGNOSIS — I10 Essential (primary) hypertension: Secondary | ICD-10-CM | POA: Diagnosis not present

## 2020-11-25 DIAGNOSIS — L89312 Pressure ulcer of right buttock, stage 2: Secondary | ICD-10-CM | POA: Diagnosis present

## 2020-11-25 DIAGNOSIS — N184 Chronic kidney disease, stage 4 (severe): Secondary | ICD-10-CM | POA: Diagnosis present

## 2020-11-25 DIAGNOSIS — I2510 Atherosclerotic heart disease of native coronary artery without angina pectoris: Secondary | ICD-10-CM | POA: Diagnosis present

## 2020-11-25 DIAGNOSIS — E782 Mixed hyperlipidemia: Secondary | ICD-10-CM | POA: Diagnosis present

## 2020-11-25 DIAGNOSIS — D638 Anemia in other chronic diseases classified elsewhere: Secondary | ICD-10-CM | POA: Diagnosis present

## 2020-11-25 DIAGNOSIS — I5023 Acute on chronic systolic (congestive) heart failure: Secondary | ICD-10-CM

## 2020-11-25 DIAGNOSIS — Z8719 Personal history of other diseases of the digestive system: Secondary | ICD-10-CM

## 2020-11-25 DIAGNOSIS — I5043 Acute on chronic combined systolic (congestive) and diastolic (congestive) heart failure: Secondary | ICD-10-CM | POA: Diagnosis present

## 2020-11-25 DIAGNOSIS — Z881 Allergy status to other antibiotic agents status: Secondary | ICD-10-CM | POA: Diagnosis not present

## 2020-11-25 DIAGNOSIS — Z79899 Other long term (current) drug therapy: Secondary | ICD-10-CM | POA: Diagnosis not present

## 2020-11-25 DIAGNOSIS — Z882 Allergy status to sulfonamides status: Secondary | ICD-10-CM | POA: Diagnosis not present

## 2020-11-25 DIAGNOSIS — L89322 Pressure ulcer of left buttock, stage 2: Secondary | ICD-10-CM | POA: Diagnosis present

## 2020-11-25 LAB — COMPREHENSIVE METABOLIC PANEL
ALT: 18 U/L (ref 0–44)
AST: 25 U/L (ref 15–41)
Albumin: 3 g/dL — ABNORMAL LOW (ref 3.5–5.0)
Alkaline Phosphatase: 83 U/L (ref 38–126)
Anion gap: 5 (ref 5–15)
BUN: 36 mg/dL — ABNORMAL HIGH (ref 8–23)
CO2: 22 mmol/L (ref 22–32)
Calcium: 7.5 mg/dL — ABNORMAL LOW (ref 8.9–10.3)
Chloride: 110 mmol/L (ref 98–111)
Creatinine, Ser: 3.01 mg/dL — ABNORMAL HIGH (ref 0.44–1.00)
GFR, Estimated: 16 mL/min — ABNORMAL LOW (ref >60)
Glucose, Bld: 191 mg/dL — ABNORMAL HIGH (ref 70–99)
Potassium: 4.8 mmol/L (ref 3.5–5.1)
Sodium: 137 mmol/L (ref 135–145)
Total Bilirubin: 0.8 mg/dL (ref 0.3–1.2)
Total Protein: 6.5 g/dL (ref 6.5–8.1)

## 2020-11-25 LAB — CBC WITH DIFFERENTIAL/PLATELET
Abs Immature Granulocytes: 0.08 10*3/uL — ABNORMAL HIGH (ref 0.00–0.07)
Basophils Absolute: 0.1 10*3/uL (ref 0.0–0.1)
Basophils Relative: 0 %
Eosinophils Absolute: 0.3 10*3/uL (ref 0.0–0.5)
Eosinophils Relative: 2 %
HCT: 31.8 % — ABNORMAL LOW (ref 36.0–46.0)
Hemoglobin: 10.1 g/dL — ABNORMAL LOW (ref 12.0–15.0)
Immature Granulocytes: 1 %
Lymphocytes Relative: 7 %
Lymphs Abs: 0.9 10*3/uL (ref 0.7–4.0)
MCH: 30.1 pg (ref 26.0–34.0)
MCHC: 31.8 g/dL (ref 30.0–36.0)
MCV: 94.9 fL (ref 80.0–100.0)
Monocytes Absolute: 0.9 10*3/uL (ref 0.1–1.0)
Monocytes Relative: 7 %
Neutro Abs: 10.5 10*3/uL — ABNORMAL HIGH (ref 1.7–7.7)
Neutrophils Relative %: 83 %
Platelets: 249 10*3/uL (ref 150–400)
RBC: 3.35 MIL/uL — ABNORMAL LOW (ref 3.87–5.11)
RDW: 14.8 % (ref 11.5–15.5)
WBC: 12.7 10*3/uL — ABNORMAL HIGH (ref 4.0–10.5)
nRBC: 0 % (ref 0.0–0.2)

## 2020-11-25 LAB — PROTIME-INR
INR: 1.2 (ref 0.8–1.2)
Prothrombin Time: 14.9 seconds (ref 11.4–15.2)

## 2020-11-25 LAB — BLOOD GAS, ARTERIAL
Acid-base deficit: 6 mmol/L — ABNORMAL HIGH (ref 0.0–2.0)
Bicarbonate: 19.2 mmol/L — ABNORMAL LOW (ref 20.0–28.0)
FIO2: 60
O2 Saturation: 98.1 %
Patient temperature: 36.5
pCO2 arterial: 42.4 mmHg (ref 32.0–48.0)
pH, Arterial: 7.284 — ABNORMAL LOW (ref 7.350–7.450)
pO2, Arterial: 142 mmHg — ABNORMAL HIGH (ref 83.0–108.0)

## 2020-11-25 LAB — URINALYSIS, COMPLETE (UACMP) WITH MICROSCOPIC
Bacteria, UA: NONE SEEN
Bilirubin Urine: NEGATIVE
Glucose, UA: NEGATIVE mg/dL
Hgb urine dipstick: NEGATIVE
Ketones, ur: NEGATIVE mg/dL
Leukocytes,Ua: NEGATIVE
Nitrite: NEGATIVE
Protein, ur: 100 mg/dL — AB
Specific Gravity, Urine: 1.02 (ref 1.005–1.030)
pH: 5.5 (ref 5.0–8.0)

## 2020-11-25 LAB — RESP PANEL BY RT-PCR (FLU A&B, COVID) ARPGX2
Influenza A by PCR: NEGATIVE
Influenza B by PCR: NEGATIVE
SARS Coronavirus 2 by RT PCR: NEGATIVE

## 2020-11-25 LAB — BRAIN NATRIURETIC PEPTIDE: B Natriuretic Peptide: 674 pg/mL — ABNORMAL HIGH (ref 0.0–100.0)

## 2020-11-25 MED ORDER — DONEPEZIL HCL 5 MG PO TABS
5.0000 mg | ORAL_TABLET | Freq: Every day | ORAL | Status: DC
  Administered 2020-11-25 – 2020-11-28 (×4): 5 mg via ORAL
  Filled 2020-11-25 (×4): qty 1

## 2020-11-25 MED ORDER — SODIUM CHLORIDE 0.9% FLUSH: 3.0000 mL | INTRAVENOUS | Status: DC | PRN

## 2020-11-25 MED ORDER — SODIUM CHLORIDE 0.9 % IV SOLN: 250.0000 mL | INTRAVENOUS | Status: DC | PRN

## 2020-11-25 MED ORDER — CHLORHEXIDINE GLUCONATE CLOTH 2 % EX PADS
6.0000 | MEDICATED_PAD | Freq: Every day | CUTANEOUS | Status: DC
  Administered 2020-11-25 – 2020-11-28 (×4): 6 via TOPICAL

## 2020-11-25 MED ORDER — SODIUM CHLORIDE 0.9% FLUSH
3.0000 mL | Freq: Two times a day (BID) | INTRAVENOUS | Status: DC
  Administered 2020-11-25 – 2020-11-28 (×6): 3 mL via INTRAVENOUS

## 2020-11-25 MED ORDER — SODIUM BICARBONATE 650 MG PO TABS
650.0000 mg | ORAL_TABLET | Freq: Two times a day (BID) | ORAL | Status: DC
  Administered 2020-11-25 – 2020-11-28 (×7): 650 mg via ORAL
  Filled 2020-11-25 (×7): qty 1

## 2020-11-25 MED ORDER — CARVEDILOL 12.5 MG PO TABS
12.5000 mg | ORAL_TABLET | Freq: Two times a day (BID) | ORAL | Status: DC
  Administered 2020-11-25 – 2020-11-27 (×5): 12.5 mg via ORAL
  Filled 2020-11-25 (×6): qty 1

## 2020-11-25 MED ORDER — FUROSEMIDE 10 MG/ML IJ SOLN
60.0000 mg | Freq: Once | INTRAMUSCULAR | Status: AC
  Administered 2020-11-25: 60 mg via INTRAVENOUS
  Filled 2020-11-25: qty 6

## 2020-11-25 MED ORDER — VITAMIN B-12 100 MCG PO TABS
500.0000 ug | ORAL_TABLET | Freq: Every day | ORAL | Status: DC
  Administered 2020-11-25 – 2020-11-28 (×4): 500 ug via ORAL
  Filled 2020-11-25 (×2): qty 1
  Filled 2020-11-25 (×2): qty 5

## 2020-11-25 MED ORDER — PANTOPRAZOLE SODIUM 40 MG PO TBEC
40.0000 mg | DELAYED_RELEASE_TABLET | Freq: Every day | ORAL | Status: DC
  Administered 2020-11-25 – 2020-11-28 (×4): 40 mg via ORAL
  Filled 2020-11-25 (×4): qty 1

## 2020-11-25 MED ORDER — FUROSEMIDE 10 MG/ML IJ SOLN
80.0000 mg | Freq: Every day | INTRAMUSCULAR | Status: DC
  Administered 2020-11-26 – 2020-11-27 (×2): 80 mg via INTRAVENOUS
  Filled 2020-11-25 (×3): qty 8

## 2020-11-25 MED ORDER — ATORVASTATIN CALCIUM 40 MG PO TABS
80.0000 mg | ORAL_TABLET | Freq: Every day | ORAL | Status: DC
  Administered 2020-11-25 – 2020-11-27 (×3): 80 mg via ORAL
  Filled 2020-11-25 (×3): qty 2

## 2020-11-25 MED ORDER — FERROUS SULFATE 325 (65 FE) MG PO TABS
325.0000 mg | ORAL_TABLET | Freq: Two times a day (BID) | ORAL | Status: DC
  Administered 2020-11-25 – 2020-11-28 (×7): 325 mg via ORAL
  Filled 2020-11-25 (×7): qty 1

## 2020-11-25 MED ORDER — NITROGLYCERIN IN D5W 200-5 MCG/ML-% IV SOLN
5.0000 ug/min | INTRAVENOUS | Status: DC
  Administered 2020-11-25: 5 ug/min via INTRAVENOUS
  Filled 2020-11-25: qty 250

## 2020-11-25 MED ORDER — ASPIRIN EC 81 MG PO TBEC
81.0000 mg | DELAYED_RELEASE_TABLET | Freq: Every day | ORAL | Status: DC
  Administered 2020-11-25 – 2020-11-28 (×4): 81 mg via ORAL
  Filled 2020-11-25 (×4): qty 1

## 2020-11-25 MED ORDER — ACETAMINOPHEN 325 MG PO TABS: 650.0000 mg | ORAL_TABLET | ORAL | Status: DC | PRN

## 2020-11-25 MED ORDER — HEPARIN SODIUM (PORCINE) 5000 UNIT/ML IJ SOLN
5000.0000 [IU] | Freq: Three times a day (TID) | INTRAMUSCULAR | Status: DC
  Administered 2020-11-25 – 2020-11-28 (×9): 5000 [IU] via SUBCUTANEOUS
  Filled 2020-11-25 (×9): qty 1

## 2020-11-25 MED ORDER — ONDANSETRON HCL 4 MG/2ML IJ SOLN: 4.0000 mg | Freq: Four times a day (QID) | INTRAMUSCULAR | Status: DC | PRN

## 2020-11-25 MED ORDER — ESCITALOPRAM OXALATE 10 MG PO TABS
10.0000 mg | ORAL_TABLET | Freq: Every day | ORAL | Status: DC
  Administered 2020-11-25 – 2020-11-28 (×4): 10 mg via ORAL
  Filled 2020-11-25 (×4): qty 1

## 2020-11-25 NOTE — Progress Notes (Signed)
Talked to Daughter on phone, states mother has been hurting on left leg for several days and not been able to balance on the leg.Request X-ray of leg if possible. [name] Husband is bed bound and cannot help her but she does have aid that comes in every day to help most of daylight hours. States that mother may need rehab.

## 2020-11-25 NOTE — H&P (Signed)
History and Physical  Sabrina Wheeler MWU:[id number] DOB: 1946/02/03 DOA: 11/25/2020   PCP: [facility]   Patient coming from: Home  Chief Complaint: sob  HPI:  Sabrina Wheeler is a 75 y.o. female with medical history of systolic and diastolic CHF, of hypertension, diabetes mellitus, hyperlipidemia, CKD presenting with shortness of breath and respiratory distress.  History is limited secondary to the patient being in extremis.  Patient was recently admitted to the hospital from 10/14/2020 to 10/25/2020 secondary to respiratory failure from acute systolic and diastolic CHF.  She was discharged home with furosemide 40 mg daily.  During that hospitalization, the patient was noted to have a drop in her hemoglobin with positive FOBT.  EGD on 10/23/2020 showed nonobstructive Schatzki's ring with small hiatus hernia.  10/23/2020 colonoscopy showed pandiverticulosis with 2 descending colon polyps and nonbleeding internal hemorrhoids.  She was transfused 1 unit PRBC.  She was discharged home with home health.  Prior to hospitalization, the patient required BiPAP as well as nitroglycerin drip.  She was on IV heparin for 48 hours.  She was not felt to be a good candidate for heart catheterization secondary to her renal insufficiency. Apparently, there are concerns regarding the patient's compliance with her medications at home.  Upon EMS arrival, the patient was noted to have oxygen saturation of 65%.  She was placed on CPAP.  In the emergency department, the patient was placed on BiPAP.  She was given furosemide 60 mg IV x1.  Chest x-ray showed bilateral increased interstitial markings.  BMP showed sodium 137, serum creatinine 3.01, potassium 4.8.  LFTs were unremarkable.  WBC 12.7, hemoglobin 10.1, platelets 249,000.  BNP was 674.  EKG shows sinus rhythm with nonspecific T wave changes.  Assessment/Plan: Acute respiratory failure with hypoxia -Secondary to decompensated  CHF -Placed on BiPAP in the ED -Wean off BiPAP as tolerated  Acute on chronic diastolic CHF -7/25 Echo--EF 45%,+HK lateral wall; +AK inf and inferolateral; G2DD, PASP 45.5 -Continue IV furosemide -Accurate Daily I's and O's  CKD stage IV -Baseline creatinine 2.6-2.9 -Monitor with diuresis  Essential hypertension -Patient previously on amlodipine, carvedilol, lisinopril -Restart carvedilol  Hyperlipidemia -Restart statin  Anemia of chronic disease -Patient had a low B12 during her last hospital admission (239) -Iron saturation 7%, ferritin 132--she was given IV -10/23/2020 EGD and colonoscopy as discussed above  Diabetes mellitus type 2 -Not previously on any agents -10/15/20 Hemoglobin A1c 5.9        Past Medical History:  Diagnosis Date   Broken ankle 2008   left    CHF (congestive heart failure) ([place])    Diabetes mellitus without complication ([place])    Hypertension    Obesity    Renal disorder    Stage 4 kidney disease   Past Surgical History:  Procedure Laterality Date   BILE DUCT STENT PLACEMENT     BIOPSY  10/23/2020   Procedure: BIOPSY;  Surgeon: Daneil Dolin, MD;  [facility];  Service: Endoscopy;;   COLONOSCOPY WITH PROPOFOL N/A 10/23/2020   Procedure: COLONOSCOPY WITH PROPOFOL;  Surgeon: Daneil Dolin, MD;  [facility];  Service: Endoscopy;  Laterality: N/A;   ESOPHAGOGASTRODUODENOSCOPY (EGD) WITH PROPOFOL N/A 10/23/2020   Procedure: ESOPHAGOGASTRODUODENOSCOPY (EGD) WITH PROPOFOL;  Surgeon: Daneil Dolin, MD;  [facility];  Service: Endoscopy;  Laterality: N/A;   GIVENS CAPSULE STUDY N/A 10/24/2020   Procedure: GIVENS CAPSULE STUDY;  Surgeon: Gala Romney,  Cristopher Estimable, MD;  [facility];  Service: Endoscopy;  Laterality: N/A;   INCISIONAL HERNIA REPAIR     POLYPECTOMY  10/23/2020   Procedure: POLYPECTOMY;  Surgeon: Daneil Dolin, MD;  [facility];  Service: Endoscopy;;   Social History:  reports  that she has never smoked. She has never used smokeless tobacco. She reports that she does not drink alcohol and does not use drugs.   Family History  Problem Relation Age of Onset   Cancer Mother    Heart disease Father    Heart attack Father      Allergies  Allergen Reactions   Macrobid [Nitrofurantoin Monohyd Macro] Other (See Comments)    Pt states "it paralyzed me"   Sulfamethoxazole-Trimethoprim Other (See Comments)    "paralyzed me"   Doxycycline Nausea And Vomiting   Clindamycin/Lincomycin Rash     Prior to Admission medications   Medication Sig Start Date End Date Taking? Authorizing Provider  Ascorbic Acid (VITAMIN C CR) 1000 MG TBCR Take 500 mg by mouth daily. 06/13/20   [provider]  aspirin EC 81 MG tablet Take 1 tablet (81 mg total) by mouth daily. Swallow whole. 10/25/20   Kathie Dike, MD  atorvastatin (LIPITOR) 80 MG tablet Take 1 tablet by mouth daily. 09/25/18   [provider]  Calcium Carb-Cholecalciferol 600-800 MG-UNIT TABS Take 1 tablet by mouth at bedtime.    [provider]  carvedilol (COREG) 12.5 MG tablet Take 1 tablet (12.5 mg total) by mouth 2 (two) times daily with a meal. 10/25/20   Kathie Dike, MD  CINNAMON PO Take 1 tablet by mouth daily.    [provider]  donepezil (ARICEPT) 5 MG tablet Take 5 mg by mouth daily. 10/01/20   [provider]  escitalopram (LEXAPRO) 10 MG tablet Take 10 mg by mouth daily.    [provider]  ferrous sulfate 325 (65 FE) MG EC tablet Take 1 tablet (325 mg total) by mouth 2 (two) times daily. 10/25/20 10/25/21  Kathie Dike, MD  furosemide (LASIX) 40 MG tablet Take 1 tablet (40 mg total) by mouth daily. 10/25/20   Kathie Dike, MD  hydrALAZINE (APRESOLINE) 25 MG tablet Take 1 tablet (25 mg total) by mouth every 8 (eight) hours. 10/25/20   Kathie Dike, MD  Multiple Vitamins-Minerals (MULTIVITAMIN WITH MINERALS) tablet Take 1 tablet by mouth daily.     [provider]  pantoprazole (PROTONIX) 40 MG tablet Take 1 tablet (40 mg total) by mouth daily. 10/25/20   Kathie Dike, MD  polyethylene glycol (MIRALAX / GLYCOLAX) 17 g packet Take 17 g by mouth daily as needed for mild constipation. 10/25/20   Kathie Dike, MD  sodium bicarbonate 650 MG tablet Take 650 mg by mouth 2 (two) times daily. 10/01/20   [provider]  triamcinolone ointment (KENALOG) 0.1 % Apply 1 application topically 2 (two) times daily.    [provider]  TURMERIC PO Take 1 tablet by mouth daily.    [provider]  vitamin B-12 (CYANOCOBALAMIN) 500 MCG tablet Take 1 tablet (500 mcg total) by mouth daily. 10/25/20   Kathie Dike, MD    Review of Systems:  Unobtainable due to extremis  Physical Exam: Vitals:   11/25/20 0650 11/25/20 0700 11/25/20 0715 11/25/20 0720  BP: 131/62 (!) 148/64 139/67 (!) 146/64  Pulse: (!) 57 (!) 59 61 (!) 59  Resp: (!) 24 (!) 29 14 19   SpO2: 100% 100% 100% 100%  Height:       General:  A&O x 2, NAD, nontoxic, pleasant/cooperative Head/Eye: No conjunctival hemorrhage, no icterus, [place]/AT, No nystagmus ENT:  No icterus,  No thrush, good dentition, no pharyngeal exudate Neck:  No masses, no lymphadenpathy, no bruits CV:  RRR, no rub, no gallop, no S3 Lung:  bilateral crackles.  No wheeze Abdomen: soft/NT, +BS, nondistended, no peritoneal signs Ext: No cyanosis, No rashes, No petechiae, No lymphangitis, 1+ LE edema Neuro: CNII-XII intact, strength 4/5 in bilateral upper and lower extremities, no dysmetria  Labs on Admission:  Basic Metabolic Panel: Recent Labs  Lab 11/25/20 0611  NA 137  K 4.8  CL 110  CO2 22  GLUCOSE 191*  BUN 36*  CREATININE 3.01*  CALCIUM 7.5*   Liver Function Tests: Recent Labs  Lab 11/25/20 0611  AST 25  ALT 18  ALKPHOS 83  BILITOT 0.8  PROT 6.5  ALBUMIN 3.0*   No results for input(s): LIPASE, AMYLASE in the last 168 hours. No results for input(s): AMMONIA  in the last 168 hours. CBC: Recent Labs  Lab 11/25/20 0611  WBC 12.7*  NEUTROABS 10.5*  HGB 10.1*  HCT 31.8*  MCV 94.9  PLT 249   Coagulation Profile: Recent Labs  Lab 11/25/20 0611  INR 1.2   Cardiac Enzymes: No results for input(s): CKTOTAL, CKMB, CKMBINDEX, TROPONINI in the last 168 hours. BNP: Invalid input(s): POCBNP CBG: No results for input(s): GLUCAP in the last 168 hours. Urine analysis:    Component Value Date/Time   COLORURINE STRAW (A) 10/16/2020 1439   APPEARANCEUR HAZY (A) 10/16/2020 1439   LABSPEC 1.006 10/16/2020 1439   PHURINE 5.0 10/16/2020 1439   GLUCOSEU 50 (A) 10/16/2020 1439   HGBUR NEGATIVE 10/16/2020 1439   BILIRUBINUR NEGATIVE 10/16/2020 1439   KETONESUR NEGATIVE 10/16/2020 1439   PROTEINUR 100 (A) 10/16/2020 1439   NITRITE NEGATIVE 10/16/2020 1439   LEUKOCYTESUR NEGATIVE 10/16/2020 1439   Sepsis Labs: @LABRCNTIP (procalcitonin:4,lacticidven:4) )No results found for this or any previous visit (from the past 240 hour(s)).   Radiological Exams on Admission: DG Chest Portable 1 View  Result Date: 11/25/2020 CLINICAL DATA:  75 year old female with severe shortness of breath this morning. EXAM: PORTABLE CHEST 1 VIEW COMPARISON:  Chest radiographs 11/11/2020 and earlier. FINDINGS: Portable AP upright view at 0625 hours. Lower lung volumes. Stable mild cardiomegaly. Other mediastinal contours are within normal limits. Visualized tracheal air column is within normal limits. Increased bilateral basilar predominant indistinct pulmonary interstitial opacity. Increased blunting of the right costophrenic angle. No superimposed pneumothorax or consolidation. No acute osseous abnormality identified. Paucity of bowel gas in the upper abdomen. IMPRESSION: Mild cardiomegaly with increased pulmonary interstitial opacity and blunting of the right costophrenic angle from earlier this month. Favor acute interstitial edema with small right pleural effusion.  Viral/atypical respiratory infection felt less likely. Electronically Signed   By: Genevie Ann M.D.   On: 11/25/2020 06:52    EKG: Independently reviewed. Sinus, nonspecific ST change    Time spent:60 minutes Code Status:   FULL Family Communication:  No Family at bedside Disposition Plan: expect 2-3 day hospitalization Consults called: none DVT Prophylaxis: [place] Heparin     Orson Eva, DO  [facility] Pager [phone]  If 7PM-7AM, please contact night-coverage [email or web address] Password [facility] 11/25/2020, 7:37 AM

## 2020-11-25 NOTE — Progress Notes (Signed)
Patient taken off of BIPAP and placed on 3LNC. Patients sats are 97% and she is tolerating well at this time. BIPAP is at bedside if needed. Will continue to monitor as needed.

## 2020-11-25 NOTE — TOC Initial Note (Signed)
Transition of Care [facility]) - Initial/Assessment Note    Patient Details  Name: SHARNITA BOGUCKI MRN: [id number] Date of Birth: [birth year]  Transition of Care [facility]) CM/SW Contact:    Iona Beard, [place] Phone Number: 11/25/2020, 8:48 AM  Clinical Narrative:                 [facility] consulted for CHF screen. CSW completed screen and assessment with pts daughter Pasty Spillers. Pt lives with her husband. Pt has an aide in the home 4 hours a day and her daughter goes over on the weekends. Pt has transportation provided through [employer]. Pt is active with [facility] RN and PT. Pt has a cane and walker to use in the home.   Pt does weigh herself daily and her [facility] RN checks that she has been doing this when she comes in the home. Pt has been following a heart healthy diet. Pt takes all medications as prescribed. Pt's cardiologist is Dr. Harl Bowie. TOC to follow for d/c needs.   Expected Discharge Plan: [place] Barriers to Discharge: Continued Medical Work up   Patient Goals and CMS Choice Patient states their goals for this hospitalization and ongoing recovery are:: Home with [facility] CMS Medicare.gov Compare Post Acute Care list provided to:: Patient Represenative (must comment) Choice offered to / list presented to : Adult Children  Expected Discharge Plan and Services Expected Discharge Plan: [place] In-house Referral: Clinical Social Work Discharge Planning Services: CM Consult Post Acute Care Choice: [place] arrangements for the past 2 months: [place]                                      Prior Living Arrangements/Services Living arrangements for the past 2 months: Single Family Home Lives with:: Spouse Patient language and need for interpreter reviewed:: Yes Do you feel safe going back to the place where you live?: Yes      Need for Family Participation in Patient Care: Yes (Comment) Care  giver support system in place?: Yes (comment) Current home services: DME, Other (comment) (private aide) Criminal Activity/Legal Involvement Pertinent to Current Situation/Hospitalization: No - Comment as needed  Activities of Daily Living      Permission Sought/Granted                  Emotional Assessment Appearance:: Appears stated age Attitude/Demeanor/Rapport: Unable to Assess Affect (typically observed): Unable to Assess Orientation: : Fluctuating Orientation (Suspected and/or reported Sundowners) Alcohol / Substance Use: Not Applicable Psych Involvement: No (comment)  Admission diagnosis:  Acute on chronic combined systolic and diastolic CHF (congestive heart failure) (HCC) [I50.43] Patient Active Problem List   Diagnosis Date Noted   Acute on chronic combined systolic and diastolic CHF (congestive heart failure) ([place]) 11/25/2020   CKD (chronic kidney disease) stage 4, GFR 15-29 ml/min ([place]) 55/97/4163   Systolic and diastolic CHF, acute ([place]) 10/16/2020   Chest pain 10/15/2020   Acute kidney injury superimposed on chronic kidney disease ([place]) 10/15/2020   Acute respiratory failure with hypoxia ([place]) 10/15/2020   Anemia    Elevated troponin    Sepsis due to undetermined organism ([place]) 11/30/2018   Essential hypertension 11/30/2018   Mixed hyperlipidemia 11/30/2018   Cellulitis of lower extremity    Hypokalemia    Ischemic ulcer ([place]) 11/08/2013   Atherosclerosis of native  arteries of the extremities with ulceration(440.23) 11/08/2013   PCP:  [facility] [place]:   [facility] ([place]) [place], [place] [address] Phone: [phone] Fax: [phone]  [facility]  ([place], [place] [facility] [address] [place] [address] [place] [address] Phone: [phone] Fax: [phone]  [place] [address] [place] - [place] [address] [place] [address] [place] [address] Phone: [phone] Fax: [phone]  [place], [place] - [address] [place] [address] Phone: [phone] Fax: [phone]     Social Determinants of Health (SDOH) Interventions    Readmission Risk Interventions Readmission Risk Prevention Plan 11/25/2020 10/16/2020  Transportation Screening Complete Complete  Home Care Screening Complete Complete  Medication Review (RN CM) Complete Complete  Some recent data might be hidden

## 2020-11-25 NOTE — ED Triage Notes (Signed)
Per ems pt had fall earlier tonight and then developed sob a little over an hour ago. Pt was 65% on room air when they arrived. They placed pt on cpap and nitro paste.

## 2020-11-25 NOTE — ED Notes (Addendum)
Pt has not urinated since lasix administration; Stage 3 kidney disease

## 2020-11-25 NOTE — Progress Notes (Signed)
Check on patient during rounds.  Patient is currently on 3L [place] with sat of 98%, no distress is noted at this time.  Bipap remains at bedside if needed.  Will continue to monitor.

## 2020-11-25 NOTE — ED Provider Notes (Signed)
[facility] EMERGENCY DEPARTMENT Provider Note   CSN: [id number] Arrival date & time: 11/25/20  0555     History Chief Complaint  Patient presents with   Respiratory Distress    Sabrina Wheeler is a 75 y.o. female.  Level 5 caveat for respiratory distress.  Patient brought in by EMS with sudden onset difficulty breathing about 1 hour prior to arrival.  EMS reports initial sat was 65% on room air and they placed patient on CPAP and gave her Nitropaste.  EMS had seen her several hours before after a fall when her breathing was "fine".  Patient denies any chest pain, cough or fever.  Does have an apparent history of CHF and noncompliance with her medications.  Also history of hypertension and CKD.  She denies cough, fever, chest pain.  Has chronic leg swelling at baseline. Unable to give more of a history due to her respiratory distress  The history is provided by the patient and the EMS personnel. The history is limited by the condition of the patient.      Past Medical History:  Diagnosis Date   Broken ankle 2008   left    CHF (congestive heart failure) (HCC)    Diabetes mellitus without complication (HCC)    Hypertension    Obesity    Renal disorder    Stage 4 kidney disease    Patient Active Problem List   Diagnosis Date Noted   Systolic and diastolic CHF, acute ([place]) 10/16/2020   Chest pain 10/15/2020   Acute kidney injury superimposed on chronic kidney disease ([place]) 10/15/2020   Acute respiratory failure with hypoxia ([place]) 10/15/2020   Anemia    Elevated troponin    Sepsis due to undetermined organism ([place]) 11/30/2018   Essential hypertension 11/30/2018   Mixed hyperlipidemia 11/30/2018   Cellulitis of lower extremity    Hypokalemia    Ischemic ulcer ([place]) 11/08/2013   Atherosclerosis of native arteries of the extremities with ulceration(440.23) 11/08/2013    Past Surgical History:  Procedure Laterality Date   BILE DUCT STENT PLACEMENT     BIOPSY  10/23/2020    Procedure: BIOPSY;  Surgeon: Daneil Dolin, MD;  Location: [facility] END[facility];  Service: Endoscopy;;   COLONOSCOPY WITH PROPOFOL N/A 10/23/2020   Procedure: COLONOSCOPY WITH PROPOFOL;  Surgeon: Daneil Dolin, MD;  Location: [facility];  Service: Endoscopy;  Laterality: N/A;   ESOPHAGOGASTRODUODENOSCOPY (EGD) WITH PROPOFOL N/A 10/23/2020   Procedure: ESOPHAGOGASTRODUODENOSCOPY (EGD) WITH PROPOFOL;  Surgeon: Daneil Dolin, MD;  Location: [facility];  Service: Endoscopy;  Laterality: N/A;   GIVENS CAPSULE STUDY N/A 10/24/2020   Procedure: GIVENS CAPSULE STUDY;  Surgeon: Daneil Dolin, MD;  Location: [facility];  Service: Endoscopy;  Laterality: N/A;   INCISIONAL HERNIA REPAIR     POLYPECTOMY  10/23/2020   Procedure: POLYPECTOMY;  Surgeon: Daneil Dolin, MD;  Location: [facility];  Service: Endoscopy;;     OB History   No obstetric history on file.     Family History  Problem Relation Age of Onset   Cancer Mother    Heart disease Father    Heart attack Father     Social History   Tobacco Use   Smoking status: Never   Smokeless tobacco: Never  Vaping Use   Vaping Use: Never used  Substance Use Topics   Alcohol use: No   Drug use: No    Home Medications Prior to Admission medications   Medication Sig  Start Date End Date Taking? Authorizing Provider  Ascorbic Acid (VITAMIN C CR) 1000 MG TBCR Take 500 mg by mouth daily. 06/13/20   [provider]  aspirin EC 81 MG tablet Take 1 tablet (81 mg total) by mouth daily. Swallow whole. 10/25/20   Kathie Dike, MD  atorvastatin (LIPITOR) 80 MG tablet Take 1 tablet by mouth daily. 09/25/18   [provider]  Calcium Carb-Cholecalciferol 600-800 MG-UNIT TABS Take 1 tablet by mouth at bedtime.    [provider]  carvedilol (COREG) 12.5 MG tablet Take 1 tablet (12.5 mg total) by mouth 2 (two) times daily with a meal. 10/25/20   Kathie Dike, MD  CINNAMON PO Take 1 tablet by mouth daily.     [provider]  donepezil (ARICEPT) 5 MG tablet Take 5 mg by mouth daily. 10/01/20   [provider]  escitalopram (LEXAPRO) 10 MG tablet Take 10 mg by mouth daily.    [provider]  ferrous sulfate 325 (65 FE) MG EC tablet Take 1 tablet (325 mg total) by mouth 2 (two) times daily. 10/25/20 10/25/21  Kathie Dike, MD  furosemide (LASIX) 40 MG tablet Take 1 tablet (40 mg total) by mouth daily. 10/25/20   Kathie Dike, MD  hydrALAZINE (APRESOLINE) 25 MG tablet Take 1 tablet (25 mg total) by mouth every 8 (eight) hours. 10/25/20   Kathie Dike, MD  Multiple Vitamins-Minerals (MULTIVITAMIN WITH MINERALS) tablet Take 1 tablet by mouth daily.    [provider]  pantoprazole (PROTONIX) 40 MG tablet Take 1 tablet (40 mg total) by mouth daily. 10/25/20   Kathie Dike, MD  polyethylene glycol (MIRALAX / GLYCOLAX) 17 g packet Take 17 g by mouth daily as needed for mild constipation. 10/25/20   Kathie Dike, MD  sodium bicarbonate 650 MG tablet Take 650 mg by mouth 2 (two) times daily. 10/01/20   [provider]  triamcinolone ointment (KENALOG) 0.1 % Apply 1 application topically 2 (two) times daily.    [provider]  TURMERIC PO Take 1 tablet by mouth daily.    [provider]  vitamin B-12 (CYANOCOBALAMIN) 500 MCG tablet Take 1 tablet (500 mcg total) by mouth daily. 10/25/20   Kathie Dike, MD    Allergies    Macrobid [nitrofurantoin monohyd macro], Sulfamethoxazole-trimethoprim, Doxycycline, and Clindamycin/lincomycin  Review of Systems   Review of Systems  Unable to perform ROS: Severe respiratory distress   Physical Exam Updated Vital Signs BP (!) 145/62   Pulse 71   Resp (!) 24   Ht 5\' 2"  (1.575 m)   SpO2 100%   BMI 31.86 kg/m   Physical Exam Vitals and nursing note reviewed.  Constitutional:      General: She is in acute distress.     Appearance: She is well-developed. She is ill-appearing.  HENT:      Head: Normocephalic and atraumatic.     Mouth/Throat:     Pharynx: No oropharyngeal exudate.  Eyes:     Conjunctiva/sclera: Conjunctivae normal.     Pupils: Pupils are equal, round, and reactive to light.  Neck:     Comments: No meningismus. Cardiovascular:     Rate and Rhythm: Regular rhythm. Tachycardia present.     Heart sounds: Normal heart sounds. No murmur heard. Pulmonary:     Effort: Respiratory distress present.     Breath sounds: Rales present.     Comments: Moderate respiratory distress with crackles bilateral Abdominal:     Palpations: Abdomen is soft.  Tenderness: There is no abdominal tenderness. There is no guarding or rebound.  Musculoskeletal:        General: No tenderness. Normal range of motion.     Cervical back: Normal range of motion and neck supple.     Right lower leg: Edema present.     Left lower leg: Edema present.  Skin:    General: Skin is warm.  Neurological:     Mental Status: She is alert and oriented to person, place, and time.     Cranial Nerves: No cranial nerve deficit.     Motor: No abnormal muscle tone.     Coordination: Coordination normal.     Comments:  5/5 strength throughout. CN 2-12 intact.Equal grip strength.   Psychiatric:        Behavior: Behavior normal.    ED Results / Procedures / Treatments   Labs (all labs ordered are listed, but only abnormal results are displayed) Labs Reviewed  CBC WITH DIFFERENTIAL/PLATELET - Abnormal; Notable for the following components:      Result Value   WBC 12.7 (*)    RBC 3.35 (*)    Hemoglobin 10.1 (*)    HCT 31.8 (*)    Neutro Abs 10.5 (*)    Abs Immature Granulocytes 0.08 (*)    All other components within normal limits  COMPREHENSIVE METABOLIC PANEL - Abnormal; Notable for the following components:   Glucose, Bld 191 (*)    BUN 36 (*)    Creatinine, Ser 3.01 (*)    Calcium 7.5 (*)    Albumin 3.0 (*)    GFR, Estimated 16 (*)    All other components within normal limits  BRAIN  NATRIURETIC PEPTIDE - Abnormal; Notable for the following components:   B Natriuretic Peptide 674.0 (*)    All other components within normal limits  BLOOD GAS, ARTERIAL - Abnormal; Notable for the following components:   pH, Arterial 7.284 (*)    pO2, Arterial 142 (*)    Bicarbonate 19.2 (*)    Acid-base deficit 6.0 (*)    All other components within normal limits  RESP PANEL BY RT-PCR (FLU A&B, COVID) [id number]  PROTIME-INR    EKG EKG Interpretation  Date/Time:  Tuesday November 25 2020 06:03:32 EDT Ventricular Rate:  66 PR Interval:  67 QRS Duration: 122 QT Interval:  474 QTC Calculation: 497 R Axis:   -38 Text Interpretation: Sinus rhythm Atrial premature complex Short PR interval Nonspecific IVCD with LAD Probable anterior infarct, old No significant change was found Confirmed by Ezequiel Essex [phone]) on 11/25/2020 6:05:29 AM  Radiology DG Chest Portable 1 View  Result Date: 11/25/2020 CLINICAL DATA:  75 year old female with severe shortness of breath this morning. EXAM: PORTABLE CHEST 1 VIEW COMPARISON:  Chest radiographs 11/11/2020 and earlier. FINDINGS: Portable AP upright view at 0625 hours. Lower lung volumes. Stable mild cardiomegaly. Other mediastinal contours are within normal limits. Visualized tracheal air column is within normal limits. Increased bilateral basilar predominant indistinct pulmonary interstitial opacity. Increased blunting of the right costophrenic angle. No superimposed pneumothorax or consolidation. No acute osseous abnormality identified. Paucity of bowel gas in the upper abdomen. IMPRESSION: Mild cardiomegaly with increased pulmonary interstitial opacity and blunting of the right costophrenic angle from earlier this month. Favor acute interstitial edema with small right pleural effusion. Viral/atypical respiratory infection felt less likely. Electronically Signed   By: Genevie Ann M.D.   On: 11/25/2020 06:52    Procedures .Critical Care Performed by:  Ezequiel Essex, MD Authorized  by: Ezequiel Essex, MD   Critical care provider statement:    Critical care time (minutes):  45   Critical care was necessary to treat or prevent imminent or life-threatening deterioration of the following conditions:  Respiratory failure   Critical care was time spent personally by me on the following activities:  Discussions with consultants, evaluation of patient's response to treatment, examination of patient, ordering and performing treatments and interventions, ordering and review of laboratory studies, ordering and review of radiographic studies, pulse oximetry, re-evaluation of patient's condition, obtaining history from patient or surrogate and review of old charts   -8/10 Echo--EF 45%,+HK lateral wall; +AK inf and inferolateral; G2DD, PASP 45.5 Medications Ordered in ED Medications  furosemide (LASIX) injection 60 mg (has no administration in time range)  nitroGLYCERIN 50 mg in dextrose 5 % 250 mL (0.2 mg/mL) infusion (has no administration in time range)    ED Course  I have reviewed the triage vital signs and the nursing notes.  Pertinent labs & imaging results that were available during my care of the patient were reviewed by me and considered in my medical decision making (see chart for details).    MDM Rules/Calculators/A&P                          Sudden onset shortness of breath.  No chest pain, cough or fever.  EKG without acute ischemia.  Patient transition to BiPAP on arrival.  Will give IV Lasix as well as IV nitroglycerin  Patient with recent hospitalization for same last month.  She admits to noncompliance with her Lasix  X-ray consistent with CHF.  Labs show worsening of her creatinine to 3.0 from 2.6.  It was thought during her previous admission August that she had progression of her CKD with new baseline in the 2 range.  EKG without acute ischemia.  Troponin remains negative. Hemoglobin stable at 10.1.  Patient  comfortable on reassessment on BiPAP.  She will need admission for further diuresis.  Continue IV nitroglycerin drip and continue BiPAP support at this time.  Admission discussed with Dr. Carles Collet Final Clinical Impression(s) / ED Diagnoses Final diagnoses:  Acute respiratory failure with hypoxia ([place])  Acute on chronic systolic congestive heart failure [facility])    Rx / DC Orders ED Discharge Orders     None        Deva Ron, Annie Main, MD 11/25/20 [phone]

## 2020-11-25 NOTE — ED Notes (Signed)
Bladder scan: 668 cc Orson Eva MD notified  Orders: Place foley

## 2020-11-25 NOTE — Progress Notes (Signed)
Patient placed on full face bipap 16/8 60% and tolerating well at this time.  RT will continue to monitor.

## 2020-11-26 DIAGNOSIS — J9601 Acute respiratory failure with hypoxia: Secondary | ICD-10-CM

## 2020-11-26 DIAGNOSIS — N184 Chronic kidney disease, stage 4 (severe): Secondary | ICD-10-CM | POA: Diagnosis not present

## 2020-11-26 DIAGNOSIS — I10 Essential (primary) hypertension: Secondary | ICD-10-CM

## 2020-11-26 DIAGNOSIS — I5043 Acute on chronic combined systolic (congestive) and diastolic (congestive) heart failure: Secondary | ICD-10-CM

## 2020-11-26 LAB — BASIC METABOLIC PANEL
Anion gap: 8 (ref 5–15)
BUN: 38 mg/dL — ABNORMAL HIGH (ref 8–23)
CO2: 22 mmol/L (ref 22–32)
Calcium: 8 mg/dL — ABNORMAL LOW (ref 8.9–10.3)
Chloride: 109 mmol/L (ref 98–111)
Creatinine, Ser: 2.92 mg/dL — ABNORMAL HIGH (ref 0.44–1.00)
GFR, Estimated: 16 mL/min — ABNORMAL LOW (ref >60)
Glucose, Bld: 92 mg/dL (ref 70–99)
Potassium: 3.7 mmol/L (ref 3.5–5.1)
Sodium: 139 mmol/L (ref 135–145)

## 2020-11-26 LAB — MRSA NEXT GEN BY PCR, NASAL: MRSA by PCR Next Gen: NOT DETECTED

## 2020-11-26 LAB — URINE CULTURE: Culture: NO GROWTH

## 2020-11-26 MED ORDER — LABETALOL HCL 5 MG/ML IV SOLN: 10.0000 mg | INTRAVENOUS | Status: DC | PRN

## 2020-11-26 MED ORDER — HYDRALAZINE HCL 25 MG PO TABS
25.0000 mg | ORAL_TABLET | Freq: Three times a day (TID) | ORAL | Status: DC
  Administered 2020-11-26 – 2020-11-28 (×6): 25 mg via ORAL
  Filled 2020-11-26 (×6): qty 1

## 2020-11-26 NOTE — Progress Notes (Deleted)
Cardiology Office Note    Date:  11/26/2020   ID:  Sabrina Wheeler, DOB 28-May-1945, MRN [id number]   PCP:  [facility]  Cardiologist:  Carlyle Dolly, MD *** Advanced Practice Provider:  No care team member to display Electrophysiologist:  None   [id number]   No chief complaint on file.   History of Present Illness:  Sabrina Wheeler is a 75 y.o. female with history of chronic systolic/diastolic CHF, CKD IV, HTN, DM2 presents with worsening dyspnea O2 sat 65% requiring CPAP and now bipap. Crt 3.01, BNP 674.   She was seen by our staff 10/2020 for CHF and also had GI bleed requiring 1 unit PRBC. Endo nonobstructive Schatzki's ring and small HH, colon-pan diverticulosis, polyps and nonbleeding ulcers. She was diuresed and echo LVEF down 45% with new HK lateral/distal ant and AK distal inf/inferlat/inferoseptal/apical walls. No cath pursued b/c of CKD and lack of symptoms.but consider ischemic w/u in future if indicated.  Patient readmitted 11/25/2020 with recurrent CHF treated with IV diuretics.  Blood pressures were up and down and hydralazine was initially held but plan to use hydralazine and nitrates.  Avoid ACE/ARB/ARN I/Aldactone/SGLT2i.  Was felt her obstructive coronary disease/ischemia and advanced renal dysfunction led to worsening issues with clinical heart failure.  Given her advanced renal disease issues with anemia FOBT no plans for cardiac cath.      Past Medical History:  Diagnosis Date   Broken ankle 2008   left    CHF (congestive heart failure) (HCC)    Diabetes mellitus without complication ([place])    Hypertension    Obesity    Renal disorder    Stage 4 kidney disease    Past Surgical History:  Procedure Laterality Date   BILE DUCT STENT PLACEMENT     BIOPSY  10/23/2020   Procedure: BIOPSY;  Surgeon: Daneil Dolin, MD;  [facility];  Service: Endoscopy;;   COLONOSCOPY WITH  PROPOFOL N/A 10/23/2020   Procedure: COLONOSCOPY WITH PROPOFOL;  Surgeon: Daneil Dolin, MD;  [facility];  Service: Endoscopy;  Laterality: N/A;   ESOPHAGOGASTRODUODENOSCOPY (EGD) WITH PROPOFOL N/A 10/23/2020   Procedure: ESOPHAGOGASTRODUODENOSCOPY (EGD) WITH PROPOFOL;  Surgeon: Daneil Dolin, MD;  [facility];  Service: Endoscopy;  Laterality: N/A;   GIVENS CAPSULE STUDY N/A 10/24/2020   Procedure: GIVENS CAPSULE STUDY;  Surgeon: Daneil Dolin, MD;  [facility];  Service: Endoscopy;  Laterality: N/A;   INCISIONAL HERNIA REPAIR     POLYPECTOMY  10/23/2020   Procedure: POLYPECTOMY;  Surgeon: Daneil Dolin, MD;  [facility];  Service: Endoscopy;;    Current Medications: No outpatient medications have been marked as taking for the 12/02/20 encounter (Appointment) with Imogene Burn, PA-C.     Allergies:   Macrobid [nitrofurantoin monohyd macro], Sulfamethoxazole-trimethoprim, Doxycycline, and Clindamycin/lincomycin   Social History   Socioeconomic History   Marital status: Married    Spouse name: Not on file   Number of children: Not on file   Years of education: Not on file   Highest education level: Not on file  Occupational History   Not on file  Tobacco Use   Smoking status: Never   Smokeless tobacco: Never  Vaping Use   Vaping Use: Never used  Substance and Sexual Activity   Alcohol use: No   Drug use: No   Sexual activity: Not Currently  Other  Topics Concern   Not on file  Social History Narrative   Not on file   Social Determinants of Health   Financial Resource Strain: Not on file  Food Insecurity: Not on file  Transportation Needs: Not on file  Physical Activity: Not on file  Stress: Not on file  Social Connections: Not on file     Family History:  The patient's ***family history includes Cancer in her mother; Heart attack in her father; Heart disease in her father.   ROS:   Please see the history of  present illness.    ROS All other systems reviewed and are negative.   PHYSICAL EXAM:   VS:  There were no vitals taken for this visit.  Physical Exam  GEN: Well nourished, well developed, in no acute distress  HEENT: normal  Neck: no JVD, carotid bruits, or masses Cardiac:RRR; no murmurs, rubs, or gallops  Respiratory:  clear to auscultation bilaterally, normal work of breathing GI: soft, nontender, nondistended, + BS Ext: without cyanosis, clubbing, or edema, Good distal pulses bilaterally MS: no deformity or atrophy  Skin: warm and dry, no rash Neuro:  Alert and Oriented x 3, Strength and sensation are intact Psych: euthymic mood, full affect  Wt Readings from Last 3 Encounters:  11/26/20 175 lb 0.7 oz (79.4 kg)  11/11/20 174 lb 3.2 oz (79 kg)  10/24/20 165 lb 12.6 oz (75.2 kg)      Studies/Labs Reviewed:   EKG:  EKG is*** ordered today.  The ekg ordered today demonstrates ***  Recent Labs: 10/17/2020: Magnesium 1.6 11/25/2020: ALT 18; B Natriuretic Peptide 674.0; Hemoglobin 10.1; Platelets 249 11/26/2020: BUN 38; Creatinine, Ser 2.92; Potassium 3.7; Sodium 139   Lipid Panel    Component Value Date/Time   CHOL 85 10/15/2020 0243   TRIG 29 10/15/2020 0243   HDL 36 (L) 10/15/2020 0243   CHOLHDL 2.4 10/15/2020 0243   VLDL 6 10/15/2020 0243   LDLCALC 43 10/15/2020 0243    Additional studies/ records that were reviewed today include:    IMPRESSIONS     1. Compared to echo report from 2017, LVEF is down and wall motion  changes are new.   2. There is hypokinesis of the lateral wall, distal anterior and akinesis  of the distal inferior, distal inferolateral, distal inferoseptal and  apical walls . Left ventricular ejection fraction, by estimation, is 45%.  The left ventricular internal  cavity size was mildly dilated. There is mild left ventricular  hypertrophy. Left ventricular diastolic parameters are consistent with  Grade II diastolic dysfunction  (pseudonormalization).   3. Right ventricular systolic function is low normal. The right  ventricular size is normal. There is moderately elevated pulmonary artery  systolic pressure.   4. Left atrial size was moderately dilated.   5. Mild mitral valve regurgitation.   6. The aortic valve is tricuspid. Aortic valve regurgitation is not  visualized. Mild to moderate aortic valve sclerosis/calcification is  present, without any evidence of aortic stenosis.   7. The inferior vena cava is dilated in size with >50% respiratory  variability, suggesting right atrial pressure of 8 mmHg.   Risk Assessment/Calculations:   {Does this patient have ATRIAL FIBRILLATION?:[phone]}     ASSESSMENT:    No diagnosis found.   PLAN:  In order of problems listed above:  chronic systolic and diastolic CHF LVEF 28% with WMA on echo 10/2020-no cath done at that time with CKD, diuresed 1.1L so far, Crt 2.92 today on Lasix 80  mg daily, coreg 12.5 bid(was on hydralazine 25 mg q8 PTA-BP trending high-should restart)  CAD DES LAD 12/2014 [facility] with new LVD and WMA but no plans for cath with worsening renal function and FOBT.   CKD stage IV Crt 2.92 came down some with diuresis   HTN-trending high-hydralazine on hold   HLD on lipitor 80 mg    Anemia S/P transfusion, endo/colonoscopy 10/2020   DM2 per primary team  Shared Decision Making/Informed Consent   {Are you ordering a CV Procedure (e.g. stress test, cath, DCCV, TEE, etc)?   Press F2        :[id number]}    Medication Adjustments/Labs and Tests Ordered: Current medicines are reviewed at length with the patient today.  Concerns regarding medicines are outlined above.  Medication changes, Labs and Tests ordered today are listed in the Patient Instructions below. There are no Patient Instructions on file for this visit.   Signed, Ermalinda Barrios, PA-C  11/26/2020 1:02 PM    [facility] [place], [place], [place]   [address] Phone: [phone]; Fax: [phone]

## 2020-11-26 NOTE — Consult Note (Addendum)
Cardiology Consultation:   Patient ID: Sabrina Wheeler MRN: [id number]; DOB: 12-Jan-1946  Admit date: 11/25/2020 Date of Consult: 11/26/2020  PCP:  [facility] Cardiologist:  Carlyle Dolly, MD        Patient Profile:   Sabrina Wheeler is a 75 y.o. female with a hx of systolic and diastolic CHF who is being seen 11/26/2020 for the evaluation of recurrent CHF at the request of Dr. Manuella Ghazi.  History of Present Illness:   Sabrina Wheeler is a 75 yo female patient with history of chronic systolic/diastolic CHF, CKD IV, HTN, DM2 presents with worsening dyspnea O2 sat 65% requiring CPAP and now bipap. Crt 3.01, BNP 674.  She was seen by our staff 10/2020 for CHF and also had GI bleed requiring 1 unit PRBC. Endo nonobstructive Schatzki's ring and small HH, colon-pan diverticulosis, polyps and nonbleeding ulcers. She was diuresed and echo LVEF down 45% with new HK lateral/distal ant and AK distal inf/inferlat/inferoseptal/apical walls. No cath pursued b/c of CKD and lack of symptoms.but consider ischemic w/u in future if indicated.  Patient is a poor historian and doesn't know why she's here. Denies chest pain, dyspnea, palpitations, edema or any complaints. Says her daughter fixes her meds and she takes them. They have an aide come in to take care of them and make their meals.   Past Medical History:  Diagnosis Date   Broken ankle 2008   left    CHF (congestive heart failure) (HCC)    Diabetes mellitus without complication ([place])    Hypertension    Obesity    Renal disorder    Stage 4 kidney disease    Past Surgical History:  Procedure Laterality Date   BILE DUCT STENT PLACEMENT     BIOPSY  10/23/2020   Procedure: BIOPSY;  Surgeon: Daneil Dolin, MD;  [facility];  Service: Endoscopy;;   COLONOSCOPY WITH PROPOFOL N/A 10/23/2020   Procedure: COLONOSCOPY WITH PROPOFOL;  Surgeon: Daneil Dolin, MD;  [facility];   Service: Endoscopy;  Laterality: N/A;   ESOPHAGOGASTRODUODENOSCOPY (EGD) WITH PROPOFOL N/A 10/23/2020   Procedure: ESOPHAGOGASTRODUODENOSCOPY (EGD) WITH PROPOFOL;  Surgeon: Daneil Dolin, MD;  [facility];  Service: Endoscopy;  Laterality: N/A;   GIVENS CAPSULE STUDY N/A 10/24/2020   Procedure: GIVENS CAPSULE STUDY;  Surgeon: Daneil Dolin, MD;  [facility];  Service: Endoscopy;  Laterality: N/A;   INCISIONAL HERNIA REPAIR     POLYPECTOMY  10/23/2020   Procedure: POLYPECTOMY;  Surgeon: Daneil Dolin, MD;  [facility];  Service: Endoscopy;;     Home Medications:  Prior to Admission medications   Medication Sig Start Date End Date Taking? Authorizing Provider  Ascorbic Acid (VITAMIN C CR) 1000 MG TBCR Take 500 mg by mouth daily. 06/13/20  Yes [provider]  aspirin EC 81 MG tablet Take 1 tablet (81 mg total) by mouth daily. Swallow whole. 10/25/20  Yes Kathie Dike, MD  atorvastatin (LIPITOR) 80 MG tablet Take 1 tablet by mouth daily. 09/25/18  Yes [provider]  Calcium Carb-Cholecalciferol 600-800 MG-UNIT TABS Take 1 tablet by mouth at bedtime.   Yes [provider]  carvedilol (COREG) 12.5 MG tablet Take 1 tablet (12.5 mg total) by mouth 2 (two) times daily with a meal. 10/25/20  Yes Memon, Jolaine Artist, MD  donepezil (ARICEPT) 5 MG tablet Take 5 mg by mouth daily. 10/01/20  Yes [provider]  escitalopram (LEXAPRO) 10 MG tablet Take 10 mg by mouth daily.   Yes [provider]  ferrous sulfate 325 (65 FE) MG EC tablet Take 1 tablet (325 mg total) by mouth 2 (two) times daily. 10/25/20 10/25/21 Yes Kathie Dike, MD  furosemide (LASIX) 40 MG tablet Take 1 tablet (40 mg total) by mouth daily. 10/25/20  Yes Kathie Dike, MD  hydrALAZINE (APRESOLINE) 25 MG tablet Take 1 tablet (25 mg total) by mouth every 8 (eight) hours. 10/25/20  Yes Kathie Dike, MD  Multiple Vitamins-Minerals (MULTIVITAMIN WITH MINERALS)  tablet Take 1 tablet by mouth daily.   Yes [provider]  pantoprazole (PROTONIX) 40 MG tablet Take 1 tablet (40 mg total) by mouth daily. 10/25/20  Yes Kathie Dike, MD  polyethylene glycol (MIRALAX / GLYCOLAX) 17 g packet Take 17 g by mouth daily as needed for mild constipation. 10/25/20  Yes Kathie Dike, MD  sodium bicarbonate 650 MG tablet Take 650 mg by mouth 2 (two) times daily. 10/01/20  Yes [provider]  triamcinolone ointment (KENALOG) 0.1 % Apply 1 application topically 2 (two) times daily.   Yes [provider]  vitamin B-12 (CYANOCOBALAMIN) 500 MCG tablet Take 1 tablet (500 mcg total) by mouth daily. 10/25/20  Yes Kathie Dike, MD    Inpatient Medications: Scheduled Meds:  aspirin EC  81 mg Oral Daily   atorvastatin  80 mg Oral q1800   carvedilol  12.5 mg Oral BID WC   Chlorhexidine Gluconate Cloth  6 each Topical Daily   donepezil  5 mg Oral Daily   escitalopram  10 mg Oral Daily   ferrous sulfate  325 mg Oral BID   furosemide  80 mg Intravenous Daily   heparin  5,000 Units Subcutaneous Q8H   pantoprazole  40 mg Oral Daily   sodium bicarbonate  650 mg Oral BID   sodium chloride flush  3 mL Intravenous Q12H   vitamin B-12  500 mcg Oral Daily   Continuous Infusions:  sodium chloride     nitroGLYCERIN Stopped (11/26/20 0102)   PRN Meds: sodium chloride, acetaminophen, ondansetron (ZOFRAN) IV, sodium chloride flush  Allergies:    Allergies  Allergen Reactions   Macrobid [Nitrofurantoin Monohyd Macro] Other (See Comments)    Pt states "it paralyzed me"   Sulfamethoxazole-Trimethoprim Other (See Comments)    "paralyzed me"   Doxycycline Nausea And Vomiting   Clindamycin/Lincomycin Rash    Social History:   Social History   Socioeconomic History   Marital status: Married    Spouse name: Not on file   Number of children: Not on file   Years of education: Not on file   Highest education level: Not on file  Occupational  History   Not on file  Tobacco Use   Smoking status: Never   Smokeless tobacco: Never  Vaping Use   Vaping Use: Never used  Substance and Sexual Activity   Alcohol use: No   Drug use: No   Sexual activity: Not Currently  Other Topics Concern   Not on file  Social History Narrative   Not on file   Social Determinants of Health   Financial Resource Strain: Not on file  Food Insecurity: Not on file  Transportation Needs: Not on file  Physical Activity: Not on file  Stress: Not on file  Social Connections: Not on file  Intimate Partner Violence: Not on file    Family History:     Family History  Problem Relation  Age of Onset   Cancer Mother    Heart disease Father    Heart attack Father      ROS:  Please see the history of present illness.  Review of Systems  Reason unable to perform ROS: patient confused about why she's here and has no complaints.   All other ROS reviewed and negative.     Physical Exam/Data:   Vitals:   11/26/20 0615 11/26/20 0700 11/26/20 0800 11/26/20 0815  BP: (!) 154/46 (!) 155/40 (!) 161/51   Pulse: 60 (!) 58 69   Resp: 14 13 16    Temp:    98 F (36.7 C)  TempSrc:    Oral  SpO2: 94% 98% 93%   Weight:      Height:        Intake/Output Summary (Last 24 hours) at 11/26/2020 0846 Last data filed at 11/26/2020 0547 Gross per 24 hour  Intake 394.24 ml  Output 1500 ml  Net -1105.76 ml   Last 3 Weights 11/26/2020 11/25/2020 11/11/2020  Weight (lbs) 175 lb 0.7 oz 174 lb 6.1 oz 174 lb 3.2 oz  Weight (kg) 79.4 kg 79.1 kg 79.017 kg     Body mass index is 32.02 kg/m.  General:  Well nourished, well developed, in no acute distress  HEENT: normal Neck: increased JVD Vascular: No carotid bruits; Distal pulses 2+ bilaterally Cardiac:  normal S1, S2; RRR; no murmur   Lungs:  rales at bases  Abd: soft, nontender, no hepatomegaly  Ext: 1-2 edema Musculoskeletal:  No deformities, BUE and BLE strength normal and equal Skin: warm and dry  Neuro:   CNs 2-12 intact, pleasantly confused Psych:  Normal affect   EKG:  The EKG was personally reviewed and demonstrates:  NSR prolonged PR, poor ant R wave progression, no acute change Telemetry:  Telemetry was personally reviewed and demonstrates:  NSR  Relevant CV Studies:  Echo 10/15/20 IMPRESSIONS     1. Compared to echo report from 2017, LVEF is down and wall motion  changes are new.   2. There is hypokinesis of the lateral wall, distal anterior and akinesis  of the distal inferior, distal inferolateral, distal inferoseptal and  apical walls . Left ventricular ejection fraction, by estimation, is 45%.  The left ventricular internal  cavity size was mildly dilated. There is mild left ventricular  hypertrophy. Left ventricular diastolic parameters are consistent with  Grade II diastolic dysfunction (pseudonormalization).   3. Right ventricular systolic function is low normal. The right  ventricular size is normal. There is moderately elevated pulmonary artery  systolic pressure.   4. Left atrial size was moderately dilated.   5. Mild mitral valve regurgitation.   6. The aortic valve is tricuspid. Aortic valve regurgitation is not  visualized. Mild to moderate aortic valve sclerosis/calcification is  present, without any evidence of aortic stenosis.   7. The inferior vena cava is dilated in size with >50% respiratory  variability, suggesting right atrial pressure of 8 mmHg.   Laboratory Data:  High Sensitivity Troponin:   Recent Labs  Lab 11/11/20 1554  TROPONINIHS 15     Chemistry Recent Labs  Lab 11/25/20 0611 11/26/20 0434  NA 137 139  K 4.8 3.7  CL 110 109  CO2 22 22  GLUCOSE 191* 92  BUN 36* 38*  CREATININE 3.01* 2.92*  CALCIUM 7.5* 8.0*  GFRNONAA 16* 16*  ANIONGAP 5 8    Recent Labs  Lab 11/25/20 0611  PROT 6.5  ALBUMIN 3.0*  AST 25  ALT 18  ALKPHOS 83  BILITOT 0.8   Lipids No results for input(s): CHOL, TRIG, HDL, LABVLDL, LDLCALC, CHOLHDL in the  last 168 hours.  Hematology Recent Labs  Lab 11/25/20 0611  WBC 12.7*  RBC 3.35*  HGB 10.1*  HCT 31.8*  MCV 94.9  MCH 30.1  MCHC 31.8  RDW 14.8  PLT 249   Thyroid No results for input(s): TSH, FREET4 in the last 168 hours.  BNP Recent Labs  Lab 11/25/20 0611  BNP 674.0*    DDimer No results for input(s): DDIMER in the last 168 hours.   Radiology/Studies:  DG Chest Portable 1 View  Result Date: 11/25/2020 CLINICAL DATA:  75 year old female with severe shortness of breath this morning. EXAM: PORTABLE CHEST 1 VIEW COMPARISON:  Chest radiographs 11/11/2020 and earlier. FINDINGS: Portable AP upright view at 0625 hours. Lower lung volumes. Stable mild cardiomegaly. Other mediastinal contours are within normal limits. Visualized tracheal air column is within normal limits. Increased bilateral basilar predominant indistinct pulmonary interstitial opacity. Increased blunting of the right costophrenic angle. No superimposed pneumothorax or consolidation. No acute osseous abnormality identified. Paucity of bowel gas in the upper abdomen. IMPRESSION: Mild cardiomegaly with increased pulmonary interstitial opacity and blunting of the right costophrenic angle from earlier this month. Favor acute interstitial edema with small right pleural effusion. Viral/atypical respiratory infection felt less likely. Electronically Signed   By: Genevie Ann M.D.   On: 11/25/2020 06:52     Assessment and Plan:   Acute on chronic systolic and diastolic CHF LVEF 74% with WMA on echo 10/2020-no cath done at that time with CKD, diuresed 1.1L so far, Crt 2.92 today on Lasix 80 mg daily, coreg 12.5 bid(was on hydralazine 25 mg q8 PTA-BP trending high-should restart)  CKD stage IV Crt 2.92 came down some with diuresis  HTN-trending high-hydralazine on hold  HLD on lipitor 80 mg   Anemia S/P transfusion, endo/colonoscopy 10/2020  DM2 per primary team   Risk Assessment/Risk Scores:        New York Heart  Association (NYHA) Functional Class NYHA Class IV        For questions or updates, please contact [facility] Please consult [email or web address] for contact info under    Signed, Ermalinda Barrios, PA-C  11/26/2020 8:46 AM  Attending note Patient seen and discussed with PA Bonnell Public, I agree with her documentation. 75 yo female history of CAD with prior DES to LAD in 12/2014 at [place], hyperlipidemia, HTN, CKD IV, chrnoic combined sysotlic/diastolic HF presents with SOB. Placed on bipap in ER.     Recent discharged 8/20 and 8/15 with HF exacerbations.  10/25/20 admitted with volume overload, requiring NG gtt and diuresis. Also FOBT + that admission,some issues with anemia.    WBC 12.7 Hgb 10.1 Plt 249 K 4.8 Cr 3.01 BNP 674 ABG 7.28/42/142/19 COVID neg CXR probable pulm edema   01/2016 echo LVEF 55-60%, no WMAs 10/2020 echo: LVEF 45%, hypokinesis of the lateral wall, distal anterior and akinesis. Grade II dd    Acute on chronic combined systolic/diastolic HF From 04/5954 echo now WMAs and LVEF decreased from 55-60% to 45%. Given her renal function would not pursue ischemic testing at this time. - at 10/25/20 discharge was put on lasix 40mg  daily. Discharge weight not documented  - presents with recurrent volume overload - she received IV lasix 60mg  x 1 yesterday, due for IV 80mg  today. I/Os are incomplete, mild downtrend in Cr. Weight essentially unchanged 174-175 lbs.  -  medical therapy with coreg 12.5mg  bid. No ACE/ARB/ARNI/aldactone/SGLT2i given advanced renal dysfunction. Soft bp's at times, monitor trends, may start hydral/nitrates.   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    2. CAD - history of prior LAD stent - echo last admit with with drop in LVEF and new WMAs - trop during last admit peak 243 in setting of HF exacerbation, renal dysfunction, hypoxia. Ischemic testing  was not pursued - in general her advanced renal disease, as well as recent issues with anemia FOBT + would prohibit cath   Carlyle Dolly MD

## 2020-11-26 NOTE — Progress Notes (Signed)
PROGRESS NOTE    Sabrina Wheeler  KWI:[id number] DOB: February 10, 1946 DOA: 11/25/2020 PCP: [facility]   Brief Narrative:   Sabrina Wheeler is a 74 y.o. female with medical history of systolic and diastolic CHF, of hypertension, diabetes mellitus, hyperlipidemia, CKD presenting with shortness of breath and respiratory distress.  Patient has been admitted with acute hypoxemic respiratory failure in the setting of acute on chronic diastolic congestive heart failure exacerbation.  This has been a recurrent problem for her.  Cardiology consultation obtained to assist with plans for discharge.  Assessment & Plan:   Active Problems:   Essential hypertension   Mixed hyperlipidemia   Acute respiratory failure with hypoxia (HCC)   Acute on chronic combined systolic and diastolic CHF (congestive heart failure) (HCC)   CKD (chronic kidney disease) stage 4, GFR 15-29 ml/min (HCC)   Acute respiratory failure with hypoxia -Secondary to decompensated CHF -Placed on BiPAP in the ED -Wean off BiPAP as tolerated -Wean oxygen as tolerated   Acute on chronic diastolic CHF -2/42 Echo--EF 45%,+HK lateral wall; +AK inf and inferolateral; G2DD, PASP 45.5 -Continue IV furosemide -Accurate Daily I's and O's -Appreciate cardiology evaluation   CKD stage IV -Baseline creatinine 2.6-2.9 -Monitor with diuresis -Currently with good urine output   Essential hypertension-stable -Patient previously on amlodipine, carvedilol, lisinopril -Restarted carvedilol on admission -Resume home hydralazine -Monitor on IV diuresis -IV labetalol as needed   Hyperlipidemia -Restart statin   Anemia of chronic disease-stable -Patient had a low B12 during her last hospital admission (239) -Iron saturation 7%, ferritin 132--she was given IV -10/23/2020 EGD and colonoscopy, FOBT +   Diabetes mellitus type 2-controlled -Not previously on any agents -10/15/20 Hemoglobin A1c 5.9   DVT  prophylaxis: Heparin Code Status: Full Family Communication: None at bedside Disposition Plan:  Status is: Inpatient  Remains inpatient appropriate because:IV treatments appropriate due to intensity of illness or inability to take PO and Inpatient level of care appropriate due to severity of illness  Dispo: The patient is from: Home              Anticipated d/c is to: Home              Patient currently is not medically stable to d/c.   Difficult to place patient No   Skin Assessment:  I have examined the patient's skin and I agree with the wound assessment as performed by the wound care RN as outlined below:  Pressure Injury 11/25/20 Buttocks Right;Medial;Mid Stage 2 -  Partial thickness loss of dermis presenting as a shallow open injury with a red, pink wound bed without slough. (Active)  11/25/20 0939  Location: Buttocks  Location Orientation: Right;Medial;Mid  Staging: Stage 2 -  Partial thickness loss of dermis presenting as a shallow open injury with a red, pink wound bed without slough.  Wound Description (Comments):   Present on Admission: Yes     Pressure Injury 11/25/20 Buttocks Left;Medial;Mid Stage 2 -  Partial thickness loss of dermis presenting as a shallow open injury with a red, pink wound bed without slough. (Active)  11/25/20 0941  Location: Buttocks  Location Orientation: Left;Medial;Mid  Staging: Stage 2 -  Partial thickness loss of dermis presenting as a shallow open injury with a red, pink wound bed without slough.  Wound Description (Comments):   Present on Admission:     Consultants:  Cardiology  Procedures:  See below  Antimicrobials:  None   Subjective: Patient seen and evaluated  today with no new acute complaints or concerns, she claims that her shortness of breath has been improving. No acute concerns or events noted overnight.  Objective: Vitals:   11/26/20 0615 11/26/20 0700 11/26/20 0800 11/26/20 0815  BP: (!) 154/46 (!) 155/40 (!)  161/51   Pulse: 60 (!) 58 69   Resp: 14 13 16    Temp:    98 F (36.7 C)  TempSrc:    Oral  SpO2: 94% 98% 93%   Weight:      Height:        Intake/Output Summary (Last 24 hours) at 11/26/2020 1021 Last data filed at 11/26/2020 0547 Gross per 24 hour  Intake 369.29 ml  Output 900 ml  Net -530.71 ml   Filed Weights   11/25/20 0933 11/26/20 0411  Weight: 79.1 kg 79.4 kg    Examination:  General exam: Appears calm and comfortable  Respiratory system: Clear to auscultation. Respiratory effort normal.  Currently on 2 L nasal cannula Cardiovascular system: S1 & S2 heard, RRR.  Gastrointestinal system: Abdomen is soft Central nervous system: Alert and awake Extremities: 1-2+ pitting edema bilaterally Skin: No significant lesions noted Psychiatry: Flat affect.    Data Reviewed: I have personally reviewed following labs and imaging studies  CBC: Recent Labs  Lab 11/25/20 0611  WBC 12.7*  NEUTROABS 10.5*  HGB 10.1*  HCT 31.8*  MCV 94.9  PLT 656   Basic Metabolic Panel: Recent Labs  Lab 11/25/20 0611 11/26/20 0434  NA 137 139  K 4.8 3.7  CL 110 109  CO2 22 22  GLUCOSE 191* 92  BUN 36* 38*  CREATININE 3.01* 2.92*  CALCIUM 7.5* 8.0*   GFR: Estimated Creatinine Clearance: 16 mL/min (A) (by C-G formula based on SCr of 2.92 mg/dL (H)). Liver Function Tests: Recent Labs  Lab 11/25/20 0611  AST 25  ALT 18  ALKPHOS 83  BILITOT 0.8  PROT 6.5  ALBUMIN 3.0*   No results for input(s): LIPASE, AMYLASE in the last 168 hours. No results for input(s): AMMONIA in the last 168 hours. Coagulation Profile: Recent Labs  Lab 11/25/20 0611  INR 1.2   Cardiac Enzymes: No results for input(s): CKTOTAL, CKMB, CKMBINDEX, TROPONINI in the last 168 hours. BNP (last 3 results) No results for input(s): PROBNP in the last 8760 hours. HbA1C: No results for input(s): HGBA1C in the last 72 hours. CBG: No results for input(s): GLUCAP in the last 168 hours. Lipid Profile: No  results for input(s): CHOL, HDL, LDLCALC, TRIG, CHOLHDL, LDLDIRECT in the last 72 hours. Thyroid Function Tests: No results for input(s): TSH, T4TOTAL, FREET4, T3FREE, THYROIDAB in the last 72 hours. Anemia Panel: No results for input(s): VITAMINB12, FOLATE, FERRITIN, TIBC, IRON, RETICCTPCT in the last 72 hours. Sepsis Labs: No results for input(s): PROCALCITON, LATICACIDVEN in the last 168 hours.  Recent Results (from the past 240 hour(s))  Resp Panel by RT-PCR (Flu A&B, Covid) Nasopharyngeal Swab     Status: None   Collection Time: 11/25/20  5:59 AM   Specimen: Nasopharyngeal Swab; Nasopharyngeal(NP) swabs in vial transport medium  Result Value Ref Range Status   SARS Coronavirus 2 by RT PCR NEGATIVE NEGATIVE Final    Comment: (NOTE) SARS-CoV-2 target nucleic acids are NOT DETECTED.  The SARS-CoV-2 RNA is generally detectable in upper respiratory specimens during the acute phase of infection. The lowest concentration of SARS-CoV-2 viral copies this assay can detect is 138 copies/mL. A negative result does not preclude SARS-Cov-2 infection and should not be  used as the sole basis for treatment or other patient management decisions. A negative result may occur with  improper specimen collection/handling, submission of specimen other than nasopharyngeal swab, presence of viral mutation(s) within the areas targeted by this assay, and inadequate number of viral copies(<138 copies/mL). A negative result must be combined with clinical observations, patient history, and epidemiological information. The expected result is Negative.  Fact Sheet for Patients:  EntrepreneurPulse.com.au  Fact Sheet for Healthcare Providers:  IncredibleEmployment.be  This test is no t yet approved or cleared by the [place] FDA and  has been authorized for detection and/or diagnosis of SARS-CoV-2 by FDA under an Emergency Use Authorization (EUA). This EUA will remain   in effect (meaning this test can be used) for the duration of the COVID-19 declaration under Section 564(b)(1) of the Act, 21 U.S.C.section 360bbb-3(b)(1), unless the authorization is terminated  or revoked sooner.       Influenza A by PCR NEGATIVE NEGATIVE Final   Influenza B by PCR NEGATIVE NEGATIVE Final    Comment: (NOTE) The Xpert Xpress SARS-CoV-2/FLU/RSV plus assay is intended as an aid in the diagnosis of influenza from Nasopharyngeal swab specimens and should not be used as a sole basis for treatment. Nasal washings and aspirates are unacceptable for Xpert Xpress SARS-CoV-2/FLU/RSV testing.  Fact Sheet for Patients: EntrepreneurPulse.com.au  Fact Sheet for Healthcare Providers: IncredibleEmployment.be  This test is not yet approved or cleared by the [place] FDA and has been authorized for detection and/or diagnosis of SARS-CoV-2 by FDA under an Emergency Use Authorization (EUA). This EUA will remain in effect (meaning this test can be used) for the duration of the COVID-19 declaration under Section 564(b)(1) of the Act, 21 U.S.C. section 360bbb-3(b)(1), unless the authorization is terminated or revoked.  Performed at [facility], [address], [place], [place] [address]   MRSA Next Gen by PCR, Nasal     Status: None   Collection Time: 11/25/20 10:10 PM   Specimen: Nasal Mucosa; Nasal Swab  Result Value Ref Range Status   MRSA by PCR Next Gen NOT DETECTED NOT DETECTED Final    Comment: (NOTE) The GeneXpert MRSA Assay (FDA approved for NASAL specimens only), is one component of a comprehensive MRSA colonization surveillance program. It is not intended to diagnose MRSA infection nor to guide or monitor treatment for MRSA infections. Test performance is not FDA approved in patients less than 16 years old. Performed at [facility], [address] [place], [place] [address]          Radiology Studies: DG Chest  Portable 1 View  Result Date: 11/25/2020 CLINICAL DATA:  75 year old female with severe shortness of breath this morning. EXAM: PORTABLE CHEST 1 VIEW COMPARISON:  Chest radiographs 11/11/2020 and earlier. FINDINGS: Portable AP upright view at 0625 hours. Lower lung volumes. Stable mild cardiomegaly. Other mediastinal contours are within normal limits. Visualized tracheal air column is within normal limits. Increased bilateral basilar predominant indistinct pulmonary interstitial opacity. Increased blunting of the right costophrenic angle. No superimposed pneumothorax or consolidation. No acute osseous abnormality identified. Paucity of bowel gas in the upper abdomen. IMPRESSION: Mild cardiomegaly with increased pulmonary interstitial opacity and blunting of the right costophrenic angle from earlier this month. Favor acute interstitial edema with small right pleural effusion. Viral/atypical respiratory infection felt less likely. Electronically Signed   By: Genevie Ann M.D.   On: 11/25/2020 06:52        Scheduled Meds:  aspirin EC  81 mg Oral Daily  atorvastatin  80 mg Oral q1800   carvedilol  12.5 mg Oral BID WC   Chlorhexidine Gluconate Cloth  6 each Topical Daily   donepezil  5 mg Oral Daily   escitalopram  10 mg Oral Daily   ferrous sulfate  325 mg Oral BID   furosemide  80 mg Intravenous Daily   heparin  5,000 Units Subcutaneous Q8H   pantoprazole  40 mg Oral Daily   sodium bicarbonate  650 mg Oral BID   sodium chloride flush  3 mL Intravenous Q12H   vitamin B-12  500 mcg Oral Daily   Continuous Infusions:  sodium chloride     nitroGLYCERIN Stopped (11/26/20 0102)     LOS: 1 day    Time spent: 35 minutes    Loghan Kurtzman Darleen Crocker, DO [facility]  If 7PM-7AM, please contact night-coverage [email or web address] 11/26/2020, 10:21 AM

## 2020-11-27 DIAGNOSIS — I5043 Acute on chronic combined systolic (congestive) and diastolic (congestive) heart failure: Secondary | ICD-10-CM | POA: Diagnosis not present

## 2020-11-27 LAB — BASIC METABOLIC PANEL
Anion gap: 8 (ref 5–15)
BUN: 40 mg/dL — ABNORMAL HIGH (ref 8–23)
CO2: 23 mmol/L (ref 22–32)
Calcium: 7.7 mg/dL — ABNORMAL LOW (ref 8.9–10.3)
Chloride: 107 mmol/L (ref 98–111)
Creatinine, Ser: 2.9 mg/dL — ABNORMAL HIGH (ref 0.44–1.00)
GFR, Estimated: 16 mL/min — ABNORMAL LOW (ref >60)
Glucose, Bld: 115 mg/dL — ABNORMAL HIGH (ref 70–99)
Potassium: 3.4 mmol/L — ABNORMAL LOW (ref 3.5–5.1)
Sodium: 138 mmol/L (ref 135–145)

## 2020-11-27 LAB — MAGNESIUM: Magnesium: 1.7 mg/dL (ref 1.7–2.4)

## 2020-11-27 LAB — CBC
HCT: 26.3 % — ABNORMAL LOW (ref 36.0–46.0)
Hemoglobin: 8.3 g/dL — ABNORMAL LOW (ref 12.0–15.0)
MCH: 29.4 pg (ref 26.0–34.0)
MCHC: 31.6 g/dL (ref 30.0–36.0)
MCV: 93.3 fL (ref 80.0–100.0)
Platelets: 207 10*3/uL (ref 150–400)
RBC: 2.82 MIL/uL — ABNORMAL LOW (ref 3.87–5.11)
RDW: 14.6 % (ref 11.5–15.5)
WBC: 7.4 10*3/uL (ref 4.0–10.5)
nRBC: 0 % (ref 0.0–0.2)

## 2020-11-27 MED ORDER — POTASSIUM CHLORIDE CRYS ER 20 MEQ PO TBCR
40.0000 meq | EXTENDED_RELEASE_TABLET | Freq: Two times a day (BID) | ORAL | Status: AC
  Administered 2020-11-27 (×2): 40 meq via ORAL
  Filled 2020-11-27 (×2): qty 2

## 2020-11-27 MED ORDER — ISOSORBIDE MONONITRATE ER 30 MG PO TB24
15.0000 mg | ORAL_TABLET | Freq: Every day | ORAL | Status: DC
  Administered 2020-11-27 – 2020-11-28 (×2): 15 mg via ORAL
  Filled 2020-11-27 (×2): qty 1

## 2020-11-27 NOTE — Evaluation (Signed)
Physical Therapy Evaluation Patient Details Name: Sabrina Wheeler MRN: [id number] DOB: March 14, 1945 Today's Date: 11/27/2020  History of Present Illness  Sabrina Wheeler is a 75 y.o. female with medical history of systolic and diastolic CHF, of hypertension, diabetes mellitus, hyperlipidemia, CKD presenting with shortness of breath and respiratory distress.  History is limited secondary to the patient being in extremis.  Patient was recently admitted to the hospital from 10/14/2020 to 10/25/2020 secondary to respiratory failure from acute systolic and diastolic CHF.  She was discharged home with furosemide 40 mg daily.  During that hospitalization, the patient was noted to have a drop in her hemoglobin with positive FOBT.  EGD on 10/23/2020 showed nonobstructive Schatzki's ring with small hiatus hernia.  10/23/2020 colonoscopy showed pandiverticulosis with 2 descending colon polyps and nonbleeding internal hemorrhoids.  She was transfused 1 unit PRBC.  She was discharged home with home health.  Prior to hospitalization, the patient required BiPAP as well as nitroglycerin drip.  She was on IV heparin for 48 hours.  She was not felt to be a good candidate for heart catheterization secondary to her renal insufficiency.   Clinical Impression  Patient seated in chair at beginning of session. She appears to be functioning near baseline for functional mobility and gait although is limited by decreased endurance, activity tolerance and strength. Patient requires min assist to transfer to standing with RW due to slight LE weakness. Patient ambulates with gradually increasing fatigue and SOB. She ambulates without loss of balance but does drift intermittently to R. Patient returned to chair at end of session. Patient will benefit from continued physical therapy in hospital and recommended venue below to increase strength, balance, endurance for safe ADLs and gait.        Recommendations for follow up therapy are one  component of a multi-disciplinary discharge planning process, led by the attending physician.  Recommendations may be updated based on patient status, additional functional criteria and insurance authorization.  Follow Up Recommendations Home health PT;Supervision for mobility/OOB    Equipment Recommendations  None recommended by PT    Recommendations for Other Services       Precautions / Restrictions Precautions Precautions: Fall Restrictions Weight Bearing Restrictions: No      Mobility  Bed Mobility               General bed mobility comments: seated in chair at beginning of session    Transfers Overall transfer level: Needs assistance Equipment used: Rolling walker (2 wheeled) Transfers: Sit to/from Stand;Stand Pivot Transfers Sit to Stand: Min guard;Min assist Stand pivot transfers: Min guard;Min assist       General transfer comment: assist for slight weakness with transfer  Ambulation/Gait Ambulation/Gait assistance: Min guard Gait Distance (Feet): 100 Feet Assistive device: Rolling walker (2 wheeled) Gait Pattern/deviations: Step-through pattern;Decreased stride length;Drifts right/left Gait velocity: decreased   General Gait Details: slow, labored cadence with R drift with ambulation  Stairs            Wheelchair Mobility    Modified Rankin (Stroke Patients Only)       Balance Overall balance assessment: Needs assistance Sitting-balance support: No upper extremity supported;Feet supported Sitting balance-Leahy Scale: Good Sitting balance - Comments: seated edge of chair   Standing balance support: Bilateral upper extremity supported Standing balance-Leahy Scale: Fair Standing balance comment: with RW  Pertinent Vitals/Pain Pain Assessment: No/denies pain    Home Living Family/patient expects to be discharged to:: Private residence Living Arrangements: Spouse/significant other Available  Help at Discharge: Family;Personal care attendant (aid: morning and afternoon) Type of Home: House Home Access: Stairs to enter Entrance Stairs-Rails: None Entrance Stairs-Number of Steps: 1 Home Layout: One level;Able to live on main level with bedroom/bathroom Home Equipment: Gilford Rile - 2 wheels;Cane - single point      Prior Function Level of Independence: Independent with assistive device(s)         Comments: aid assists     Hand Dominance        Extremity/Trunk Assessment   Upper Extremity Assessment Upper Extremity Assessment: Generalized weakness    Lower Extremity Assessment Lower Extremity Assessment: Generalized weakness    Cervical / Trunk Assessment Cervical / Trunk Assessment: Normal  Communication   Communication: No difficulties  Cognition Arousal/Alertness: Awake/alert Behavior During Therapy: WFL for tasks assessed/performed Overall Cognitive Status: Within Functional Limits for tasks assessed                                        General Comments      Exercises     Assessment/Plan    PT Assessment Patient needs continued PT services  PT Problem List Decreased strength;Decreased mobility;Decreased activity tolerance;Decreased balance       PT Treatment Interventions DME instruction;Therapeutic exercise;Gait training;Balance training;Stair training;Neuromuscular re-education;Functional mobility training;Therapeutic activities;Patient/family education    PT Goals (Current goals can be found in the Care Plan section)  Acute Rehab PT Goals Patient Stated Goal: return home and be able to walk more PT Goal Formulation: With patient Time For Goal Achievement: 12/11/20 Potential to Achieve Goals: Good    Frequency Min 3X/week   Barriers to discharge        Co-evaluation               AM-PAC PT "6 Clicks" Mobility  Outcome Measure Help needed turning from your back to your side while in a flat bed without using  bedrails?: None Help needed moving from lying on your back to sitting on the side of a flat bed without using bedrails?: A Little Help needed moving to and from a bed to a chair (including a wheelchair)?: A Little Help needed standing up from a chair using your arms (e.g., wheelchair or bedside chair)?: A Little Help needed to walk in hospital room?: A Little Help needed climbing 3-5 steps with a railing? : A Lot 6 Click Score: 18    End of Session Equipment Utilized During Treatment: Gait belt Activity Tolerance: Patient tolerated treatment well;Patient limited by fatigue Patient left: in chair;with call bell/phone within reach Nurse Communication: Mobility status PT Visit Diagnosis: Unsteadiness on feet (R26.81);Other abnormalities of gait and mobility (R26.89);Muscle weakness (generalized) (M62.81)    Time: 6837-2902 PT Time Calculation (min) (ACUTE ONLY): 12 min   Charges:   PT Evaluation $PT Eval Low Complexity: 1 Low PT Treatments $Therapeutic Activity: 8-22 mins       3:15 PM, 11/27/20 Mearl Latin PT, DPT Physical Therapist at [facility]

## 2020-11-27 NOTE — Plan of Care (Signed)
  Problem: Acute Rehab PT Goals(only PT should resolve) Goal: Patient Will Transfer Sit To/From Stand Outcome: Progressing Flowsheets (Taken 11/27/2020 1516) Patient will transfer sit to/from stand:  with min guard assist  with supervision Goal: Pt Will Transfer Bed To Chair/Chair To Bed Outcome: Progressing Flowsheets (Taken 11/27/2020 1516) Pt will Transfer Bed to Chair/Chair to Bed:  min guard assist  with supervision Goal: Pt Will Ambulate Outcome: Progressing Flowsheets (Taken 11/27/2020 1516) Pt will Ambulate:  > 125 feet  with supervision  with least restrictive assistive device Goal: Pt/caregiver will Perform Home Exercise Program Outcome: Progressing Flowsheets (Taken 11/27/2020 1516) Pt/caregiver will Perform Home Exercise Program:  For increased strengthening  For improved balance  Independently  3:16 PM, 11/27/20 Mearl Latin PT, DPT Physical Therapist at [facility]

## 2020-11-27 NOTE — Progress Notes (Addendum)
PROGRESS NOTE    Sabrina Wheeler  HCW:[id number] DOB: 1945-09-13 DOA: 11/25/2020 PCP: [facility]   Brief Narrative:   Sabrina Wheeler is a 75 y.o. female with medical history of systolic and diastolic CHF, of hypertension, diabetes mellitus, hyperlipidemia, CKD presenting with shortness of breath and respiratory distress.  Patient has been admitted with acute hypoxemic respiratory failure in the setting of acute on chronic diastolic congestive heart failure exacerbation.  This has been a recurrent problem for her.  Cardiology consultation obtained to assist with plans for discharge.  Assessment & Plan:   Active Problems:   Essential hypertension   Mixed hyperlipidemia   Acute respiratory failure with hypoxia (HCC)   Acute on chronic combined systolic and diastolic CHF (congestive heart failure) (HCC)   CKD (chronic kidney disease) stage 4, GFR 15-29 ml/min (HCC)   Acute respiratory failure with hypoxia -Secondary to decompensated CHF -Placed on BiPAP in the ED -Wean off BiPAP as tolerated -Wean oxygen as tolerated   Acute on chronic diastolic CHF -1/76 Echo--EF 45%,+HK lateral wall; +AK inf and inferolateral; G2DD, PASP 45.5 -Continue IV furosemide -Accurate Daily I's and O's -Appreciate cardiology evaluation with likely plans for dc by 9/23 on Lasix 40mg  BID  Hypokalemia -Replete and recheck   CKD stage IV -Baseline creatinine 2.6-2.9 -Monitor with diuresis -Currently with good urine output   Essential hypertension-stable -Patient previously on amlodipine, carvedilol, lisinopril -Restarted carvedilol on admission -Resumed home hydralazine -Monitor on IV diuresis -IV labetalol as needed   Hyperlipidemia -Restart statin   Anemia of chronic disease-stable -Patient had a low B12 during her last hospital admission (239) -Iron saturation 7%, ferritin 132--she was given IV -10/23/2020 EGD and colonoscopy, FOBT +   Diabetes mellitus type  2-controlled -Not previously on any agents -10/15/20 Hemoglobin A1c 5.9     DVT prophylaxis: Heparin Code Status: Full Family Communication: Discussed with daughter 9/22 Disposition Plan:  Status is: Inpatient   Remains inpatient appropriate because:IV treatments appropriate due to intensity of illness or inability to take PO and Inpatient level of care appropriate due to severity of illness   Dispo: The patient is from: Home              Anticipated d/c is to: Home vs SNF              Patient currently is not medically stable to d/c.              Difficult to place patient No     Skin Assessment:   I have examined the patient's skin and I agree with the wound assessment as performed by the wound care RN as outlined below:   Pressure Injury 11/25/20 Buttocks Right;Medial;Mid Stage 2 -  Partial thickness loss of dermis presenting as a shallow open injury with a red, pink wound bed without slough. (Active)  11/25/20 0939  Location: Buttocks  Location Orientation: Right;Medial;Mid  Staging: Stage 2 -  Partial thickness loss of dermis presenting as a shallow open injury with a red, pink wound bed without slough.  Wound Description (Comments):   Present on Admission: Yes     Pressure Injury 11/25/20 Buttocks Left;Medial;Mid Stage 2 -  Partial thickness loss of dermis presenting as a shallow open injury with a red, pink wound bed without slough. (Active)  11/25/20 0941  Location: Buttocks  Location Orientation: Left;Medial;Mid  Staging: Stage 2 -  Partial thickness loss of dermis presenting as a shallow open injury with a  red, pink wound bed without slough.  Wound Description (Comments):   Present on Admission:       Consultants:  Cardiology   Procedures:  See below   Antimicrobials:  None    Subjective: Patient seen and evaluated today with no new acute complaints or concerns. She is breathing better. No acute concerns or events noted overnight.  Objective: Vitals:    11/26/20 2224 11/27/20 0047 11/27/20 0436 11/27/20 0500  BP: (!) 144/52 (!) 152/59 (!) 125/41   Pulse:  66 61   Resp: 20 20 20    Temp: 98.9 F (37.2 C) 98.7 F (37.1 C) 98.2 F (36.8 C)   TempSrc: Oral Oral Oral   SpO2: 92% 93% 93%   Weight:    79.5 kg  Height:        Intake/Output Summary (Last 24 hours) at 11/27/2020 0934 Last data filed at 11/27/2020 0916 Gross per 24 hour  Intake 920 ml  Output 1850 ml  Net -930 ml   Filed Weights   11/25/20 0933 11/26/20 0411 11/27/20 0500  Weight: 79.1 kg 79.4 kg 79.5 kg    Examination:  General exam: Appears calm and comfortable  Respiratory system: Clear to auscultation. Respiratory effort normal. Cardiovascular system: S1 & S2 heard, RRR.  Gastrointestinal system: Abdomen is soft Central nervous system: Alert and awake Extremities: Scant edema Skin: No significant lesions noted Psychiatry: Flat affect.    Data Reviewed: I have personally reviewed following labs and imaging studies  CBC: Recent Labs  Lab 11/25/20 0611 11/27/20 0537  WBC 12.7* 7.4  NEUTROABS 10.5*  --   HGB 10.1* 8.3*  HCT 31.8* 26.3*  MCV 94.9 93.3  PLT 249 373   Basic Metabolic Panel: Recent Labs  Lab 11/25/20 0611 11/26/20 0434 11/27/20 0537  NA 137 139 138  K 4.8 3.7 3.4*  CL 110 109 107  CO2 22 22 23   GLUCOSE 191* 92 115*  BUN 36* 38* 40*  CREATININE 3.01* 2.92* 2.90*  CALCIUM 7.5* 8.0* 7.7*  MG  --   --  1.7   GFR: Estimated Creatinine Clearance: 16.1 mL/min (A) (by C-G formula based on SCr of 2.9 mg/dL (H)). Liver Function Tests: Recent Labs  Lab 11/25/20 0611  AST 25  ALT 18  ALKPHOS 83  BILITOT 0.8  PROT 6.5  ALBUMIN 3.0*   No results for input(s): LIPASE, AMYLASE in the last 168 hours. No results for input(s): AMMONIA in the last 168 hours. Coagulation Profile: Recent Labs  Lab 11/25/20 0611  INR 1.2   Cardiac Enzymes: No results for input(s): CKTOTAL, CKMB, CKMBINDEX, TROPONINI in the last 168 hours. BNP (last  3 results) No results for input(s): PROBNP in the last 8760 hours. HbA1C: No results for input(s): HGBA1C in the last 72 hours. CBG: No results for input(s): GLUCAP in the last 168 hours. Lipid Profile: No results for input(s): CHOL, HDL, LDLCALC, TRIG, CHOLHDL, LDLDIRECT in the last 72 hours. Thyroid Function Tests: No results for input(s): TSH, T4TOTAL, FREET4, T3FREE, THYROIDAB in the last 72 hours. Anemia Panel: No results for input(s): VITAMINB12, FOLATE, FERRITIN, TIBC, IRON, RETICCTPCT in the last 72 hours. Sepsis Labs: No results for input(s): PROCALCITON, LATICACIDVEN in the last 168 hours.  Recent Results (from the past 240 hour(s))  Resp Panel by RT-PCR (Flu A&B, Covid) Nasopharyngeal Swab     Status: None   Collection Time: 11/25/20  5:59 AM   Specimen: Nasopharyngeal Swab; Nasopharyngeal(NP) swabs in vial transport medium  Result Value Ref Range  Status   SARS Coronavirus 2 by RT PCR NEGATIVE NEGATIVE Final    Comment: (NOTE) SARS-CoV-2 target nucleic acids are NOT DETECTED.  The SARS-CoV-2 RNA is generally detectable in upper respiratory specimens during the acute phase of infection. The lowest concentration of SARS-CoV-2 viral copies this assay can detect is 138 copies/mL. A negative result does not preclude SARS-Cov-2 infection and should not be used as the sole basis for treatment or other patient management decisions. A negative result may occur with  improper specimen collection/handling, submission of specimen other than nasopharyngeal swab, presence of viral mutation(s) within the areas targeted by this assay, and inadequate number of viral copies(<138 copies/mL). A negative result must be combined with clinical observations, patient history, and epidemiological information. The expected result is Negative.  Fact Sheet for Patients:  EntrepreneurPulse.com.au  Fact Sheet for Healthcare Providers:   IncredibleEmployment.be  This test is no t yet approved or cleared by the [place] FDA and  has been authorized for detection and/or diagnosis of SARS-CoV-2 by FDA under an Emergency Use Authorization (EUA). This EUA will remain  in effect (meaning this test can be used) for the duration of the COVID-19 declaration under Section 564(b)(1) of the Act, 21 U.S.C.section 360bbb-3(b)(1), unless the authorization is terminated  or revoked sooner.       Influenza A by PCR NEGATIVE NEGATIVE Final   Influenza B by PCR NEGATIVE NEGATIVE Final    Comment: (NOTE) The Xpert Xpress SARS-CoV-2/FLU/RSV plus assay is intended as an aid in the diagnosis of influenza from Nasopharyngeal swab specimens and should not be used as a sole basis for treatment. Nasal washings and aspirates are unacceptable for Xpert Xpress SARS-CoV-2/FLU/RSV testing.  Fact Sheet for Patients: EntrepreneurPulse.com.au  Fact Sheet for Healthcare Providers: IncredibleEmployment.be  This test is not yet approved or cleared by the [place] FDA and has been authorized for detection and/or diagnosis of SARS-CoV-2 by FDA under an Emergency Use Authorization (EUA). This EUA will remain in effect (meaning this test can be used) for the duration of the COVID-19 declaration under Section 564(b)(1) of the Act, 21 U.S.C. section 360bbb-3(b)(1), unless the authorization is terminated or revoked.  Performed at [facility], [address], [place], [place] [address]   Urine Culture     Status: None   Collection Time: 11/25/20  8:46 AM   Specimen: Urine, Catheterized  Result Value Ref Range Status   Specimen Description   Final    URINE, CATHETERIZED Performed at [facility], [address] [place], [place] [address]    Special Requests   Final    NONE Performed at [facility], [address] [place], [place] [address]    Culture   Final    NO GROWTH Performed  at [facility], [place] [address], [place], [place] [address]    Report Status 11/26/2020 FINAL  Final  MRSA Next Gen by PCR, Nasal     Status: None   Collection Time: 11/25/20 10:10 PM   Specimen: Nasal Mucosa; Nasal Swab  Result Value Ref Range Status   MRSA by PCR Next Gen NOT DETECTED NOT DETECTED Final    Comment: (NOTE) The GeneXpert MRSA Assay (FDA approved for NASAL specimens only), is one component of a comprehensive MRSA colonization surveillance program. It is not intended to diagnose MRSA infection nor to guide or monitor treatment for MRSA infections. Test performance is not FDA approved in patients less than 81 years old. Performed at [facility], [place]  [address], [place], [place] [address]          Radiology Studies: No results found.      Scheduled Meds:  aspirin EC  81 mg Oral Daily   atorvastatin  80 mg Oral q1800   carvedilol  12.5 mg Oral BID WC   Chlorhexidine Gluconate Cloth  6 each Topical Daily   donepezil  5 mg Oral Daily   escitalopram  10 mg Oral Daily   ferrous sulfate  325 mg Oral BID   furosemide  80 mg Intravenous Daily   heparin  5,000 Units Subcutaneous Q8H   hydrALAZINE  25 mg Oral Q8H   isosorbide mononitrate  15 mg Oral Daily   pantoprazole  40 mg Oral Daily   potassium chloride  40 mEq Oral BID   sodium bicarbonate  650 mg Oral BID   sodium chloride flush  3 mL Intravenous Q12H   vitamin B-12  500 mcg Oral Daily   Continuous Infusions:  sodium chloride       LOS: 2 days    Time spent: 35 minutes    Fishel Wamble Darleen Crocker, DO [facility]  If 7PM-7AM, please contact night-coverage [email or web address] 11/27/2020, 9:34 AM

## 2020-11-27 NOTE — Progress Notes (Signed)
Progress Note  Patient Name: Sabrina Wheeler Date of Encounter: 11/27/2020  [facility] Cardiologist: Carlyle Dolly, MD   Subjective   Breathing much improved  Inpatient Medications    Scheduled Meds:  aspirin EC  81 mg Oral Daily   atorvastatin  80 mg Oral q1800   carvedilol  12.5 mg Oral BID WC   Chlorhexidine Gluconate Cloth  6 each Topical Daily   donepezil  5 mg Oral Daily   escitalopram  10 mg Oral Daily   ferrous sulfate  325 mg Oral BID   furosemide  80 mg Intravenous Daily   heparin  5,000 Units Subcutaneous Q8H   hydrALAZINE  25 mg Oral Q8H   pantoprazole  40 mg Oral Daily   potassium chloride  40 mEq Oral BID   sodium bicarbonate  650 mg Oral BID   sodium chloride flush  3 mL Intravenous Q12H   vitamin B-12  500 mcg Oral Daily   Continuous Infusions:  sodium chloride     nitroGLYCERIN Stopped (11/26/20 0102)   PRN Meds: sodium chloride, acetaminophen, labetalol, ondansetron (ZOFRAN) IV, sodium chloride flush   Vital Signs    Vitals:   11/26/20 2224 11/27/20 0047 11/27/20 0436 11/27/20 0500  BP: (!) 144/52 (!) 152/59 (!) 125/41   Pulse:  66 61   Resp: 20 20 20    Temp: 98.9 F (37.2 C) 98.7 F (37.1 C) 98.2 F (36.8 C)   TempSrc: Oral Oral Oral   SpO2: 92% 93% 93%   Weight:    79.5 kg  Height:        Intake/Output Summary (Last 24 hours) at 11/27/2020 0832 Last data filed at 11/27/2020 0500 Gross per 24 hour  Intake 440 ml  Output 1850 ml  Net -1410 ml   Last 3 Weights 11/27/2020 11/26/2020 11/25/2020  Weight (lbs) 175 lb 4.3 oz 175 lb 0.7 oz 174 lb 6.1 oz  Weight (kg) 79.5 kg 79.4 kg 79.1 kg      Telemetry    SR - Personally Reviewed  ECG    N/a - Personally Reviewed  Physical Exam   GEN: No acute distress.   Neck: elevated JVD Cardiac: RRR, no murmurs, rubs, or gallops.  Respiratory: mild crackles bilaterally GI: Soft, nontender, non-distended  MS: No edema; No deformity. Neuro:  Nonfocal  Psych: Normal affect   Labs     High Sensitivity Troponin:   Recent Labs  Lab 11/11/20 1554  TROPONINIHS 15     Chemistry Recent Labs  Lab 11/25/20 0611 11/26/20 0434 11/27/20 0537  NA 137 139 138  K 4.8 3.7 3.4*  CL 110 109 107  CO2 22 22 23   GLUCOSE 191* 92 115*  BUN 36* 38* 40*  CREATININE 3.01* 2.92* 2.90*  CALCIUM 7.5* 8.0* 7.7*  MG  --   --  1.7  PROT 6.5  --   --   ALBUMIN 3.0*  --   --   AST 25  --   --   ALT 18  --   --   ALKPHOS 83  --   --   BILITOT 0.8  --   --   GFRNONAA 16* 16* 16*  ANIONGAP 5 8 8     Lipids No results for input(s): CHOL, TRIG, HDL, LABVLDL, LDLCALC, CHOLHDL in the last 168 hours.  Hematology Recent Labs  Lab 11/25/20 0611 11/27/20 0537  WBC 12.7* 7.4  RBC 3.35* 2.82*  HGB 10.1* 8.3*  HCT 31.8* 26.3*  MCV 94.9  93.3  MCH 30.1 29.4  MCHC 31.8 31.6  RDW 14.8 14.6  PLT 249 207   Thyroid No results for input(s): TSH, FREET4 in the last 168 hours.  BNP Recent Labs  Lab 11/25/20 0611  BNP 674.0*    DDimer No results for input(s): DDIMER in the last 168 hours.   Radiology    No results found.  Cardiac Studies     Patient Profile     Sabrina Wheeler is a 75 y.o. female with a hx of systolic and diastolic CHF who is being seen 11/26/2020 for the evaluation of recurrent CHF at the request of Dr. Manuella Ghazi.  Assessment & Plan      Acute on chronic combined systolic/diastolic HF From 10/9167 echo now WMAs and LVEF decreased from 55-60% to 45%. Given her renal function would not pursue ischemic testing at this time. - at 10/25/20 discharge was put on lasix 40mg  daily. Discharge weight not documented  - neg 1.1 L yesterday, neg 2.2 L since admissino - she is on IV lasix 80mg  daily. Overall stable renal function.  - medical therapy with coreg 12.5mg  bid, hydral 25mg  tid, imdur 15mg . No ACE/ARB/ARNI/aldactone/SGLT2i given advanced renal dysfunction.   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   - some ongoing fluid overload, continue IV diuresis. Potential d/c tomorrow on higher home diuretic dose, likely lasix 40mg  bid.   2. CAD - history of prior LAD stent - echo last admit with with drop in LVEF and new WMAs - trop during last admit peak 243 in setting of HF exacerbation, renal dysfunction, hypoxia. Ischemic testing was not pursued - in general her advanced renal disease, as well as recent issues with anemia FOBT + would prohibit cath  For questions or updates, please contact [place] Please consult [email or web address] for contact info under        Signed, Carlyle Dolly, MD  11/27/2020, 8:32 AM

## 2020-11-28 DIAGNOSIS — I5043 Acute on chronic combined systolic (congestive) and diastolic (congestive) heart failure: Secondary | ICD-10-CM | POA: Diagnosis not present

## 2020-11-28 LAB — BASIC METABOLIC PANEL
Anion gap: 8 (ref 5–15)
BUN: 39 mg/dL — ABNORMAL HIGH (ref 8–23)
CO2: 23 mmol/L (ref 22–32)
Calcium: 8 mg/dL — ABNORMAL LOW (ref 8.9–10.3)
Chloride: 107 mmol/L (ref 98–111)
Creatinine, Ser: 2.87 mg/dL — ABNORMAL HIGH (ref 0.44–1.00)
GFR, Estimated: 16 mL/min — ABNORMAL LOW (ref >60)
Glucose, Bld: 108 mg/dL — ABNORMAL HIGH (ref 70–99)
Potassium: 3.7 mmol/L (ref 3.5–5.1)
Sodium: 138 mmol/L (ref 135–145)

## 2020-11-28 LAB — MAGNESIUM: Magnesium: 1.7 mg/dL (ref 1.7–2.4)

## 2020-11-28 MED ORDER — ISOSORBIDE MONONITRATE ER 30 MG PO TB24: 15.0000 mg | ORAL_TABLET | Freq: Every day | ORAL | 3 refills | Status: AC

## 2020-11-28 MED ORDER — FUROSEMIDE 40 MG PO TABS
40.0000 mg | ORAL_TABLET | Freq: Two times a day (BID) | ORAL | Status: DC
  Administered 2020-11-28: 40 mg via ORAL
  Filled 2020-11-28: qty 1

## 2020-11-28 MED ORDER — FUROSEMIDE 40 MG PO TABS: 40.0000 mg | ORAL_TABLET | Freq: Two times a day (BID) | ORAL | 2 refills | Status: AC

## 2020-11-28 NOTE — Progress Notes (Signed)
Progress Note  Patient Name: Sabrina Wheeler Date of Encounter: 11/28/2020  [facility] Cardiologist: Carlyle Dolly, MD   Subjective   No complaints  Inpatient Medications    Scheduled Meds:  aspirin EC  81 mg Oral Daily   atorvastatin  80 mg Oral q1800   carvedilol  12.5 mg Oral BID WC   Chlorhexidine Gluconate Cloth  6 each Topical Daily   donepezil  5 mg Oral Daily   escitalopram  10 mg Oral Daily   ferrous sulfate  325 mg Oral BID   furosemide  40 mg Oral BID   heparin  5,000 Units Subcutaneous Q8H   hydrALAZINE  25 mg Oral Q8H   isosorbide mononitrate  15 mg Oral Daily   pantoprazole  40 mg Oral Daily   sodium bicarbonate  650 mg Oral BID   sodium chloride flush  3 mL Intravenous Q12H   vitamin B-12  500 mcg Oral Daily   Continuous Infusions:  sodium chloride     PRN Meds: sodium chloride, acetaminophen, labetalol, ondansetron (ZOFRAN) IV, sodium chloride flush   Vital Signs    Vitals:   11/27/20 1318 11/27/20 2103 11/28/20 0517 11/28/20 0705  BP: (!) 133/30 (!) 135/43 (!) 143/58   Pulse: (!) 57 (!) 56 (!) 57   Resp: 16 18 13    Temp: 98.2 F (36.8 C) 97.9 F (36.6 C) 98.6 F (37 C)   TempSrc: Oral Oral Oral   SpO2: 97% 95% 94%   Weight:    78.6 kg  Height:        Intake/Output Summary (Last 24 hours) at 11/28/2020 0846 Last data filed at 11/28/2020 0015 Gross per 24 hour  Intake 480 ml  Output 651 ml  Net -171 ml   Last 3 Weights 11/28/2020 11/27/2020 11/26/2020  Weight (lbs) 173 lb 4.5 oz 175 lb 4.3 oz 175 lb 0.7 oz  Weight (kg) 78.6 kg 79.5 kg 79.4 kg      Telemetry    SR - Personally Reviewed  ECG    N/a - Personally Reviewed  Physical Exam   GEN: No acute distress.   Neck: No JVD Cardiac: RRR, no murmurs, rubs, or gallops.  Respiratory: Clear to auscultation bilaterally. GI: Soft, nontender, non-distended  MS: No edema; No deformity. Neuro:  Nonfocal  Psych: Normal affect   Labs    High Sensitivity Troponin:   Recent  Labs  Lab 11/11/20 1554  TROPONINIHS 15     Chemistry Recent Labs  Lab 11/25/20 0611 11/26/20 0434 11/27/20 0537 11/28/20 0546  NA 137 139 138 138  K 4.8 3.7 3.4* 3.7  CL 110 109 107 107  CO2 22 22 23 23   GLUCOSE 191* 92 115* 108*  BUN 36* 38* 40* 39*  CREATININE 3.01* 2.92* 2.90* 2.87*  CALCIUM 7.5* 8.0* 7.7* 8.0*  MG  --   --  1.7 1.7  PROT 6.5  --   --   --   ALBUMIN 3.0*  --   --   --   AST 25  --   --   --   ALT 18  --   --   --   ALKPHOS 83  --   --   --   BILITOT 0.8  --   --   --   GFRNONAA 16* 16* 16* 16*  ANIONGAP 5 8 8 8     Lipids No results for input(s): CHOL, TRIG, HDL, LABVLDL, LDLCALC, CHOLHDL in the last 168 hours.  Hematology Recent Labs  Lab 11/25/20 0611 11/27/20 0537  WBC 12.7* 7.4  RBC 3.35* 2.82*  HGB 10.1* 8.3*  HCT 31.8* 26.3*  MCV 94.9 93.3  MCH 30.1 29.4  MCHC 31.8 31.6  RDW 14.8 14.6  PLT 249 207   Thyroid No results for input(s): TSH, FREET4 in the last 168 hours.  BNP Recent Labs  Lab 11/25/20 0611  BNP 674.0*    DDimer No results for input(s): DDIMER in the last 168 hours.   Radiology    No results found.  Cardiac Studies    Patient Profile        Assessment & Plan    Acute on chronic combined systolic/diastolic HF From 06/2874 echo now WMAs and LVEF decreased from 55-60% to 45% with new WMAs. Given her renal function would not pursue ischemic testing at this time. - at 10/25/20 discharge was put on lasix 40mg  daily. Discharge weight not documented   -I/Os incomplete yesterday. By weights down from 175 to 173 lbs today.  - she is on IV lasix 80mg  daily. Overall stable renal function.  - medical therapy with coreg 12.5mg  bid, hydral 25mg  tid, imdur 15mg . No ACE/ARB/ARNI/aldactone/SGLT2i given advanced renal dysfunction.    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    - symptoms have resolved, appears  euvolemic today - start lasix 40mg  oral bid.     2. CAD - history of prior LAD stent - echo last admit with with drop in LVEF and new WMAs - trop during last admit peak 243 in setting of HF exacerbation, renal dysfunction, hypoxia. Ischemic testing was not pursued - in general her advanced renal disease, as well as recent issues with anemia FOBT + would prohibit cath  [facility] for discharge from cardiac standpoint, we will arrange outpatient f/u and signoff inpatient care.  For questions or updates, please contact [place] Please consult [email or web address] for contact info under        Signed, Carlyle Dolly, MD  11/28/2020, 8:46 AM

## 2020-11-28 NOTE — Progress Notes (Signed)
Physical Therapy Treatment Patient Details Name: Sabrina Wheeler MRN: [id number] DOB: 17-Aug-1945 Today's Date: 11/28/2020   History of Present Illness Sabrina Wheeler is a 75 y.o. female with medical history of systolic and diastolic CHF, of hypertension, diabetes mellitus, hyperlipidemia, CKD presenting with shortness of breath and respiratory distress.  History is limited secondary to the patient being in extremis.  Patient was recently admitted to the hospital from 10/14/2020 to 10/25/2020 secondary to respiratory failure from acute systolic and diastolic CHF.  She was discharged home with furosemide 40 mg daily.  During that hospitalization, the patient was noted to have a drop in her hemoglobin with positive FOBT.  EGD on 10/23/2020 showed nonobstructive Schatzki's ring with small hiatus hernia.  10/23/2020 colonoscopy showed pandiverticulosis with 2 descending colon polyps and nonbleeding internal hemorrhoids.  She was transfused 1 unit PRBC.  She was discharged home with home health.  Prior to hospitalization, the patient required BiPAP as well as nitroglycerin drip.  She was on IV heparin for 48 hours.  She was not felt to be a good candidate for heart catheterization secondary to her renal insufficiency.    PT Comments    Patient does not require assist with bed mobility and demonstrates good sitting balance and sitting tolerance EOB while completing exercises. Patient ambulates increased distance today with use of RW without loss of balance. Returned to bed at end of session. Patient will benefit from continued physical therapy in hospital and recommended venue below to increase strength, balance, endurance for safe ADLs and gait.    Recommendations for follow up therapy are one component of a multi-disciplinary discharge planning process, led by the attending physician.  Recommendations may be updated based on patient status, additional functional criteria and insurance authorization.  Follow  Up Recommendations  Home health PT;Supervision for mobility/OOB     Equipment Recommendations  None recommended by PT    Recommendations for Other Services       Precautions / Restrictions Precautions Precautions: Fall Restrictions Weight Bearing Restrictions: No     Mobility  Bed Mobility Overal bed mobility: Modified Independent                  Transfers Overall transfer level: Needs assistance Equipment used: Rolling walker (2 wheeled) Transfers: Sit to/from [employer] Transfers Sit to Stand: Min guard;Supervision Stand pivot transfers: Min guard;Supervision       General transfer comment: transfer to standing with RW  Ambulation/Gait Ambulation/Gait assistance: Min guard;Supervision Gait Distance (Feet): 140 Feet Assistive device: Rolling walker (2 wheeled) Gait Pattern/deviations: Step-through pattern;Decreased stride length Gait velocity: decreased   General Gait Details: slow, labored cadence without loss of balance   Stairs             Wheelchair Mobility    Modified Rankin (Stroke Patients Only)       Balance Overall balance assessment: Needs assistance Sitting-balance support: No upper extremity supported;Feet supported Sitting balance-Leahy Scale: Good Sitting balance - Comments: seated EOB   Standing balance support: Bilateral upper extremity supported Standing balance-Leahy Scale: Fair Standing balance comment: with RW                            Cognition Arousal/Alertness: Awake/alert Behavior During Therapy: WFL for tasks assessed/performed Overall Cognitive Status: Within Functional Limits for tasks assessed  Exercises General Exercises - Lower Extremity Ankle Circles/Pumps: AROM;Both;15 reps;Seated Long Arc Quad: AROM;Both;15 reps;Seated Hip Flexion/Marching: AROM;Both;15 reps;Seated    General Comments        Pertinent Vitals/Pain  Pain Assessment: No/denies pain    Home Living                      Prior Function            PT Goals (current goals can now be found in the care plan section) Acute Rehab PT Goals Patient Stated Goal: return home and be able to walk more PT Goal Formulation: With patient Time For Goal Achievement: 12/11/20 Potential to Achieve Goals: Good Progress towards PT goals: Progressing toward goals    Frequency    Min 3X/week      PT Plan Current plan remains appropriate    Co-evaluation              AM-PAC PT "6 Clicks" Mobility   Outcome Measure  Help needed turning from your back to your side while in a flat bed without using bedrails?: None Help needed moving from lying on your back to sitting on the side of a flat bed without using bedrails?: None Help needed moving to and from a bed to a chair (including a wheelchair)?: A Little Help needed standing up from a chair using your arms (e.g., wheelchair or bedside chair)?: A Little Help needed to walk in hospital room?: A Little Help needed climbing 3-5 steps with a railing? : A Lot 6 Click Score: 19    End of Session Equipment Utilized During Treatment: Gait belt Activity Tolerance: Patient tolerated treatment well;Patient limited by fatigue Patient left: with call bell/phone within reach;in bed;with bed alarm set Nurse Communication: Mobility status PT Visit Diagnosis: Unsteadiness on feet (R26.81);Other abnormalities of gait and mobility (R26.89);Muscle weakness (generalized) (M62.81)     Time: 8159-4707 PT Time Calculation (min) (ACUTE ONLY): 18 min  Charges:  $Therapeutic Exercise: 8-22 mins                    12:22 PM, 11/28/20 Mearl Latin PT, DPT Physical Therapist at [facility]

## 2020-11-28 NOTE — TOC Transition Note (Signed)
Transition of Care [facility]) - CM/SW Discharge Note   Patient Details  Name: Sabrina Wheeler MRN: [id number] Date of Birth: 01-Oct-1945  [facility] CM/SW Contact:  Shade Flood, LCSW Phone Number: 11/28/2020, 12:08 PM   Clinical Narrative:     Pt medically stable for dc today per MD. Spoke with pt's daughter by phone to review dc planning. Addressed daughter's questions and provided information on Medication application for possible ALF care for pt and possibly her husband as well.  At this time, daughter requests dc home with continued [facility] RN and PT and she is agreeable to add CSW as well. Pt will continue to have assistance from an aide at home four hours per day.  Updated Linda at [facility] of pt's dc. Per Vaughan Basta, they can add CSW and they will follow up with pt at home.  Updated MD. There are no other TOC needs for dc.  Final next level of care: [place] Barriers to Discharge: Barriers Resolved   Patient Goals and CMS Choice Patient states their goals for this hospitalization and ongoing recovery are:: Home with [facility] CMS Medicare.gov Compare Post Acute Care list provided to:: Patient Represenative (must comment) Choice offered to / list presented to : Adult Children  Discharge Placement                       Discharge Plan and Services In-house Referral: Clinical Social Work Discharge Planning Services: CM Consult Post Acute Care Choice: Home Health                    HH Arranged: PT [place]: [place] Date [place]: 11/25/20 Time [place]: 2993 Representative spoke with at [place]: aaron  Social Determinants of Health ([place]) Interventions     Readmission Risk Interventions Readmission Risk Prevention Plan 11/28/2020 11/25/2020 10/16/2020  Transportation Screening Complete Complete Complete  Home Care Screening - Complete Complete  Medication Review (RN CM) - Complete Complete  HRI or Home Care  Consult Complete - -  Social Work Consult for [place] Planning/Counseling Complete - -  Palliative Care Screening Not Applicable - -  Medication Review (RN Care Manager) Complete - -  Some recent data might be hidden

## 2020-11-28 NOTE — Discharge Summary (Signed)
Physician Discharge Summary  DANYIEL CRESPIN DTO:[id number] DOB: 01/12/1946 DOA: 11/25/2020  PCP: [facility] date: 11/25/2020  Discharge date: 11/28/2020  Admitted From:Home  Disposition:  Home  Recommendations for Outpatient Follow-up:  Follow up with PCP in 1-2 weeks Follow-up with cardiology as scheduled on 9/27 at 1 PM Continue on Imdur and Lasix as prescribed below Continue other home medications as prior  Home Health: Yes with RN, PT, SW  Equipment/Devices: None  Discharge Condition:Stable  CODE STATUS: Full  Diet recommendation: Heart Healthy/carb modified  Brief/Interim Summary:  Sabrina Wheeler is a 75 y.o. female with medical history of systolic and diastolic CHF, of hypertension, diabetes mellitus, hyperlipidemia, CKD presenting with shortness of breath and respiratory distress.  Patient has been admitted with acute hypoxemic respiratory failure in the setting of acute on chronic diastolic congestive heart failure exacerbation.  This has been a recurrent problem for her requiring multiple readmissions.  Therefore, cardiology was consulted to assist with management and her home medications have been adjusted to include Imdur daily as well as Lasix 40 mg twice daily as opposed to once daily.  She has diuresed to her dry weight of approximately 173 pounds and is now stable for discharge.  Family members will work on placement to ALF in the near future and will be set up with home health services.  Discharge Diagnoses:  Active Problems:   Essential hypertension   Mixed hyperlipidemia   Acute respiratory failure with hypoxia (HCC)   Acute on chronic combined systolic and diastolic CHF (congestive heart failure) (HCC)   CKD (chronic kidney disease) stage 4, GFR 15-29 ml/min (HCC)  Principal discharge diagnosis: Acute on chronic combined systolic and diastolic heart failure exacerbation.  Discharge Instructions  Discharge Instructions      Diet - low sodium heart healthy   Complete by: As directed    If the dressing is still on your incision site when you go home, remove it on the third day after your surgery date. Remove dressing if it begins to fall off, or if it is dirty or damaged before the third day.   Complete by: As directed    Increase activity slowly   Complete by: As directed       Allergies as of 11/28/2020       Reactions   Macrobid [nitrofurantoin Monohyd Macro] Other (See Comments)   Pt states "it paralyzed me"   Sulfamethoxazole-trimethoprim Other (See Comments)   "paralyzed me"   Doxycycline Nausea And Vomiting   Clindamycin/lincomycin Rash        Medication List     TAKE these medications    aspirin EC 81 MG tablet Take 1 tablet (81 mg total) by mouth daily. Swallow whole.   atorvastatin 80 MG tablet Commonly known as: LIPITOR Take 1 tablet by mouth daily.   Calcium Carb-Cholecalciferol 600-800 MG-UNIT Tabs Take 1 tablet by mouth at bedtime.   carvedilol 12.5 MG tablet Commonly known as: COREG Take 1 tablet (12.5 mg total) by mouth 2 (two) times daily with a meal.   donepezil 5 MG tablet Commonly known as: ARICEPT Take 5 mg by mouth daily.   escitalopram 10 MG tablet Commonly known as: LEXAPRO Take 10 mg by mouth daily.   ferrous sulfate 325 (65 FE) MG EC tablet Take 1 tablet (325 mg total) by mouth 2 (two) times daily.   furosemide 40 MG tablet Commonly known as: LASIX Take 1 tablet (40 mg total) by mouth  2 (two) times daily. What changed: when to take this   hydrALAZINE 25 MG tablet Commonly known as: APRESOLINE Take 1 tablet (25 mg total) by mouth every 8 (eight) hours.   isosorbide mononitrate 30 MG 24 hr tablet Commonly known as: IMDUR Take 0.5 tablets (15 mg total) by mouth daily. Start taking on: November 29, 2020   multivitamin with minerals tablet Take 1 tablet by mouth daily.   pantoprazole 40 MG tablet Commonly known as: PROTONIX Take 1 tablet (40  mg total) by mouth daily.   polyethylene glycol 17 g packet Commonly known as: MIRALAX / GLYCOLAX Take 17 g by mouth daily as needed for mild constipation.   sodium bicarbonate 650 MG tablet Take 650 mg by mouth 2 (two) times daily.   triamcinolone ointment 0.1 % Commonly known as: KENALOG Apply 1 application topically 2 (two) times daily.   vitamin B-12 500 MCG tablet Commonly known as: CYANOCOBALAMIN Take 1 tablet (500 mcg total) by mouth daily.   Vitamin C CR 1000 MG Tbcr Take 500 mg by mouth daily.               Discharge Care Instructions  (From admission, onward)           Start     Ordered   11/28/20 0000  If the dressing is still on your incision site when you go home, remove it on the third day after your surgery date. Remove dressing if it begins to fall off, or if it is dirty or damaged before the third day.        11/28/20 1236            Follow-up Information     Imogene Burn, PA-C Follow up on 12/02/2020.   Specialty: Cardiology Why: Keep scheduled Cardiology Follow-up for 12/02/2020 at 1:00 PM. Will be in the [place]. Contact information: [place] [address] [phone]         [facility] Schedule an appointment as soon as possible for a visit in 2 week(s).   Contact information: [address] [place] [address] [phone]                Allergies  Allergen Reactions   Macrobid [Nitrofurantoin Monohyd Macro] Other (See Comments)    Pt states "it paralyzed me"   Sulfamethoxazole-Trimethoprim Other (See Comments)    "paralyzed me"   Doxycycline Nausea And Vomiting   Clindamycin/Lincomycin Rash    Consultations: Cardiology   Procedures/Studies: DG Chest 2 View  Result Date: 11/11/2020 CLINICAL DATA:  Recent weight gain, history of CHF EXAM: CHEST - 2 VIEW COMPARISON:  Chest radiograph 10/15/2020 FINDINGS: The heart is enlarged, unchanged. The mediastinal  contours are stable. Patchy opacity seen throughout the lungs on the study of 10/15/2020 have improved. There is mild vascular congestion without overt pulmonary edema on the current study. There is no focal airspace disease. There is no significant pleural effusion. There is no pneumothorax. There is multilevel degenerative change of the thoracic spine with diffuse idiopathic skeletal hyperostosis. There is no acute osseous abnormality. IMPRESSION: 1. Overall improved aeration compared to the study of 10/15/2020 favored to reflect resolved pulmonary edema. 2. No focal airspace disease or overt pulmonary edema on the current study. Electronically Signed   By: Valetta Mole M.D.   On: 11/11/2020 16:12   DG Chest Portable 1 View  Result Date: 11/25/2020 CLINICAL DATA:  75 year old female with severe shortness of  breath this morning. EXAM: PORTABLE CHEST 1 VIEW COMPARISON:  Chest radiographs 11/11/2020 and earlier. FINDINGS: Portable AP upright view at 0625 hours. Lower lung volumes. Stable mild cardiomegaly. Other mediastinal contours are within normal limits. Visualized tracheal air column is within normal limits. Increased bilateral basilar predominant indistinct pulmonary interstitial opacity. Increased blunting of the right costophrenic angle. No superimposed pneumothorax or consolidation. No acute osseous abnormality identified. Paucity of bowel gas in the upper abdomen. IMPRESSION: Mild cardiomegaly with increased pulmonary interstitial opacity and blunting of the right costophrenic angle from earlier this month. Favor acute interstitial edema with small right pleural effusion. Viral/atypical respiratory infection felt less likely. Electronically Signed   By: Genevie Ann M.D.   On: 11/25/2020 06:52     Discharge Exam: Vitals:   11/27/20 2103 11/28/20 0517  BP: (!) 135/43 (!) 143/58  Pulse: (!) 56 (!) 57  Resp: 18 13  Temp: 97.9 F (36.6 C) 98.6 F (37 C)  SpO2: 95% 94%   Vitals:   11/27/20 1318  11/27/20 2103 11/28/20 0517 11/28/20 0705  BP: (!) 133/30 (!) 135/43 (!) 143/58   Pulse: (!) 57 (!) 56 (!) 57   Resp: 16 18 13    Temp: 98.2 F (36.8 C) 97.9 F (36.6 C) 98.6 F (37 C)   TempSrc: Oral Oral Oral   SpO2: 97% 95% 94%   Weight:    78.6 kg  Height:        General: Pt is alert, awake, not in acute distress Cardiovascular: RRR, S1/S2 +, no rubs, no gallops Respiratory: CTA bilaterally, no wheezing, no rhonchi Abdominal: Soft, NT, ND, bowel sounds + Extremities: no edema, no cyanosis    The results of significant diagnostics from this hospitalization (including imaging, microbiology, ancillary and laboratory) are listed below for reference.     Microbiology: Recent Results (from the past 240 hour(s))  Resp Panel by RT-PCR (Flu A&B, Covid) Nasopharyngeal Swab     Status: None   Collection Time: 11/25/20  5:59 AM   Specimen: Nasopharyngeal Swab; Nasopharyngeal(NP) swabs in vial transport medium  Result Value Ref Range Status   SARS Coronavirus 2 by RT PCR NEGATIVE NEGATIVE Final    Comment: (NOTE) SARS-CoV-2 target nucleic acids are NOT DETECTED.  The SARS-CoV-2 RNA is generally detectable in upper respiratory specimens during the acute phase of infection. The lowest concentration of SARS-CoV-2 viral copies this assay can detect is 138 copies/mL. A negative result does not preclude SARS-Cov-2 infection and should not be used as the sole basis for treatment or other patient management decisions. A negative result may occur with  improper specimen collection/handling, submission of specimen other than nasopharyngeal swab, presence of viral mutation(s) within the areas targeted by this assay, and inadequate number of viral copies(<138 copies/mL). A negative result must be combined with clinical observations, patient history, and epidemiological information. The expected result is Negative.  Fact Sheet for Patients:  EntrepreneurPulse.com.au  Fact  Sheet for Healthcare Providers:  IncredibleEmployment.be  This test is no t yet approved or cleared by the [place] FDA and  has been authorized for detection and/or diagnosis of SARS-CoV-2 by FDA under an Emergency Use Authorization (EUA). This EUA will remain  in effect (meaning this test can be used) for the duration of the COVID-19 declaration under Section 564(b)(1) of the Act, 21 U.S.C.section 360bbb-3(b)(1), unless the authorization is terminated  or revoked sooner.       Influenza A by PCR NEGATIVE NEGATIVE Final   Influenza B by PCR NEGATIVE  NEGATIVE Final    Comment: (NOTE) The Xpert Xpress SARS-CoV-2/FLU/RSV plus assay is intended as an aid in the diagnosis of influenza from Nasopharyngeal swab specimens and should not be used as a sole basis for treatment. Nasal washings and aspirates are unacceptable for Xpert Xpress SARS-CoV-2/FLU/RSV testing.  Fact Sheet for Patients: EntrepreneurPulse.com.au  Fact Sheet for Healthcare Providers: IncredibleEmployment.be  This test is not yet approved or cleared by the [place] FDA and has been authorized for detection and/or diagnosis of SARS-CoV-2 by FDA under an Emergency Use Authorization (EUA). This EUA will remain in effect (meaning this test can be used) for the duration of the COVID-19 declaration under Section 564(b)(1) of the Act, 21 U.S.C. section 360bbb-3(b)(1), unless the authorization is terminated or revoked.  Performed at [facility], [address] [place], [place] [address]   Urine Culture     Status: None   Collection Time: 11/25/20  8:46 AM   Specimen: Urine, Catheterized  Result Value Ref Range Status   Specimen Description   Final    URINE, CATHETERIZED Performed at [facility], [address], [place], [place] [address]    Special Requests   Final    NONE Performed at [facility], [address], [place], [place] [address]    Culture    Final    NO GROWTH Performed at [facility], [place] [address] [place], [place] [address]    Report Status 11/26/2020 FINAL  Final  MRSA Next Gen by PCR, Nasal     Status: None   Collection Time: 11/25/20 10:10 PM   Specimen: Nasal Mucosa; Nasal Swab  Result Value Ref Range Status   MRSA by PCR Next Gen NOT DETECTED NOT DETECTED Final    Comment: (NOTE) The GeneXpert MRSA Assay (FDA approved for NASAL specimens only), is one component of a comprehensive MRSA colonization surveillance program. It is not intended to diagnose MRSA infection nor to guide or monitor treatment for MRSA infections. Test performance is not FDA approved in patients less than 9 years old. Performed at [facility], [address], [place], [place] [address]      Labs: BNP (last 3 results) Recent Labs    10/15/20 0018 11/11/20 1554 11/25/20 0611  BNP 527.0* 826.0* 563.1*   Basic Metabolic Panel: Recent Labs  Lab 11/25/20 0611 11/26/20 0434 11/27/20 0537 11/28/20 0546  NA 137 139 138 138  K 4.8 3.7 3.4* 3.7  CL 110 109 107 107  CO2 22 22 23 23   GLUCOSE 191* 92 115* 108*  BUN 36* 38* 40* 39*  CREATININE 3.01* 2.92* 2.90* 2.87*  CALCIUM 7.5* 8.0* 7.7* 8.0*  MG  --   --  1.7 1.7   Liver Function Tests: Recent Labs  Lab 11/25/20 0611  AST 25  ALT 18  ALKPHOS 83  BILITOT 0.8  PROT 6.5  ALBUMIN 3.0*   No results for input(s): LIPASE, AMYLASE in the last 168 hours. No results for input(s): AMMONIA in the last 168 hours. CBC: Recent Labs  Lab 11/25/20 0611 11/27/20 0537  WBC 12.7* 7.4  NEUTROABS 10.5*  --   HGB 10.1* 8.3*  HCT 31.8* 26.3*  MCV 94.9 93.3  PLT 249 207   Cardiac Enzymes: No results for input(s): CKTOTAL, CKMB, CKMBINDEX, TROPONINI in the last 168 hours. BNP: Invalid input(s): POCBNP CBG: No results for input(s): GLUCAP in the last 168 hours. D-Dimer No results for input(s): DDIMER in the last 72 hours. Hgb A1c No results for input(s):  HGBA1C in the last  72 hours. Lipid Profile No results for input(s): CHOL, HDL, LDLCALC, TRIG, CHOLHDL, LDLDIRECT in the last 72 hours. Thyroid function studies No results for input(s): TSH, T4TOTAL, T3FREE, THYROIDAB in the last 72 hours.  Invalid input(s): FREET3 Anemia work up No results for input(s): VITAMINB12, FOLATE, FERRITIN, TIBC, IRON, RETICCTPCT in the last 72 hours. Urinalysis    Component Value Date/Time   COLORURINE YELLOW 11/25/2020 0846   APPEARANCEUR CLEAR 11/25/2020 0846   LABSPEC 1.020 11/25/2020 0846   PHURINE 5.5 11/25/2020 0846   GLUCOSEU NEGATIVE 11/25/2020 0846   HGBUR NEGATIVE 11/25/2020 0846   BILIRUBINUR NEGATIVE 11/25/2020 0846   KETONESUR NEGATIVE 11/25/2020 0846   PROTEINUR 100 (A) 11/25/2020 0846   NITRITE NEGATIVE 11/25/2020 0846   LEUKOCYTESUR NEGATIVE 11/25/2020 0846   Sepsis Labs Invalid input(s): PROCALCITONIN,  WBC,  LACTICIDVEN Microbiology Recent Results (from the past 240 hour(s))  Resp Panel by RT-PCR (Flu A&B, Covid) Nasopharyngeal Swab     Status: None   Collection Time: 11/25/20  5:59 AM   Specimen: Nasopharyngeal Swab; Nasopharyngeal(NP) swabs in vial transport medium  Result Value Ref Range Status   SARS Coronavirus 2 by RT PCR NEGATIVE NEGATIVE Final    Comment: (NOTE) SARS-CoV-2 target nucleic acids are NOT DETECTED.  The SARS-CoV-2 RNA is generally detectable in upper respiratory specimens during the acute phase of infection. The lowest concentration of SARS-CoV-2 viral copies this assay can detect is 138 copies/mL. A negative result does not preclude SARS-Cov-2 infection and should not be used as the sole basis for treatment or other patient management decisions. A negative result may occur with  improper specimen collection/handling, submission of specimen other than nasopharyngeal swab, presence of viral mutation(s) within the areas targeted by this assay, and inadequate number of viral copies(<138 copies/mL). A negative result must be  combined with clinical observations, patient history, and epidemiological information. The expected result is Negative.  Fact Sheet for Patients:  EntrepreneurPulse.com.au  Fact Sheet for Healthcare Providers:  IncredibleEmployment.be  This test is no t yet approved or cleared by the [place] FDA and  has been authorized for detection and/or diagnosis of SARS-CoV-2 by FDA under an Emergency Use Authorization (EUA). This EUA will remain  in effect (meaning this test can be used) for the duration of the COVID-19 declaration under Section 564(b)(1) of the Act, 21 U.S.C.section 360bbb-3(b)(1), unless the authorization is terminated  or revoked sooner.       Influenza A by PCR NEGATIVE NEGATIVE Final   Influenza B by PCR NEGATIVE NEGATIVE Final    Comment: (NOTE) The Xpert Xpress SARS-CoV-2/FLU/RSV plus assay is intended as an aid in the diagnosis of influenza from Nasopharyngeal swab specimens and should not be used as a sole basis for treatment. Nasal washings and aspirates are unacceptable for Xpert Xpress SARS-CoV-2/FLU/RSV testing.  Fact Sheet for Patients: EntrepreneurPulse.com.au  Fact Sheet for Healthcare Providers: IncredibleEmployment.be  This test is not yet approved or cleared by the [place] FDA and has been authorized for detection and/or diagnosis of SARS-CoV-2 by FDA under an Emergency Use Authorization (EUA). This EUA will remain in effect (meaning this test can be used) for the duration of the COVID-19 declaration under Section 564(b)(1) of the Act, 21 U.S.C. section 360bbb-3(b)(1), unless the authorization is terminated or revoked.  Performed at [facility], [address], [place], [place] [address]   Urine Culture     Status: None   Collection Time: 11/25/20  8:46 AM   Specimen: Urine,  Catheterized  Result Value Ref Range Status   Specimen Description   Final    URINE,  CATHETERIZED Performed at [facility], [address] [place], [place] [address]    Special Requests   Final    NONE Performed at [facility], [address] [place], [place] [address]    Culture   Final    NO GROWTH Performed at [facility], [place] [address] [place], [place] [address]    Report Status 11/26/2020 FINAL  Final  MRSA Next Gen by PCR, Nasal     Status: None   Collection Time: 11/25/20 10:10 PM   Specimen: Nasal Mucosa; Nasal Swab  Result Value Ref Range Status   MRSA by PCR Next Gen NOT DETECTED NOT DETECTED Final    Comment: (NOTE) The GeneXpert MRSA Assay (FDA approved for NASAL specimens only), is one component of a comprehensive MRSA colonization surveillance program. It is not intended to diagnose MRSA infection nor to guide or monitor treatment for MRSA infections. Test performance is not FDA approved in patients less than 40 years old. Performed at [facility], [address] [place], [place] [address]      Time coordinating discharge: 35 minutes  SIGNED:   Rodena Goldmann, DO [facility] 11/28/2020, 12:36 PM  If 7PM-7AM, please contact night-coverage [email or web address]

## 2020-11-28 NOTE — Care Management Important Message (Signed)
Important Message  Patient Details  Name: Sabrina Wheeler MRN: [id number] Date of Birth: 11-21-1945   Medicare Important Message Given:  Yes     Tommy Medal 11/28/2020, 12:01 PM

## 2020-12-02 ENCOUNTER — Ambulatory Visit: Payer: Medicare Other | Admitting: Physician Assistant

## 2020-12-15 ENCOUNTER — Emergency Department (HOSPITAL_COMMUNITY): Payer: Medicare Other

## 2020-12-15 ENCOUNTER — Other Ambulatory Visit: Payer: Self-pay

## 2020-12-15 ENCOUNTER — Encounter (HOSPITAL_COMMUNITY): Payer: Self-pay | Admitting: Emergency Medicine

## 2020-12-15 DIAGNOSIS — I214 Non-ST elevation (NSTEMI) myocardial infarction: Principal | ICD-10-CM

## 2020-12-15 DIAGNOSIS — J9601 Acute respiratory failure with hypoxia: Secondary | ICD-10-CM | POA: Diagnosis present

## 2020-12-15 DIAGNOSIS — Z515 Encounter for palliative care: Secondary | ICD-10-CM

## 2020-12-15 DIAGNOSIS — Z79899 Other long term (current) drug therapy: Secondary | ICD-10-CM | POA: Diagnosis not present

## 2020-12-15 DIAGNOSIS — F0280 Dementia in other diseases classified elsewhere without behavioral disturbance: Secondary | ICD-10-CM | POA: Diagnosis present

## 2020-12-15 DIAGNOSIS — L89321 Pressure ulcer of left buttock, stage 1: Secondary | ICD-10-CM | POA: Diagnosis present

## 2020-12-15 DIAGNOSIS — J189 Pneumonia, unspecified organism: Secondary | ICD-10-CM | POA: Diagnosis present

## 2020-12-15 DIAGNOSIS — I130 Hypertensive heart and chronic kidney disease with heart failure and stage 1 through stage 4 chronic kidney disease, or unspecified chronic kidney disease: Secondary | ICD-10-CM | POA: Diagnosis present

## 2020-12-15 DIAGNOSIS — I255 Ischemic cardiomyopathy: Secondary | ICD-10-CM | POA: Diagnosis present

## 2020-12-15 DIAGNOSIS — G309 Alzheimer's disease, unspecified: Secondary | ICD-10-CM | POA: Diagnosis present

## 2020-12-15 DIAGNOSIS — D72829 Elevated white blood cell count, unspecified: Secondary | ICD-10-CM | POA: Diagnosis not present

## 2020-12-15 DIAGNOSIS — I5043 Acute on chronic combined systolic (congestive) and diastolic (congestive) heart failure: Secondary | ICD-10-CM | POA: Diagnosis not present

## 2020-12-15 DIAGNOSIS — R778 Other specified abnormalities of plasma proteins: Secondary | ICD-10-CM | POA: Diagnosis not present

## 2020-12-15 DIAGNOSIS — D638 Anemia in other chronic diseases classified elsewhere: Secondary | ICD-10-CM

## 2020-12-15 DIAGNOSIS — I10 Essential (primary) hypertension: Secondary | ICD-10-CM | POA: Diagnosis present

## 2020-12-15 DIAGNOSIS — I2510 Atherosclerotic heart disease of native coronary artery without angina pectoris: Secondary | ICD-10-CM

## 2020-12-15 DIAGNOSIS — Z881 Allergy status to other antibiotic agents status: Secondary | ICD-10-CM

## 2020-12-15 DIAGNOSIS — Z882 Allergy status to sulfonamides status: Secondary | ICD-10-CM

## 2020-12-15 DIAGNOSIS — Z7982 Long term (current) use of aspirin: Secondary | ICD-10-CM | POA: Diagnosis not present

## 2020-12-15 DIAGNOSIS — N184 Chronic kidney disease, stage 4 (severe): Secondary | ICD-10-CM | POA: Diagnosis present

## 2020-12-15 DIAGNOSIS — E1122 Type 2 diabetes mellitus with diabetic chronic kidney disease: Secondary | ICD-10-CM | POA: Diagnosis present

## 2020-12-15 DIAGNOSIS — Z7189 Other specified counseling: Secondary | ICD-10-CM | POA: Diagnosis not present

## 2020-12-15 DIAGNOSIS — I472 Ventricular tachycardia, unspecified: Secondary | ICD-10-CM | POA: Diagnosis present

## 2020-12-15 DIAGNOSIS — R7989 Other specified abnormal findings of blood chemistry: Secondary | ICD-10-CM

## 2020-12-15 DIAGNOSIS — Z66 Do not resuscitate: Secondary | ICD-10-CM | POA: Diagnosis present

## 2020-12-15 DIAGNOSIS — E782 Mixed hyperlipidemia: Secondary | ICD-10-CM | POA: Diagnosis present

## 2020-12-15 DIAGNOSIS — I509 Heart failure, unspecified: Secondary | ICD-10-CM

## 2020-12-15 DIAGNOSIS — Z20822 Contact with and (suspected) exposure to covid-19: Secondary | ICD-10-CM | POA: Diagnosis present

## 2020-12-15 DIAGNOSIS — R739 Hyperglycemia, unspecified: Secondary | ICD-10-CM

## 2020-12-15 DIAGNOSIS — L89311 Pressure ulcer of right buttock, stage 1: Secondary | ICD-10-CM | POA: Diagnosis present

## 2020-12-15 DIAGNOSIS — I5021 Acute systolic (congestive) heart failure: Secondary | ICD-10-CM | POA: Diagnosis not present

## 2020-12-15 DIAGNOSIS — K219 Gastro-esophageal reflux disease without esophagitis: Secondary | ICD-10-CM

## 2020-12-15 DIAGNOSIS — D631 Anemia in chronic kidney disease: Secondary | ICD-10-CM | POA: Diagnosis present

## 2020-12-15 DIAGNOSIS — E8809 Other disorders of plasma-protein metabolism, not elsewhere classified: Secondary | ICD-10-CM | POA: Diagnosis present

## 2020-12-15 DIAGNOSIS — Z955 Presence of coronary angioplasty implant and graft: Secondary | ICD-10-CM

## 2020-12-15 DIAGNOSIS — E1165 Type 2 diabetes mellitus with hyperglycemia: Secondary | ICD-10-CM | POA: Diagnosis present

## 2020-12-15 DIAGNOSIS — Z8249 Family history of ischemic heart disease and other diseases of the circulatory system: Secondary | ICD-10-CM

## 2020-12-15 DIAGNOSIS — I252 Old myocardial infarction: Secondary | ICD-10-CM

## 2020-12-15 HISTORY — DX: Other fecal abnormalities: R19.5

## 2020-12-15 HISTORY — DX: Atherosclerotic heart disease of native coronary artery without angina pectoris: I25.10

## 2020-12-15 HISTORY — DX: Diaphragmatic hernia without obstruction or gangrene: K44.9

## 2020-12-15 HISTORY — DX: Anemia, unspecified: D64.9

## 2020-12-15 HISTORY — DX: Dementia in other diseases classified elsewhere, unspecified severity, without behavioral disturbance, psychotic disturbance, mood disturbance, and anxiety: F02.80

## 2020-12-15 HISTORY — DX: Esophageal obstruction: K22.2

## 2020-12-15 HISTORY — DX: Chronic combined systolic (congestive) and diastolic (congestive) heart failure: I50.42

## 2020-12-15 HISTORY — DX: Chronic kidney disease, stage 4 (severe): N18.4

## 2020-12-15 LAB — COMPREHENSIVE METABOLIC PANEL
ALT: 18 U/L (ref 0–44)
AST: 23 U/L (ref 15–41)
Albumin: 3.1 g/dL — ABNORMAL LOW (ref 3.5–5.0)
Alkaline Phosphatase: 73 U/L (ref 38–126)
Anion gap: 8 (ref 5–15)
BUN: 45 mg/dL — ABNORMAL HIGH (ref 8–23)
CO2: 23 mmol/L (ref 22–32)
Calcium: 8.1 mg/dL — ABNORMAL LOW (ref 8.9–10.3)
Chloride: 108 mmol/L (ref 98–111)
Creatinine, Ser: 2.52 mg/dL — ABNORMAL HIGH (ref 0.44–1.00)
GFR, Estimated: 19 mL/min — ABNORMAL LOW (ref >60)
Glucose, Bld: 157 mg/dL — ABNORMAL HIGH (ref 70–99)
Potassium: 3.6 mmol/L (ref 3.5–5.1)
Sodium: 139 mmol/L (ref 135–145)
Total Bilirubin: 0.6 mg/dL (ref 0.3–1.2)
Total Protein: 6.3 g/dL — ABNORMAL LOW (ref 6.5–8.1)

## 2020-12-15 LAB — CBC WITH DIFFERENTIAL/PLATELET
Abs Immature Granulocytes: 0.06 10*3/uL (ref 0.00–0.07)
Basophils Absolute: 0.1 10*3/uL (ref 0.0–0.1)
Basophils Relative: 1 %
Eosinophils Absolute: 0 10*3/uL (ref 0.0–0.5)
Eosinophils Relative: 0 %
HCT: 29.8 % — ABNORMAL LOW (ref 36.0–46.0)
Hemoglobin: 9.4 g/dL — ABNORMAL LOW (ref 12.0–15.0)
Immature Granulocytes: 1 %
Lymphocytes Relative: 7 %
Lymphs Abs: 0.9 10*3/uL (ref 0.7–4.0)
MCH: 29.5 pg (ref 26.0–34.0)
MCHC: 31.5 g/dL (ref 30.0–36.0)
MCV: 93.4 fL (ref 80.0–100.0)
Monocytes Absolute: 0.5 10*3/uL (ref 0.1–1.0)
Monocytes Relative: 4 %
Neutro Abs: 10.5 10*3/uL — ABNORMAL HIGH (ref 1.7–7.7)
Neutrophils Relative %: 87 %
Platelets: 222 10*3/uL (ref 150–400)
RBC: 3.19 MIL/uL — ABNORMAL LOW (ref 3.87–5.11)
RDW: 15.2 % (ref 11.5–15.5)
WBC: 11.9 10*3/uL — ABNORMAL HIGH (ref 4.0–10.5)
nRBC: 0 % (ref 0.0–0.2)

## 2020-12-15 LAB — BLOOD GAS, ARTERIAL
Acid-base deficit: 4 mmol/L — ABNORMAL HIGH (ref 0.0–2.0)
Bicarbonate: 21 mmol/L (ref 20.0–28.0)
FIO2: 40
O2 Saturation: 94.6 %
Patient temperature: 37
pCO2 arterial: 39.9 mmHg (ref 32.0–48.0)
pH, Arterial: 7.337 — ABNORMAL LOW (ref 7.350–7.450)
pO2, Arterial: 85.1 mmHg (ref 83.0–108.0)

## 2020-12-15 LAB — RESP PANEL BY RT-PCR (FLU A&B, COVID) ARPGX2
Influenza A by PCR: NEGATIVE
Influenza B by PCR: NEGATIVE
SARS Coronavirus 2 by RT PCR: NEGATIVE

## 2020-12-15 LAB — TROPONIN I (HIGH SENSITIVITY)
Troponin I (High Sensitivity): 558 ng/L (ref <18)
Troponin I (High Sensitivity): 1172 ng/L (ref <18)
Troponin I (High Sensitivity): 2277 ng/L (ref <18)

## 2020-12-15 LAB — BRAIN NATRIURETIC PEPTIDE: B Natriuretic Peptide: 719 pg/mL — ABNORMAL HIGH (ref 0.0–100.0)

## 2020-12-15 MED ORDER — PANTOPRAZOLE SODIUM 40 MG PO TBEC: 40.0000 mg | DELAYED_RELEASE_TABLET | Freq: Every day | ORAL | Status: DC

## 2020-12-15 MED ORDER — ATORVASTATIN CALCIUM 40 MG PO TABS: 80.0000 mg | ORAL_TABLET | Freq: Every day | ORAL | Status: DC

## 2020-12-15 MED ORDER — ASPIRIN 325 MG PO TABS: 325.0000 mg | ORAL_TABLET | Freq: Once | ORAL | Status: DC

## 2020-12-15 MED ORDER — ENOXAPARIN SODIUM 80 MG/0.8ML IJ SOSY
1.0000 mg/kg | PREFILLED_SYRINGE | INTRAMUSCULAR | Status: DC
  Administered 2020-12-15 – 2020-12-16 (×2): 80 mg via SUBCUTANEOUS
  Filled 2020-12-15 (×2): qty 0.8

## 2020-12-15 MED ORDER — ONDANSETRON HCL 4 MG/2ML IJ SOLN
4.0000 mg | Freq: Once | INTRAMUSCULAR | Status: AC
  Administered 2020-12-15: 4 mg via INTRAVENOUS
  Filled 2020-12-15: qty 2

## 2020-12-15 MED ORDER — FUROSEMIDE 10 MG/ML IJ SOLN
40.0000 mg | Freq: Once | INTRAMUSCULAR | Status: AC
  Administered 2020-12-15: 40 mg via INTRAVENOUS
  Filled 2020-12-15: qty 4

## 2020-12-15 MED ORDER — VITAMIN B-12 1000 MCG PO TABS: 500.0000 ug | ORAL_TABLET | Freq: Every day | ORAL | Status: DC

## 2020-12-15 MED ORDER — ASPIRIN EC 81 MG PO TBEC: 81.0000 mg | DELAYED_RELEASE_TABLET | Freq: Every day | ORAL | Status: DC

## 2020-12-15 MED ORDER — FERROUS SULFATE 325 (65 FE) MG PO TABS: 325.0000 mg | ORAL_TABLET | Freq: Two times a day (BID) | ORAL | Status: DC

## 2020-12-15 MED ORDER — ASPIRIN 300 MG RE SUPP
300.0000 mg | Freq: Once | RECTAL | Status: AC
  Administered 2020-12-15: 300 mg via RECTAL
  Filled 2020-12-15 (×2): qty 1

## 2020-12-15 MED ORDER — CARVEDILOL 12.5 MG PO TABS: 12.5000 mg | ORAL_TABLET | Freq: Two times a day (BID) | ORAL | Status: DC

## 2020-12-15 MED ORDER — GLUCERNA SHAKE PO LIQD
237.0000 mL | Freq: Three times a day (TID) | ORAL | Status: DC
  Filled 2020-12-15 (×6): qty 237

## 2020-12-15 MED ORDER — NITROGLYCERIN IN D5W 200-5 MCG/ML-% IV SOLN
5.0000 ug/min | INTRAVENOUS | Status: AC
  Administered 2020-12-15: 10 ug/min via INTRAVENOUS
  Administered 2020-12-16: 5 ug/min via INTRAVENOUS
  Filled 2020-12-15 (×2): qty 250

## 2020-12-15 MED ORDER — FUROSEMIDE 10 MG/ML IJ SOLN
40.0000 mg | Freq: Two times a day (BID) | INTRAMUSCULAR | Status: DC
  Administered 2020-12-16 – 2020-12-17 (×3): 40 mg via INTRAVENOUS
  Filled 2020-12-15 (×3): qty 4

## 2020-12-15 NOTE — ED Notes (Signed)
Date and time results received: 01/02/2021 2040  Test: Troponin  Critical Value: 1172  Name of Provider Notified: Adefeso   Orders Received? Or Actions Taken?:

## 2020-12-15 NOTE — H&P (Signed)
History and Physical  DODI LEU QIH:[id number] DOB: 1945-10-03 DOA: 12/21/2020  Referring physician: Denton Meek, MD  PCP: [facility]  Patient coming from: Home  Chief Complaint: Chest Pain   HPI: Sabrina Wheeler is a 75 y.o. female with medical history significant for HTN, hyperlipidemia, O7FI, systolic and diastolic CHF, CKD stage IV who presents to the emergency department via EMS due to complaint of chest pain.  Patient denies chest pain at bedside and also denies shortness of breath despite being on a BiPAP.  Rest of the history was obtained from ED physician and ED medical record.  Per report, chest pain which started today was described as burning and aching like sensation, this was associated with some shortness of breath and it was of moderate intensity with no aggravating/aggravating factor.  She was also reported to have vomited today, EMS was activated, and on arrival of EMS team, patient was noted to have O2 sats in low 80s on room air and she was placed on supplemental oxygen via [place] at 2 LPM with improved oxygenation, she was then transferred to ED for further evaluation and management.  ED Course:  In the emergency department, patient was intermittently tachypneic, BP was 174/72 and O2 sat was 88% on room air and this momentarily improved with supplemental oxygen.  Shortly after being in the ED, patient's O2 sat became desaturated and was in respiratory distress, she was placed on BiPAP with improved work of breathing. Work-up in the ED showed leukocytosis and normocytic anemia, BUN/creatinine at 45/2.52 (baseline at 2.6-3.0), BNP 719, troponin x1 558, albumin 3.1.  Influenza A, B, SARS coronavirus 2 was negative. Chest x-ray showed findings most consistent with mild congestive heart failure.  Superimposed infection will be difficult to exclude She was treated with IV Lasix 40 mg x 2, patient was started on IV nitroglycerin drip, IV Zofran was  given. Cardiologist at [facility] Dr. Margaretann Loveless was consulted and recommended admitting patient to [facility] and had a cardiology group with consult on her on arrival to [facility] per ED physician.  Review of Systems: Constitutional: Negative for chills and fever.  HENT: Negative for ear pain and sore throat.   Eyes: Negative for pain and visual disturbance.  Respiratory: Positive for shortness of breath.  Negative for cough Cardiovascular: Positive for chest pain (resolved) and negative for palpitations.  Gastrointestinal: Negative for abdominal pain and vomiting.  Endocrine: Negative for polyphagia and polyuria.  Genitourinary: Negative for decreased urine volume, dysuria, enuresis Musculoskeletal: Negative for arthralgias and back pain.  Skin: Negative for color change and rash.  Allergic/Immunologic: Negative for immunocompromised state.  Neurological: Negative for tremors, syncope, speech difficulty Hematological: Does not bruise/bleed easily.  All other systems reviewed and are negative   Past Medical History:  Diagnosis Date   Broken ankle 2008   left    CHF (congestive heart failure) (HCC)    Diabetes mellitus without complication ([place])    Hypertension    Obesity    Renal disorder    Stage 4 kidney disease   Past Surgical History:  Procedure Laterality Date   BILE DUCT STENT PLACEMENT     BIOPSY  10/23/2020   Procedure: BIOPSY;  Surgeon: Daneil Dolin, MD;  Location: [facility];  Service: Endoscopy;;   COLONOSCOPY WITH PROPOFOL N/A 10/23/2020   Procedure: COLONOSCOPY WITH PROPOFOL;  Surgeon: Daneil Dolin, MD;  Location: [facility];  Service: Endoscopy;  Laterality: N/A;   ESOPHAGOGASTRODUODENOSCOPY (EGD)  WITH PROPOFOL N/A 10/23/2020   Procedure: ESOPHAGOGASTRODUODENOSCOPY (EGD) WITH PROPOFOL;  Surgeon: Daneil Dolin, MD;  Location: [facility];  Service: Endoscopy;  Laterality: N/A;   GIVENS CAPSULE STUDY N/A 10/24/2020   Procedure: GIVENS CAPSULE STUDY;  Surgeon:  Daneil Dolin, MD;  Location: [facility];  Service: Endoscopy;  Laterality: N/A;   INCISIONAL HERNIA REPAIR     POLYPECTOMY  10/23/2020   Procedure: POLYPECTOMY;  Surgeon: Daneil Dolin, MD;  Location: [facility];  Service: Endoscopy;;    Social History:  reports that she has never smoked. She has never used smokeless tobacco. She reports that she does not drink alcohol and does not use drugs.   Allergies  Allergen Reactions   Macrobid [Nitrofurantoin Monohyd Macro] Other (See Comments)    Pt states "it paralyzed me"   Sulfamethoxazole-Trimethoprim Other (See Comments)    "paralyzed me"   Doxycycline Nausea And Vomiting   Clindamycin/Lincomycin Rash    Family History  Problem Relation Age of Onset   Cancer Mother    Heart disease Father    Heart attack Father      Prior to Admission medications   Medication Sig Start Date End Date Taking? Authorizing Provider  Ascorbic Acid (VITAMIN C CR) 1000 MG TBCR Take 500 mg by mouth daily. 06/13/20  Yes [provider]  aspirin EC 81 MG tablet Take 1 tablet (81 mg total) by mouth daily. Swallow whole. 10/25/20  Yes Kathie Dike, MD  atorvastatin (LIPITOR) 80 MG tablet Take 1 tablet by mouth daily. 09/25/18  Yes [provider]  Calcium Carb-Cholecalciferol 600-800 MG-UNIT TABS Take 1 tablet by mouth at bedtime.   Yes [provider]  carvedilol (COREG) 12.5 MG tablet Take 1 tablet (12.5 mg total) by mouth 2 (two) times daily with a meal. 10/25/20  Yes Memon, Jolaine Artist, MD  donepezil (ARICEPT) 5 MG tablet Take 5 mg by mouth daily. 10/01/20  Yes [provider]  escitalopram (LEXAPRO) 10 MG tablet Take 10 mg by mouth daily.   Yes [provider]  ferrous sulfate 325 (65 FE) MG EC tablet Take 1 tablet (325 mg total) by mouth 2 (two) times daily. 10/25/20 10/25/21 Yes Kathie Dike, MD  furosemide (LASIX) 40 MG tablet Take 1 tablet (40 mg total) by mouth 2 (two) times daily. 11/28/20 12/28/20 Yes  Shah, Pratik D, DO  isosorbide mononitrate (IMDUR) 30 MG 24 hr tablet Take 0.5 tablets (15 mg total) by mouth daily. 11/29/20 12/29/20 Yes Shah, Pratik D, DO  Multiple Vitamins-Minerals (MULTIVITAMIN WITH MINERALS) tablet Take 1 tablet by mouth daily.   Yes [provider]  pantoprazole (PROTONIX) 40 MG tablet Take 1 tablet (40 mg total) by mouth daily. 10/25/20  Yes Kathie Dike, MD  polyethylene glycol (MIRALAX / GLYCOLAX) 17 g packet Take 17 g by mouth daily as needed for mild constipation. 10/25/20  Yes Kathie Dike, MD  sodium bicarbonate 650 MG tablet Take 650 mg by mouth 2 (two) times daily. 10/01/20  Yes [provider]  triamcinolone ointment (KENALOG) 0.1 % Apply 1 application topically 2 (two) times daily.   Yes [provider]  vitamin B-12 (CYANOCOBALAMIN) 500 MCG tablet Take 1 tablet (500 mcg total) by mouth daily. 10/25/20  Yes Kathie Dike, MD  FEROSUL 325 (65 Fe) MG tablet Take by mouth. Patient not taking: No sig reported 11/28/20   [provider]  hydrALAZINE (APRESOLINE) 25 MG tablet Take 1 tablet (25 mg total) by mouth every 8 (  eight) hours. Patient not taking: Reported on 01/01/2021 10/25/20   Kathie Dike, MD    Physical Exam: BP 137/60   Pulse (!) 58   Temp 97.9 F (36.6 C)   Resp 14   Ht 5\' 2"  (1.575 m)   Wt 79 kg   SpO2 100%   BMI 31.85 kg/m   General: 75 y.o. year-old female well developed well nourished on BiPAP and in no acute distress.  Alert and oriented x3. HEENT: NCAT, EOMI Neck: Supple, trachea medial Cardiovascular: Regular rate and rhythm with no rubs or gallops.  No thyromegaly or JVD noted.  No lower extremity edema. 2/4 pulses in all 4 extremities. Respiratory: Clear to auscultation with no wheezes or rales. Good inspiratory effort. Abdomen: Soft, nontender nondistended with normal bowel sounds x4 quadrants. Muskuloskeletal: No cyanosis, clubbing or edema noted bilaterally Neuro: CN II-XII intact,  strength 5/5 x 4, sensation, reflexes intact Skin: No ulcerative lesions noted or rashes Psychiatry: Mood is appropriate for condition and setting          Labs on Admission:  Basic Metabolic Panel: Recent Labs  Lab 12/24/2020 1739  NA 139  K 3.6  CL 108  CO2 23  GLUCOSE 157*  BUN 45*  CREATININE 2.52*  CALCIUM 8.1*   Liver Function Tests: Recent Labs  Lab 12/29/2020 1739  AST 23  ALT 18  ALKPHOS 73  BILITOT 0.6  PROT 6.3*  ALBUMIN 3.1*   No results for input(s): LIPASE, AMYLASE in the last 168 hours. No results for input(s): AMMONIA in the last 168 hours. CBC: Recent Labs  Lab 12/16/2020 1739  WBC 11.9*  NEUTROABS 10.5*  HGB 9.4*  HCT 29.8*  MCV 93.4  PLT 222   Cardiac Enzymes: No results for input(s): CKTOTAL, CKMB, CKMBINDEX, TROPONINI in the last 168 hours.  BNP (last 3 results) Recent Labs    11/11/20 1554 11/25/20 0611 12/24/2020 1739  BNP 826.0* 674.0* 719.0*    ProBNP (last 3 results) No results for input(s): PROBNP in the last 8760 hours.  CBG: No results for input(s): GLUCAP in the last 168 hours.  Radiological Exams on Admission: DG Chest Port 1 View  Result Date: 12/21/2020 CLINICAL DATA:  Burning chest pain, intermittent vomiting today EXAM: PORTABLE CHEST 1 VIEW COMPARISON:  11/25/2020 FINDINGS: Single frontal view of the chest demonstrates a stable cardiac silhouette. There is chronic central vascular congestion. Patchy bilateral areas of consolidation are most consistent with mild edema or multifocal pneumonia. No effusion or pneumothorax. No acute bony abnormalities. IMPRESSION: 1. Findings most consistent with mild congestive heart failure. Superimposed infection would be difficult to exclude. Electronically Signed   By: Randa Ngo M.D.   On: 12/17/2020 17:26    EKG: I independently viewed the EKG done and my findings are as followed: Sinus or ectopic atrial rhythm at rate of 72 bpm with prolonged QTc (519  ms)  Assessment/Plan Present on Admission:  Acute respiratory failure with hypoxia (HCC)  Essential hypertension  Mixed hyperlipidemia  CKD (chronic kidney disease) stage 4, GFR 15-29 ml/min (HCC)  Principal Problem:   Acute exacerbation of CHF (congestive heart failure) (HCC) Active Problems:   Essential hypertension   Mixed hyperlipidemia   Acute respiratory failure with hypoxia (HCC)   Anemia of chronic disease   Elevated troponin I level   CKD (chronic kidney disease) stage 4, GFR 15-29 ml/min (HCC)   NSTEMI (non-ST elevated myocardial infarction) (HCC)   Elevated brain natriuretic peptide (BNP) level   Hypoalbuminemia  Hyperglycemia   Leukocytosis   CAD (coronary artery disease)   GERD (gastroesophageal reflux disease)   Acute exacerbation of CHF Acute respiratory failure with hypoxia requiring NIPPV in the setting of above Chest x-ray was suggestive of mild CHF IV Lasix 40 mg x2 was provided Patient required BiPAP due to respiratory distress Continue total input/output, daily weights and fluid restriction Continue IV Lasix 40 twice daily (as tolerated by BP) Continue Cardiac diet  Echocardiogram done on 10/15/2020 shown below.  Echocardiogram will be done in the morning ABG will be checked Continue BiPAP with plan to wean patient off this and transition to supplemental oxygen and with goal to wean patient off supplemental oxygen as tolerated (patient does not use oxygen at baseline) Cardiology at [facility] was consulted and recommended transferring patient to [facility] and cardiology team will consult on patient by [facility] ED physician  Elevated BNP (519 ms) This may be secondary to above, though this may not be reliable considering elevated patient's kidney function status Continue treatment as described above  Elevated troponin possibly secondary to NSTEMI Chest pain  Troponin x1- 558 > 1,172, continue to trend troponin Continue telemetry, aspirin was given EKG personally  reviewed showed sinus or ectopic atrial rhythm at rate of 72 bpm with prolonged QTc (519 ms) Patient was started on IV nitroglycerin drip  Therapeutic Lovenox was given per pharmacy consult Continue aspirin 81 mg daily Cardiology was consulted and recommended admitting patient to [facility] with plan to consult on patient on arrival to [facility] per ED physician    Hyperglycemia  CBG 157, no diabetic medication noted in patient's med rec Last A1c on 10/15/2020 was 5.9 Continue diet modification  Leukocytosis possibly reactive WBC 11.9, chest x-ray suggestive of possible superimposed infection Procalcitonin will be checked prior to starting antibiotics  Hypoalbuminemia possibly secondary to mild protein calorie malnutrition Albumin 3.1, protein supplements will be provided  Essential hypertension Continue IV Lasix as indicated above for acute exacerbation of CHF  Mixed hyperlipidemia Continue Lipitor  CAD Continue aspirin, Lipitor Imdur if temporarily held since patient is currently on IV nitroglycerin drip  CKD stage IV BUN/creatinine at 45/2.52 (baseline at 2.6-3.0) Renally adjust medications, avoid nephrotoxic agents/dehydration/hypotension  GERD Continue Protonix  Anemia of chronic disease Continue ferrous sulfate, vitamin B12   Echocardiogram done on 10/15/2020  IMPRESSIONS     1. Compared to echo report from 2017, LVEF is down and wall motion changes are new.   2. There is hypokinesis of the lateral wall, distal anterior and akinesis of the distal inferior, distal inferolateral, distal inferoseptal and apical walls . Left ventricular ejection fraction, by estimation, is 45%. The left ventricular internal  cavity size was mildly dilated. There is mild left ventricular hypertrophy. Left ventricular diastolic parameters are consistent with Grade II diastolic dysfunction (pseudonormalization).   3. Right ventricular systolic function is low normal. The right ventricular  size is normal. There is moderately elevated pulmonary artery systolic pressure.   4. Left atrial size was moderately dilated.   5. Mild mitral valve regurgitation.   6. The aortic valve is tricuspid. Aortic valve regurgitation is not visualized. Mild to moderate aortic valve sclerosis/calcification is present, without any evidence of aortic stenosis.   7. The inferior vena cava is dilated in size with >50% respiratory variability, suggesting right atrial pressure of 8 mmHg.   DVT prophylaxis: Lovenox  Code Status: Full code  Family Communication: None at bedside  Disposition Plan:  Patient is from:  home Anticipated DC to:                   SNF or family members home Anticipated DC date:               2-3 days Anticipated DC barriers:          Patient requires inpatient management due to acute evaluation of CHF and elevated troponin possibly due to NSTEMI pending cardiology consult  Consults called: Cardiology  Admission status: Inpatient    Bernadette Hoit MD [facility]  12/11/2020, 9:58 PM

## 2020-12-15 NOTE — Progress Notes (Signed)
[place] for Lovenox Indication: chest pain/ACS  Labs: Recent Labs    12/30/2020 1739  HGB 9.4*  HCT 29.8*  PLT 222  CREATININE 2.52*    Assessment: 26 yof presenting with CP, elevated troponin. Pharmacy consulted to dose Lovenox for ACS. Patient to transfer to [facility] for Cardiology consult. Patient is not on anticoagulation PTA. Hg 9.4, plt wnl. SCr 2.52 on presentation (appears to be at baseline), CrCl <30. No active bleed issues reported.  Goal of Therapy:  Anti-Xa level 0.6-1 units/ml 4hrs after LMWH dose given Monitor platelets by anticoagulation protocol: Yes   Plan:  Start Lovenox 80mg  [place] q24h Monitor CBC at least q72h, SCr, s/sx bleeding F/u Cardiology plans  Arturo Morton, PharmD, BCPS Clinical Pharmacist 12/19/2020 8:50 PM

## 2020-12-15 NOTE — ED Triage Notes (Signed)
Pt to ER via EMS from home with c/o chest pain described as burning.  This is second time she has called EMS today.  Pt has had vomiting intermittently today.  Reports of EMS O2 sats low 80s, placed on 2L [place] and pt is feeling better.

## 2020-12-15 NOTE — ED Notes (Signed)
Pt desating, reports difficulty breathing and noted rales.  Pt oxygen increased at this time and Dr. Roderic Palau made aware.  Awaiting new orders.  Will monitor.

## 2020-12-15 NOTE — ED Provider Notes (Signed)
[facility] EMERGENCY DEPARTMENT Provider Note   CSN: [id number] Arrival date & time: 12/21/2020  1522     History Chief Complaint  Patient presents with   Chest Pain    Sabrina Wheeler is a 75 y.o. female.  Patient has a history of diabetes hypertension congestive heart failure and has a stent in her LAD of her heart.  Patient states she has been having some chest discomfort off and on today and some shortness of breath.  Initially when she was seen she was no longer having any pain and was not terribly dyspneic  The history is provided by the patient and medical records. No language interpreter was used.  Chest Pain Pain location:  L chest Pain quality: aching   Pain radiates to:  Does not radiate Pain severity:  Moderate Onset quality:  Sudden Timing:  Intermittent Progression:  Waxing and waning Chronicity:  New Context: not breathing   Relieved by:  Nothing Worsened by:  Nothing Ineffective treatments:  None tried Associated symptoms: no abdominal pain, no back pain, no cough, no fatigue and no headache       Past Medical History:  Diagnosis Date   Broken ankle 2008   left    CHF (congestive heart failure) (HCC)    Diabetes mellitus without complication (HCC)    Hypertension    Obesity    Renal disorder    Stage 4 kidney disease    Patient Active Problem List   Diagnosis Date Noted   Acute exacerbation of CHF (congestive heart failure) ([place]) 12/07/2020   Acute on chronic combined systolic and diastolic CHF (congestive heart failure) ([place]) 11/25/2020   CKD (chronic kidney disease) stage 4, GFR 15-29 ml/min (HCC) 53/61/4431   Systolic and diastolic CHF, acute ([place]) 10/16/2020   Chest pain 10/15/2020   Acute kidney injury superimposed on chronic kidney disease ([place]) 10/15/2020   Acute respiratory failure with hypoxia (HCC) 10/15/2020   Anemia    Elevated troponin    Sepsis due to undetermined organism ([place]) 11/30/2018   Essential hypertension 11/30/2018    Mixed hyperlipidemia 11/30/2018   Cellulitis of lower extremity    Hypokalemia    Ischemic ulcer ([place]) 11/08/2013   Atherosclerosis of native arteries of the extremities with ulceration(440.23) 11/08/2013    Past Surgical History:  Procedure Laterality Date   BILE DUCT STENT PLACEMENT     BIOPSY  10/23/2020   Procedure: BIOPSY;  Surgeon: Daneil Dolin, MD;  Location: [facility];  Service: Endoscopy;;   COLONOSCOPY WITH PROPOFOL N/A 10/23/2020   Procedure: COLONOSCOPY WITH PROPOFOL;  Surgeon: Daneil Dolin, MD;  Location: [facility];  Service: Endoscopy;  Laterality: N/A;   ESOPHAGOGASTRODUODENOSCOPY (EGD) WITH PROPOFOL N/A 10/23/2020   Procedure: ESOPHAGOGASTRODUODENOSCOPY (EGD) WITH PROPOFOL;  Surgeon: Daneil Dolin, MD;  Location: [facility];  Service: Endoscopy;  Laterality: N/A;   GIVENS CAPSULE STUDY N/A 10/24/2020   Procedure: GIVENS CAPSULE STUDY;  Surgeon: Daneil Dolin, MD;  Location: [facility];  Service: Endoscopy;  Laterality: N/A;   INCISIONAL HERNIA REPAIR     POLYPECTOMY  10/23/2020   Procedure: POLYPECTOMY;  Surgeon: Daneil Dolin, MD;  Location: [facility];  Service: Endoscopy;;     OB History   No obstetric history on file.     Family History  Problem Relation Age of Onset   Cancer Mother    Heart disease Father    Heart attack Father     Social History  Tobacco Use   Smoking status: Never   Smokeless tobacco: Never  Vaping Use   Vaping Use: Never used  Substance Use Topics   Alcohol use: No   Drug use: No    Home Medications Prior to Admission medications   Medication Sig Start Date End Date Taking? Authorizing Provider  Ascorbic Acid (VITAMIN C CR) 1000 MG TBCR Take 500 mg by mouth daily. 06/13/20  Yes [provider]  aspirin EC 81 MG tablet Take 1 tablet (81 mg total) by mouth daily. Swallow whole. 10/25/20  Yes Kathie Dike, MD  atorvastatin (LIPITOR) 80 MG tablet Take 1 tablet by mouth daily. 09/25/18  Yes  [provider]  Calcium Carb-Cholecalciferol 600-800 MG-UNIT TABS Take 1 tablet by mouth at bedtime.   Yes [provider]  carvedilol (COREG) 12.5 MG tablet Take 1 tablet (12.5 mg total) by mouth 2 (two) times daily with a meal. 10/25/20  Yes Memon, Jolaine Artist, MD  donepezil (ARICEPT) 5 MG tablet Take 5 mg by mouth daily. 10/01/20  Yes [provider]  escitalopram (LEXAPRO) 10 MG tablet Take 10 mg by mouth daily.   Yes [provider]  ferrous sulfate 325 (65 FE) MG EC tablet Take 1 tablet (325 mg total) by mouth 2 (two) times daily. 10/25/20 10/25/21 Yes Kathie Dike, MD  furosemide (LASIX) 40 MG tablet Take 1 tablet (40 mg total) by mouth 2 (two) times daily. 11/28/20 12/28/20 Yes Shah, Pratik D, DO  isosorbide mononitrate (IMDUR) 30 MG 24 hr tablet Take 0.5 tablets (15 mg total) by mouth daily. 11/29/20 12/29/20 Yes Shah, Pratik D, DO  Multiple Vitamins-Minerals (MULTIVITAMIN WITH MINERALS) tablet Take 1 tablet by mouth daily.   Yes [provider]  pantoprazole (PROTONIX) 40 MG tablet Take 1 tablet (40 mg total) by mouth daily. 10/25/20  Yes Kathie Dike, MD  polyethylene glycol (MIRALAX / GLYCOLAX) 17 g packet Take 17 g by mouth daily as needed for mild constipation. 10/25/20  Yes Kathie Dike, MD  sodium bicarbonate 650 MG tablet Take 650 mg by mouth 2 (two) times daily. 10/01/20  Yes [provider]  triamcinolone ointment (KENALOG) 0.1 % Apply 1 application topically 2 (two) times daily.   Yes [provider]  vitamin B-12 (CYANOCOBALAMIN) 500 MCG tablet Take 1 tablet (500 mcg total) by mouth daily. 10/25/20  Yes Kathie Dike, MD  FEROSUL 325 (65 Fe) MG tablet Take by mouth. Patient not taking: No sig reported 11/28/20   [provider]  hydrALAZINE (APRESOLINE) 25 MG tablet Take 1 tablet (25 mg total) by mouth every 8 (eight) hours. Patient not taking: Reported on 12/17/2020 10/25/20   Kathie Dike, MD     Allergies    Macrobid [nitrofurantoin monohyd macro], Sulfamethoxazole-trimethoprim, Doxycycline, and Clindamycin/lincomycin  Review of Systems   Review of Systems  Constitutional:  Negative for appetite change and fatigue.  HENT:  Negative for congestion, ear discharge and sinus pressure.   Eyes:  Negative for discharge.  Respiratory:  Negative for cough.   Cardiovascular:  Positive for chest pain.  Gastrointestinal:  Negative for abdominal pain and diarrhea.  Genitourinary:  Negative for frequency and hematuria.  Musculoskeletal:  Negative for back pain.  Skin:  Negative for rash.  Neurological:  Negative for seizures and headaches.  Psychiatric/Behavioral:  Negative for hallucinations.    Physical Exam Updated Vital Signs BP (!) 173/61   Pulse 77   Temp 97.9 F (36.6 C)   Resp (!) 23   Ht 5\' 2"  (  1.575 m)   Wt 79 kg   SpO2 95%   BMI 31.85 kg/m   Physical Exam Vitals and nursing note reviewed.  Constitutional:      Appearance: She is well-developed.  HENT:     Head: Normocephalic.     Mouth/Throat:     Mouth: Mucous membranes are moist.  Eyes:     General: No scleral icterus.    Conjunctiva/sclera: Conjunctivae normal.  Neck:     Thyroid: No thyromegaly.  Cardiovascular:     Rate and Rhythm: Normal rate and regular rhythm.     Heart sounds: No murmur heard.   No friction rub. No gallop.  Pulmonary:     Breath sounds: No stridor. No wheezing or rales.  Chest:     Chest wall: No tenderness.  Abdominal:     General: There is no distension.     Tenderness: There is no abdominal tenderness. There is no rebound.  Musculoskeletal:        General: Normal range of motion.     Cervical back: Neck supple.  Lymphadenopathy:     Cervical: No cervical adenopathy.  Skin:    Findings: No erythema or rash.  Neurological:     Mental Status: She is alert and oriented to person, place, and time.     Motor: No abnormal muscle tone.     Coordination: Coordination  normal.  Psychiatric:        Behavior: Behavior normal.    ED Results / Procedures / Treatments   Labs (all labs ordered are listed, but only abnormal results are displayed) Labs Reviewed  CBC WITH DIFFERENTIAL/PLATELET - Abnormal; Notable for the following components:      Result Value   WBC 11.9 (*)    RBC 3.19 (*)    Hemoglobin 9.4 (*)    HCT 29.8 (*)    Neutro Abs 10.5 (*)    All other components within normal limits  COMPREHENSIVE METABOLIC PANEL - Abnormal; Notable for the following components:   Glucose, Bld 157 (*)    BUN 45 (*)    Creatinine, Ser 2.52 (*)    Calcium 8.1 (*)    Total Protein 6.3 (*)    Albumin 3.1 (*)    GFR, Estimated 19 (*)    All other components within normal limits  BRAIN NATRIURETIC PEPTIDE - Abnormal; Notable for the following components:   B Natriuretic Peptide 719.0 (*)    All other components within normal limits  TROPONIN I (HIGH SENSITIVITY) - Abnormal; Notable for the following components:   Troponin I (High Sensitivity) 558 (*)    All other components within normal limits  RESP PANEL BY RT-PCR (FLU A&B, COVID) [id number]  TROPONIN I (HIGH SENSITIVITY)    EKG EKG Interpretation  Date/Time:  Monday December 15 2020 16:36:55 EDT Ventricular Rate:  72 PR Interval:  297 QRS Duration: 110 QT Interval:  474 QTC Calculation: 519 R Axis:   -34 Text Interpretation: Sinus or ectopic atrial rhythm Prolonged PR interval LVH with secondary repolarization abnormality Anterior Q waves, possibly due to LVH Prolonged QT interval Confirmed by Milton Ferguson [phone]) on 12/27/2020 4:48:03 PM  Radiology DG Chest Port 1 View  Result Date: 12/23/2020 CLINICAL DATA:  Burning chest pain, intermittent vomiting today EXAM: PORTABLE CHEST 1 VIEW COMPARISON:  11/25/2020 FINDINGS: Single frontal view of the chest demonstrates a stable cardiac silhouette. There is chronic central vascular congestion. Patchy bilateral areas of consolidation are most consistent  with mild edema or multifocal  pneumonia. No effusion or pneumothorax. No acute bony abnormalities. IMPRESSION: 1. Findings most consistent with mild congestive heart failure. Superimposed infection would be difficult to exclude. Electronically Signed   By: Randa Ngo M.D.   On: 12/11/2020 17:26    Procedures Procedures   Medications Ordered in ED Medications  nitroGLYCERIN 50 mg in dextrose 5 % 250 mL (0.2 mg/mL) infusion (10 mcg/min Intravenous New Bag/Given 12/17/2020 1835)  ondansetron (ZOFRAN) injection 4 mg (4 mg Intravenous Given 12/06/2020 1734)  furosemide (LASIX) injection 40 mg (40 mg Intravenous Given 12/06/2020 1821)  furosemide (LASIX) injection 40 mg (40 mg Intravenous Given 12/11/2020 1835)  CRITICAL CARE Performed by: Milton Ferguson Total critical care time: 40 minutes Critical care time was exclusive of separately billable procedures and treating other patients. Critical care was necessary to treat or prevent imminent or life-threatening deterioration. Critical care was time spent personally by me on the following activities: development of treatment plan with patient and/or surrogate as well as nursing, discussions with consultants, evaluation of patient's response to treatment, examination of patient, obtaining history from patient or surrogate, ordering and performing treatments and interventions, ordering and review of laboratory studies, ordering and review of radiographic studies, pulse oximetry and re-evaluation of patient's condition.  Patient started become very short of breath.  Chest x-ray showed congestive heart failure.  She was in moderate to severe congestive heart failure.  Patient was put on BiPAP along with a nitro drip and she was doing much better.  No longer in respiratory distress.  Her troponin came back 558.  I spoke with cardiologist Dr.Acharya and she stated to have the hospitalist admit the patient over at [facility] and the cardiologist group will  consult on her. ED Course  I have reviewed the triage vital signs and the nursing notes.  Pertinent labs & imaging results that were available during my care of the patient were reviewed by me and considered in my medical decision making (see chart for details).    MDM Rules/Calculators/A&P                           Patient with NSTEMI and congestive heart failure she will be admitted over [facility].  Patient presently is on BiPAP but doing well Final Clinical Impression(s) / ED Diagnoses Final diagnoses:  NSTEMI (non-ST elevated myocardial infarction) [facility])    Rx / [place] Orders ED Discharge Orders     None        Milton Ferguson, MD 12/09/2020 2004

## 2020-12-16 ENCOUNTER — Inpatient Hospital Stay (HOSPITAL_COMMUNITY): Payer: Medicare Other

## 2020-12-16 ENCOUNTER — Encounter (HOSPITAL_COMMUNITY): Payer: Self-pay | Admitting: Internal Medicine

## 2020-12-16 DIAGNOSIS — I5043 Acute on chronic combined systolic (congestive) and diastolic (congestive) heart failure: Secondary | ICD-10-CM | POA: Diagnosis not present

## 2020-12-16 DIAGNOSIS — I5021 Acute systolic (congestive) heart failure: Secondary | ICD-10-CM | POA: Diagnosis not present

## 2020-12-16 DIAGNOSIS — I214 Non-ST elevation (NSTEMI) myocardial infarction: Secondary | ICD-10-CM | POA: Diagnosis not present

## 2020-12-16 LAB — CBC
HCT: 27.5 % — ABNORMAL LOW (ref 36.0–46.0)
Hemoglobin: 8.5 g/dL — ABNORMAL LOW (ref 12.0–15.0)
MCH: 29.5 pg (ref 26.0–34.0)
MCHC: 30.9 g/dL (ref 30.0–36.0)
MCV: 95.5 fL (ref 80.0–100.0)
Platelets: 218 10*3/uL (ref 150–400)
RBC: 2.88 MIL/uL — ABNORMAL LOW (ref 3.87–5.11)
RDW: 15.2 % (ref 11.5–15.5)
WBC: 9.4 10*3/uL (ref 4.0–10.5)
nRBC: 0 % (ref 0.0–0.2)

## 2020-12-16 LAB — PROTIME-INR
INR: 1.3 — ABNORMAL HIGH (ref 0.8–1.2)
Prothrombin Time: 16.1 seconds — ABNORMAL HIGH (ref 11.4–15.2)

## 2020-12-16 LAB — ECHOCARDIOGRAM COMPLETE
AR max vel: 1.72 cm2
AV Area VTI: 1.85 cm2
AV Area mean vel: 1.72 cm2
AV Mean grad: 7 mmHg
AV Peak grad: 13.4 mmHg
Ao pk vel: 1.83 m/s
Area-P 1/2: 4.17 cm2
Calc EF: 38.5 %
Height: 62 in
MV VTI: 1.82 cm2
S' Lateral: 4.7 cm
Single Plane A2C EF: 42.5 %
Single Plane A4C EF: 32.6 %
Weight: 2786.61 oz

## 2020-12-16 LAB — TROPONIN I (HIGH SENSITIVITY)
Troponin I (High Sensitivity): 3942 ng/L (ref <18)
Troponin I (High Sensitivity): 5949 ng/L (ref <18)
Troponin I (High Sensitivity): 6904 ng/L (ref <18)

## 2020-12-16 LAB — COMPREHENSIVE METABOLIC PANEL
ALT: 17 U/L (ref 0–44)
AST: 44 U/L — ABNORMAL HIGH (ref 15–41)
Albumin: 2.7 g/dL — ABNORMAL LOW (ref 3.5–5.0)
Alkaline Phosphatase: 62 U/L (ref 38–126)
Anion gap: 7 (ref 5–15)
BUN: 45 mg/dL — ABNORMAL HIGH (ref 8–23)
CO2: 23 mmol/L (ref 22–32)
Calcium: 8.1 mg/dL — ABNORMAL LOW (ref 8.9–10.3)
Chloride: 111 mmol/L (ref 98–111)
Creatinine, Ser: 2.63 mg/dL — ABNORMAL HIGH (ref 0.44–1.00)
GFR, Estimated: 18 mL/min — ABNORMAL LOW (ref >60)
Glucose, Bld: 118 mg/dL — ABNORMAL HIGH (ref 70–99)
Potassium: 3.6 mmol/L (ref 3.5–5.1)
Sodium: 141 mmol/L (ref 135–145)
Total Bilirubin: 0.4 mg/dL (ref 0.3–1.2)
Total Protein: 5.4 g/dL — ABNORMAL LOW (ref 6.5–8.1)

## 2020-12-16 LAB — APTT: aPTT: 35 seconds (ref 24–36)

## 2020-12-16 LAB — PROCALCITONIN: Procalcitonin: <0.1 ng/mL

## 2020-12-16 LAB — PHOSPHORUS: Phosphorus: 5.5 mg/dL — ABNORMAL HIGH (ref 2.5–4.6)

## 2020-12-16 LAB — MAGNESIUM: Magnesium: 1.8 mg/dL (ref 1.7–2.4)

## 2020-12-16 MED ORDER — ONDANSETRON HCL 4 MG/2ML IJ SOLN
4.0000 mg | Freq: Four times a day (QID) | INTRAMUSCULAR | Status: DC | PRN
  Administered 2020-12-16: 4 mg via INTRAVENOUS

## 2020-12-16 MED ORDER — CHLORHEXIDINE GLUCONATE CLOTH 2 % EX PADS
6.0000 | MEDICATED_PAD | Freq: Every day | CUTANEOUS | Status: DC
  Administered 2020-12-16 – 2020-12-17 (×2): 6 via TOPICAL

## 2020-12-16 MED ORDER — ONDANSETRON HCL 4 MG/2ML IJ SOLN
INTRAMUSCULAR | Status: AC
  Filled 2020-12-16: qty 2

## 2020-12-16 NOTE — Progress Notes (Addendum)
PROGRESS NOTE    [name]  UJW:[id number] DOB: [birth year] DOA: 12/21/2020 PCP: [facility]   Brief Narrative:   Sabrina Wheeler is a 75 y.o. female with medical history significant for HTN, hyperlipidemia, F6OZ, systolic and diastolic CHF, CKD stage IV who presents to the emergency department via EMS due to complaint of chest pain.  Patient denies chest pain at bedside and also denies shortness of breath despite being on a BiPAP.  She appears to be an unclear historian given her dementia.  She has been admitted with NSTEMI and associated acute on chronic combined CHF with acute hypoxemic respiratory failure.  Given her comorbidities, it has been decided by cardiology to manage her conservatively at this time.  Assessment & Plan:   Principal Problem:   Acute exacerbation of CHF (congestive heart failure) (HCC) Active Problems:   Essential hypertension   Mixed hyperlipidemia   Acute respiratory failure with hypoxia (HCC)   Anemia of chronic disease   Elevated troponin I level   CKD (chronic kidney disease) stage 4, GFR 15-29 ml/min (HCC)   NSTEMI (non-ST elevated myocardial infarction) (HCC)   Elevated brain natriuretic peptide (BNP) level   Hypoalbuminemia   Hyperglycemia   Leukocytosis   CAD (coronary artery disease)   GERD (gastroesophageal reflux disease)   NSTEMI in the setting of known CAD -Prior PCI 2016 -Continue medical management as ordered with full dose Lovenox -Continue nitroglycerin drip and BiPAP -Continue statin, aspirin, and carvedilol -Appreciate ongoing cardiology recommendations  Acute hypoxemic respiratory failure secondary to acute on chronic combined CHF secondary to above -Continue IV Lasix as ordered -Continue beta-blocker -Recent echo 10/15/20 with EF 45% suggesting ischemic cardiomyopathy -Monitor daily weights and strict I's and O's  CKD stage IV -Baseline creatinine two-point 5-3 -Continue to monitor -Not a  candidate for catheterization  Chronic anemia -Continue to monitor -Recent Hemoccult positive and GI work-up noted  Alzheimer's dementia -Appreciate palliative consultation  Hyperglycemia -Last A1c 8/22 5.9% -Continue to monitor -Not currently on diabetes medications  GERD -PPI   DVT prophylaxis: Full dose Lovenox Code Status: DNR Family Communication: Discussed with daughter on phone 10/11, brother at bedside Disposition Plan:  Status is: Inpatient  Remains inpatient appropriate because:Hemodynamically unstable, IV treatments appropriate due to intensity of illness or inability to take PO, and Inpatient level of care appropriate due to severity of illness  Dispo: The patient is from: Home              Anticipated d/c is to: Home              Patient currently is not medically stable to d/c.   Difficult to place patient No  Skin Assessment:  I have examined the patient's skin and I agree with the wound assessment as performed by the wound care RN as outlined below:  Pressure Injury 11/25/20 Buttocks Right;Medial;Mid Stage 2 -  Partial thickness loss of dermis presenting as a shallow open injury with a red, pink wound bed without slough. (Active)  11/25/20 0939  Location: Buttocks  Location Orientation: Right;Medial;Mid  Staging: Stage 2 -  Partial thickness loss of dermis presenting as a shallow open injury with a red, pink wound bed without slough.  Wound Description (Comments):   Present on Admission: Yes     Pressure Injury 11/25/20 Buttocks Left;Medial;Mid Stage 2 -  Partial thickness loss of dermis presenting as a shallow open injury with a red, pink wound bed without slough. (Active)  11/25/20  0941  Location: Buttocks  Location Orientation: Left;Medial;Mid  Staging: Stage 2 -  Partial thickness loss of dermis presenting as a shallow open injury with a red, pink wound bed without slough.  Wound Description (Comments):   Present on Admission:     Consultants:   Cardiology Palliative care  Procedures:  See below  Antimicrobials:  None   Subjective: Patient seen and evaluated today with ongoing confusion while on BiPAP.  This afternoon she was noted to have worsening chest pressure and pain.  Objective: Vitals:   12/16/20 1145 12/16/20 1200 12/16/20 1245 12/16/20 1400  BP: (!) 146/55 (!) 154/57 (!) 153/85 (!) 178/80  Pulse:   72 76  Resp: 16 14 (!) 22   Temp:      TempSrc:      SpO2:   100% 100%  Weight:      Height:        Intake/Output Summary (Last 24 hours) at 12/16/2020 1423 Last data filed at 12/20/2020 2309 Gross per 24 hour  Intake --  Output 650 ml  Net -650 ml   Filed Weights   12/10/2020 1526  Weight: 79 kg    Examination:  General exam: Appears confused Respiratory system: Clear to auscultation. Respiratory effort normal.  Currently on BiPAP FiO2 40% Cardiovascular system: S1 & S2 heard, RRR.  Gastrointestinal system: Abdomen is soft Central nervous system: Alert and awake Extremities: No edema Skin: No significant lesions noted Psychiatry: Flat affect.    Data Reviewed: I have personally reviewed following labs and imaging studies  CBC: Recent Labs  Lab 01/03/2021 1739 12/16/20 0354  WBC 11.9* 9.4  NEUTROABS 10.5*  --   HGB 9.4* 8.5*  HCT 29.8* 27.5*  MCV 93.4 95.5  PLT 222 696   Basic Metabolic Panel: Recent Labs  Lab 12/21/2020 1739 12/16/20 0354  NA 139 141  K 3.6 3.6  CL 108 111  CO2 23 23  GLUCOSE 157* 118*  BUN 45* 45*  CREATININE 2.52* 2.63*  CALCIUM 8.1* 8.1*  MG  --  1.8  PHOS  --  5.5*   GFR: Estimated Creatinine Clearance: 17.7 mL/min (A) (by C-G formula based on SCr of 2.63 mg/dL (H)). Liver Function Tests: Recent Labs  Lab 01/03/2021 1739 12/16/20 0354  AST 23 44*  ALT 18 17  ALKPHOS 73 62  BILITOT 0.6 0.4  PROT 6.3* 5.4*  ALBUMIN 3.1* 2.7*   No results for input(s): LIPASE, AMYLASE in the last 168 hours. No results for input(s): AMMONIA in the last 168  hours. Coagulation Profile: Recent Labs  Lab 12/16/20 0354  INR 1.3*   Cardiac Enzymes: No results for input(s): CKTOTAL, CKMB, CKMBINDEX, TROPONINI in the last 168 hours. BNP (last 3 results) No results for input(s): PROBNP in the last 8760 hours. HbA1C: No results for input(s): HGBA1C in the last 72 hours. CBG: No results for input(s): GLUCAP in the last 168 hours. Lipid Profile: No results for input(s): CHOL, HDL, LDLCALC, TRIG, CHOLHDL, LDLDIRECT in the last 72 hours. Thyroid Function Tests: No results for input(s): TSH, T4TOTAL, FREET4, T3FREE, THYROIDAB in the last 72 hours. Anemia Panel: No results for input(s): VITAMINB12, FOLATE, FERRITIN, TIBC, IRON, RETICCTPCT in the last 72 hours. Sepsis Labs: Recent Labs  Lab 12/16/20 0354  PROCALCITON <0.10    Recent Results (from the past 240 hour(s))  Resp Panel by RT-PCR (Flu A&B, Covid) Nasopharyngeal Swab     Status: None   Collection Time: 12/29/2020  6:25 PM   Specimen: Nasopharyngeal  Swab; Nasopharyngeal(NP) swabs in vial transport medium  Result Value Ref Range Status   SARS Coronavirus 2 by RT PCR NEGATIVE NEGATIVE Final    Comment: (NOTE) SARS-CoV-2 target nucleic acids are NOT DETECTED.  The SARS-CoV-2 RNA is generally detectable in upper respiratory specimens during the acute phase of infection. The lowest concentration of SARS-CoV-2 viral copies this assay can detect is 138 copies/mL. A negative result does not preclude SARS-Cov-2 infection and should not be used as the sole basis for treatment or other patient management decisions. A negative result may occur with  improper specimen collection/handling, submission of specimen other than nasopharyngeal swab, presence of viral mutation(s) within the areas targeted by this assay, and inadequate number of viral copies(<138 copies/mL). A negative result must be combined with clinical observations, patient history, and epidemiological information. The expected  result is Negative.  Fact Sheet for Patients:  EntrepreneurPulse.com.au  Fact Sheet for Healthcare Providers:  IncredibleEmployment.be  This test is no t yet approved or cleared by the [place] FDA and  has been authorized for detection and/or diagnosis of SARS-CoV-2 by FDA under an Emergency Use Authorization (EUA). This EUA will remain  in effect (meaning this test can be used) for the duration of the COVID-19 declaration under Section 564(b)(1) of the Act, 21 U.S.C.section 360bbb-3(b)(1), unless the authorization is terminated  or revoked sooner.       Influenza A by PCR NEGATIVE NEGATIVE Final   Influenza B by PCR NEGATIVE NEGATIVE Final    Comment: (NOTE) The Xpert Xpress SARS-CoV-2/FLU/RSV plus assay is intended as an aid in the diagnosis of influenza from Nasopharyngeal swab specimens and should not be used as a sole basis for treatment. Nasal washings and aspirates are unacceptable for Xpert Xpress SARS-CoV-2/FLU/RSV testing.  Fact Sheet for Patients: EntrepreneurPulse.com.au  Fact Sheet for Healthcare Providers: IncredibleEmployment.be  This test is not yet approved or cleared by the [place] FDA and has been authorized for detection and/or diagnosis of SARS-CoV-2 by FDA under an Emergency Use Authorization (EUA). This EUA will remain in effect (meaning this test can be used) for the duration of the COVID-19 declaration under Section 564(b)(1) of the Act, 21 U.S.C. section 360bbb-3(b)(1), unless the authorization is terminated or revoked.  Performed at [facility], [address], [place], [place] [address]          Radiology Studies: [facility] 1 View  Result Date: 12/26/2020 CLINICAL DATA:  Burning chest pain, intermittent vomiting today EXAM: PORTABLE CHEST 1 VIEW COMPARISON:  11/25/2020 FINDINGS: Single frontal view of the chest demonstrates a stable cardiac silhouette.  There is chronic central vascular congestion. Patchy bilateral areas of consolidation are most consistent with mild edema or multifocal pneumonia. No effusion or pneumothorax. No acute bony abnormalities. IMPRESSION: 1. Findings most consistent with mild congestive heart failure. Superimposed infection would be difficult to exclude. Electronically Signed   By: Randa Ngo M.D.   On: 12/08/2020 17:26        Scheduled Meds:  aspirin EC  81 mg Oral Daily   atorvastatin  80 mg Oral Daily   carvedilol  12.5 mg Oral BID WC   enoxaparin (LOVENOX) injection  1 mg/kg Subcutaneous Q24H   feeding supplement (GLUCERNA SHAKE)  237 mL Oral TID BM   ferrous sulfate  325 mg Oral BID PC   furosemide  40 mg Intravenous Q12H   pantoprazole  40 mg Oral Daily   vitamin B-12  500 mcg Oral Daily   Continuous Infusions:  nitroGLYCERIN Stopped (12/16/20 0555)     LOS: 1 day    Critical care time: 70 minutes.    Merlin Golden Darleen Crocker, DO [facility]  If 7PM-7AM, please contact night-coverage [email or web address] 12/16/2020, 2:23 PM

## 2020-12-16 NOTE — ED Notes (Signed)
Pt repositioned in bed.  Pillow placed under heels.  Dr Manuella Ghazi in to see pt.

## 2020-12-16 NOTE — ED Notes (Signed)
Canceled [facility] transport at this time.

## 2020-12-16 NOTE — Consult Note (Addendum)
Cardiology Consultation:   Patient ID: Sabrina Wheeler MRN: [id number]; DOB: 03/09/1945  Admit date: 12/22/2020 Date of Consult: 12/16/2020  PCP:  [facility] Cardiologist:  Carlyle Dolly, MD        Patient Profile:   Sabrina Wheeler is a 75 y.o. female with a hx of Alzheimer's dementia, CAD (NSTEMI s/p DES to LAD 2016 @ [facility]), chronic combined CHF, CKD stage IV (Dr. Theador Hawthorne), mild carotid artery disease (1-39% 2019), HTN, DM2, GIB 10/2020 (Schatzki's ring and small HH, colon-pan diverticulosis, polyps and nonbleeding ulcers), obesity who is being seen 12/16/2020 for the evaluation of NSTEMI at the request of Dr. Manuella Ghazi.  History of Present Illness:   Per records review, patient has hx of NSTEMI s/p DES to LAD 01/06/15 at [facility] with LVEF 40-45% at that time. Cath also showed 25% dLAD, 25% diagonals, 25% prox, 25% OMs, 25% prox and 50% mRCA. She had normalization of EF in 2017 but more recently had recurrent decline. She was admitted 10/2020 with acute anemia/GIB/FOBT+ and hypoxia. EGD showed nonobstructive Schatzki's ring, small hiatal hernia, otherwise normal. Colonoscopy showed colon polyps, hemorrhoids and diverticulosis. Per GI note, capsule endoscopy was WNL and anemia felt due to decreased iron absorption. During that admission she also had pulmonary edema, AKI on CKD IV, and acute combined CHF with decline in EF to 45% with WMA. She had elevated troponins (peak 243) during admission felt due to demand ischemia. Cath not pursued because of CKD and lack of symptoms but to consider ischemic w/u in the future. She was readmitted 11/2020 with hypoxia requiring BiPAP and was felt to be a poor historian. She was treated with diuresis and meidcal therapy. Dr. Harl Bowie suspected recurrent obstructive corornary disease/ischemia and her advanced renal dysfunction had led to recent worsening issues with clinical HF. She only had one troponin drawn  that admission which was wnl. Dr. Harl Bowie felt in general her advanced renal disease as well as recent issues with anemia/FOBT+ would prohibit cath.  She presented back to the ED yesterday with chest pain and vomiting, found to be recurrently hypoxic in the 80s requiring [place]. I spoke with daughter [name] (bolded phone number under summary) to give details as patient is a poor historian and does not recall why she is here. [name] confirms the patient has memory problems and Alzheimer's dementia at baseline. The patient thinks she is at [facility] and does not know the year. Per [name], the patient developed CP/vomiting yesterday late morning. EMS came out and checked her out but they opted out of going to the hospital at that time. The pain returned and EMS was called back out and transported to the hospital. ED note indicates she was found to be hypoxic in the 80s requiring  and then BiPAP. Admit labs show Cr 2.52 -> 2.63 (improved from 2.87 lat admission) albumin nadir 2.7, AST mildly up at 44,, Hgb 9.4 -> 8.5 (previously 8.3), mild leukocytosis of 11.9, troponin [id number], BNP 719, procalcitonin <0.10, INR 1.3. CXR showed mild CHF. Telemetry shows NSR with brief runs NSVT and AVIR.  Ms. Giacobbe currently states she feels fine without chest pain. She has been started on Lovenox per pharmacy, IV NTG, and IV Lasix (received 80mg  yesterday then 40mg  BID today).   Past Medical History:  Diagnosis Date   Alzheimer's dementia ([place])    Anemia    Broken ankle 2008   left    CAD (coronary artery disease)  Chronic combined systolic and diastolic CHF (congestive heart failure) (HCC)    CKD (chronic kidney disease), stage IV (HCC)    Diabetes mellitus without complication (HCC)    Heme positive stool    Hiatal hernia    Hypertension    Obesity    Renal disorder    Stage 4 kidney disease   Schatzki's ring     Past Surgical History:  Procedure Laterality Date   BILE DUCT STENT PLACEMENT      BIOPSY  10/23/2020   Procedure: BIOPSY;  Surgeon: Daneil Dolin, MD;  [facility];  Service: Endoscopy;;   COLONOSCOPY WITH PROPOFOL N/A 10/23/2020   Procedure: COLONOSCOPY WITH PROPOFOL;  Surgeon: Daneil Dolin, MD;  [facility];  Service: Endoscopy;  Laterality: N/A;   ESOPHAGOGASTRODUODENOSCOPY (EGD) WITH PROPOFOL N/A 10/23/2020   Procedure: ESOPHAGOGASTRODUODENOSCOPY (EGD) WITH PROPOFOL;  Surgeon: Daneil Dolin, MD;  [facility];  Service: Endoscopy;  Laterality: N/A;   GIVENS CAPSULE STUDY N/A 10/24/2020   Procedure: GIVENS CAPSULE STUDY;  Surgeon: Daneil Dolin, MD;  [facility];  Service: Endoscopy;  Laterality: N/A;   INCISIONAL HERNIA REPAIR     POLYPECTOMY  10/23/2020   Procedure: POLYPECTOMY;  Surgeon: Daneil Dolin, MD;  [facility];  Service: Endoscopy;;     Home Medications:  Prior to Admission medications   Medication Sig Start Date End Date Taking? Authorizing Provider  Ascorbic Acid (VITAMIN C CR) 1000 MG TBCR Take 500 mg by mouth daily. 06/13/20  Yes [provider]  aspirin EC 81 MG tablet Take 1 tablet (81 mg total) by mouth daily. Swallow whole. 10/25/20  Yes Kathie Dike, MD  atorvastatin (LIPITOR) 80 MG tablet Take 1 tablet by mouth daily. 09/25/18  Yes [provider]  Calcium Carb-Cholecalciferol 600-800 MG-UNIT TABS Take 1 tablet by mouth at bedtime.   Yes [provider]  carvedilol (COREG) 12.5 MG tablet Take 1 tablet (12.5 mg total) by mouth 2 (two) times daily with a meal. 10/25/20  Yes Memon, Jolaine Artist, MD  donepezil (ARICEPT) 5 MG tablet Take 5 mg by mouth daily. 10/01/20  Yes [provider]  escitalopram (LEXAPRO) 10 MG tablet Take 10 mg by mouth daily.   Yes [provider]  ferrous sulfate 325 (65 FE) MG EC tablet Take 1 tablet (325 mg total) by mouth 2 (two) times daily. 10/25/20 10/25/21 Yes Kathie Dike, MD  furosemide (LASIX) 40 MG tablet Take 1  tablet (40 mg total) by mouth 2 (two) times daily. 11/28/20 12/28/20 Yes Shah, Pratik D, DO  isosorbide mononitrate (IMDUR) 30 MG 24 hr tablet Take 0.5 tablets (15 mg total) by mouth daily. 11/29/20 12/29/20 Yes Shah, Pratik D, DO  Multiple Vitamins-Minerals (MULTIVITAMIN WITH MINERALS) tablet Take 1 tablet by mouth daily.   Yes [provider]  pantoprazole (PROTONIX) 40 MG tablet Take 1 tablet (40 mg total) by mouth daily. 10/25/20  Yes Kathie Dike, MD  polyethylene glycol (MIRALAX / GLYCOLAX) 17 g packet Take 17 g by mouth daily as needed for mild constipation. 10/25/20  Yes Kathie Dike, MD  sodium bicarbonate 650 MG tablet Take 650 mg by mouth 2 (two) times daily. 10/01/20  Yes [provider]  triamcinolone ointment (KENALOG) 0.1 % Apply 1 application topically 2 (two) times daily.   Yes [provider]  vitamin B-12 (CYANOCOBALAMIN) 500 MCG tablet Take 1 tablet (500 mcg total) by mouth daily. 10/25/20  Yes Kathie Dike,  MD  FEROSUL 325 (65 Fe) MG tablet Take by mouth. Patient not taking: No sig reported 11/28/20   [provider]  hydrALAZINE (APRESOLINE) 25 MG tablet Take 1 tablet (25 mg total) by mouth every 8 (eight) hours. Patient not taking: Reported on 12/17/2020 10/25/20   Kathie Dike, MD    Inpatient Medications: Scheduled Meds:  aspirin EC  81 mg Oral Daily   atorvastatin  80 mg Oral Daily   carvedilol  12.5 mg Oral BID WC   enoxaparin (LOVENOX) injection  1 mg/kg Subcutaneous Q24H   feeding supplement (GLUCERNA SHAKE)  237 mL Oral TID BM   ferrous sulfate  325 mg Oral BID PC   furosemide  40 mg Intravenous Q12H   pantoprazole  40 mg Oral Daily   vitamin B-12  500 mcg Oral Daily   Continuous Infusions:  nitroGLYCERIN Stopped (12/16/20 0555)   PRN Meds:   Allergies:    Allergies  Allergen Reactions   Macrobid [Nitrofurantoin Monohyd Macro] Other (See Comments)    Pt states "it paralyzed me"   Sulfamethoxazole-Trimethoprim  Other (See Comments)    "paralyzed me"   Doxycycline Nausea And Vomiting   Clindamycin/Lincomycin Rash    Social History:   Social History   Socioeconomic History   Marital status: Married    Spouse name: Not on file   Number of children: Not on file   Years of education: Not on file   Highest education level: Not on file  Occupational History   Not on file  Tobacco Use   Smoking status: Never   Smokeless tobacco: Never  Vaping Use   Vaping Use: Never used  Substance and Sexual Activity   Alcohol use: No   Drug use: No   Sexual activity: Not Currently  Other Topics Concern   Not on file  Social History Narrative   Not on file   Social Determinants of Health   Financial Resource Strain: Not on file  Food Insecurity: Not on file  Transportation Needs: Not on file  Physical Activity: Not on file  Stress: Not on file  Social Connections: Not on file  Intimate Partner Violence: Not on file    Family History:    Family History  Problem Relation Age of Onset   Cancer Mother    Heart disease Father    Heart attack Father      ROS:  Please see the history of present illness.  All other ROS reviewed and negative.     Physical Exam/Data:   Vitals:   12/16/20 0830 12/16/20 0845 12/16/20 0900 12/16/20 0915  BP: (!) 153/68 (!) 144/57 (!) 147/59 (!) 153/65  Pulse: 61 61 61 62  Resp: 15 16 15 15   Temp:      TempSrc:      SpO2: 100% 100% 100% 100%  Weight:      Height:        Intake/Output Summary (Last 24 hours) at 12/16/2020 0955 Last data filed at 12/24/2020 2309 Gross per 24 hour  Intake --  Output 650 ml  Net -650 ml   Last 3 Weights 12/20/2020 11/28/2020 11/27/2020  Weight (lbs) 174 lb 2.6 oz 173 lb 4.5 oz 175 lb 4.3 oz  Weight (kg) 79 kg 78.6 kg 79.5 kg     Body mass index is 31.85 kg/m.  General: Well developed, well nourished elderly WF, in no acute distress on BiPAP Head: Normocephalic, atraumatic, sclera non-icteric, no xanthomas, nares are  without discharge. Neck: Negative for  carotid bruits. JVP not elevated. Lungs: Decreased BS at bases on BiPAP. No overt wheezing, rhonchi or rales. Breathing is unlabored. Heart: RRR S1 S2 without murmurs, rubs, or gallops.  Abdomen: Soft, non-tender, non-distended with normoactive bowel sounds. No rebound/guarding. Extremities: No clubbing or cyanosis. No edema. Distal pedal pulses are 2+ and equal bilaterally. Neuro: Alert and oriented to self only, thinks she is at [facility], does not know year or date. Moves all extremities spontaneously. Psych:  Calm   EKG:  The EKG was personally reviewed and demonstrates:  12/31/2020 NSR 72bpm, first degree AVB, LVH, left axis deviation, possible anterior Q waves, subtle ST depression I, avL, V5--V6, possible prolonged QT  12/16/20 SB 58bpm similar appearance to prior tracing except with biphasic appearing STTW changes in V2-V4 as well   Changes are accentuated from prior EKG  Telemetry:  Telemetry was personally reviewed and demonstrates:  NSR, brief runs NSVT/AVIR  Relevant CV Studies: 2D echo 10/2020  1. Compared to echo report from 2017, LVEF is down and wall motion  changes are new.   2. There is hypokinesis of the lateral wall, distal anterior and akinesis  of the distal inferior, distal inferolateral, distal inferoseptal and  apical walls . Left ventricular ejection fraction, by estimation, is 45%.  The left ventricular internal  cavity size was mildly dilated. There is mild left ventricular  hypertrophy. Left ventricular diastolic parameters are consistent with  Grade II diastolic dysfunction (pseudonormalization).   3. Right ventricular systolic function is low normal. The right  ventricular size is normal. There is moderately elevated pulmonary artery  systolic pressure.   4. Left atrial size was moderately dilated.   5. Mild mitral valve regurgitation.   6. The aortic valve is tricuspid. Aortic valve regurgitation is not  visualized.  Mild to moderate aortic valve sclerosis/calcification is  present, without any evidence of aortic stenosis.   7. The inferior vena cava is dilated in size with >50% respiratory  variability, suggesting right atrial pressure of 8 mmHg.  Cath 2016  FINDINGS:  Coronary Angiography  1. Left Main - Normal  2. Left anterior descending artery - heavily calcified 95% proximal, 25% distal  3. Diagonals - 25% ostial  4. Left Circumflex - 25% proximal  5. Obtuse Marginals - 25%  6. Right Coronary Artery - 25% proximal, 50% mid  7. Posterior Descending Artery - Normal  Additional comments on angiography: Right Dominance    Percutaneous coronary intervention: An EBU 3.5 guide catheter gave excellent support. The 95% proximal LAD lesion was heavily calcified and easily crossed with a BMW wire. We predilated with a 2.0 x 15 mm trek balloon. We implanted a 2.25 x 20 mm Synergy   drug-eluting stent. This was chosen because of vessel tortuosity heavy calcification. Subsequent angiography demonstrated an excellent result with lesion reduction from 95% to 0% with no complicating features. This was performed using aspirin, Effient,  and heparin.   CONCLUSIONS:  1. Successful transradial cardiac catheterization  2. Obstructive one-vessel coronary artery disease  3. Status post drug-eluting stent implantation to the LAD with lesion reduction from 95% to 0%  4 left ventricular diastolic pressure 20   Laboratory Data:  High Sensitivity Troponin:   Recent Labs  Lab 12/11/2020 1930 01/03/2021 2122 12/26/2020 2357 12/16/20 0225 12/16/20 0354  TROPONINIHS 1,172* 2,277* 3,942* 5,949* 6,904*     Chemistry Recent Labs  Lab 12/25/2020 1739 12/16/20 0354  NA 139 141  K 3.6 3.6  CL 108 111  CO2 23  23  GLUCOSE 157* 118*  BUN 45* 45*  CREATININE 2.52* 2.63*  CALCIUM 8.1* 8.1*  MG  --  1.8  GFRNONAA 19* 18*  ANIONGAP 8 7    Recent Labs  Lab 12/14/2020 1739 12/16/20 0354  PROT 6.3* 5.4*  ALBUMIN 3.1*  2.7*  AST 23 44*  ALT 18 17  ALKPHOS 73 62  BILITOT 0.6 0.4   Lipids No results for input(s): CHOL, TRIG, HDL, LABVLDL, LDLCALC, CHOLHDL in the last 168 hours.  Hematology Recent Labs  Lab 12/14/2020 1739 12/16/20 0354  WBC 11.9* 9.4  RBC 3.19* 2.88*  HGB 9.4* 8.5*  HCT 29.8* 27.5*  MCV 93.4 95.5  MCH 29.5 29.5  MCHC 31.5 30.9  RDW 15.2 15.2  PLT 222 218   Thyroid No results for input(s): TSH, FREET4 in the last 168 hours.  BNP Recent Labs  Lab 12/24/2020 1739  BNP 719.0*    DDimer No results for input(s): DDIMER in the last 168 hours.   Radiology/Studies:  DG Chest Port 1 View  Result Date: 01/02/2021 CLINICAL DATA:  Burning chest pain, intermittent vomiting today EXAM: PORTABLE CHEST 1 VIEW COMPARISON:  11/25/2020 FINDINGS: Single frontal view of the chest demonstrates a stable cardiac silhouette. There is chronic central vascular congestion. Patchy bilateral areas of consolidation are most consistent with mild edema or multifocal pneumonia. No effusion or pneumothorax. No acute bony abnormalities. IMPRESSION: 1. Findings most consistent with mild congestive heart failure. Superimposed infection would be difficult to exclude. Electronically Signed   By: Randa Ngo M.D.   On: 12/06/2020 17:26     Assessment and Plan:   1. NSTEMI with known history of CAD s/p PCI 2016 - troponins are suggestive of acute coronary syndrome which correlates with patient's symptoms - typically cath would be indicated but she has previously been felt to be a poor candidate for invasive workup due to advanced kidney disease and anemia; also confirmed by daughter that she has dementia as well - currently ordered for ASA, carvedilol, statin, IV NTG, Lovenox per pharmacy - orals on hold as she is on BiPAP but would try to get her back on schedule for these when she is taking PO - LDL 43 so would continue statin - will discuss further with MD  2. Acute hypoxic respiratory failure likely from  acute on chronic combined CHF - recent echo findings as above with EF decline to 45% with WMA suggestive of ischemic cardiomyopathy - IM has ordered repeat echocardiogram - agree with IV Lasix as ordered - continue beta blocker - consider restart hydralazine when able to take orals - avoid ACEI/ARB/ARNI/spironolactone due to advanced CKD - wean supplemental O2 as able  3. Prolonged QT interval with intermittent NSVT/AIVR - suspect ischemic in nature - last K 3.6, Mg 1.8 but need to be careful with lyte repletion given her advanced kidney disease - will review with MD - continue beta blocker   4. Severe CKD stage IV - baseline Cr appears variable recently, somewhere in the 2.5-3.0 range - last value 2.63  5. Chronic anemia with recent hemoccult positivity 10/2020 and GI workup as above - most recent value primarily 8-9 range, admitted at 9.4->8.5  5. Alzheimer's dementia - daughter confirms she carries this diagnosis - consider goals of care discussion as we approach difficult decisions regarding invasive management  Risk Assessment/Risk Scores:     TIMI Risk Score for Unstable Angina or Non-ST Elevation MI:   The patient's TIMI risk score is 6, which  indicates a 41% risk of all cause mortality, new or recurrent myocardial infarction or need for urgent revascularization in the next 14 days.  New York Heart Association (NYHA) Functional Class NYHA Class IV on arrival   For questions or updates, please contact [facility] Please consult [email or web address] for contact info under    Signed, Charlie Pitter, PA-C  12/16/2020 9:55 AM  Personally seen and examined. Agree with above.  75 year old female with advancing Alzheimer's dementia who was admitted yesterday after episode of chest burning tightness, shortness of breath.  In the emergency room, her troponin values were elevated and trended upward to 6900.  She was placed on BiPAP.  She this morning, feels well.  She is not having  any chest pain, no shortness of breath.  GEN: Well nourished, well developed, in no acute distress laying flat comfortably HEENT: normal wearing BiPAP mask Neck: no JVD, carotid bruits, or masses Cardiac: RRR; no murmurs, rubs, or gallops,no edema  Respiratory:  clear to auscultation bilaterally, normal work of breathing GI: soft, nontender, nondistended, + BS MS: no deformity or atrophy Skin: warm and dry, no rash Neuro:  Alert pleasant strength and sensation are intact Psych:  memory impairment noted  EKG with subtle T wave inversion noted in the precordial leads Overall no dramatic ischemic changes.  Echocardiogram 10/2020 showed EF of 45%  Cardiac catheterization 2016-95% proximal LAD-stented.  Otherwise mild to moderate disease elsewhere.  Creatinine 2.63 hemoglobin 8.5 chronic elevation in both BNP 719.  Chest x-ray personally reviewed shows mild congestive heart failure  Assessment and plan:  Non-ST elevation myocardial infarction - I had a lengthy discussion with her daughter Pasty Spillers regarding the situation.  Clearly this is a complex situation given her underlying medical condition especially her progressive Alzheimer's dementia.  Her daughter states that this does seem to be getting worse however she is staying at home and taking care of her husband who is even in worse shape, more wheelchair-bound.  Her daughter lives approximately 10 minutes away from the hospital. - Overall given her troponin elevation of 6900 high-sensitivity, this represents at least a moderate level of myocardial infarction.  I do not believe that her LAD stent has occluded given its proximal nature and the lack of ST elevations in the anterior leads.  She does have some subtle T wave changes on her ECG. - We discussed the pros and cons of various therapies including continued medical therapy versus early invasive therapy with cardiac catheterization.  After this discussion, we decided to pursue  continued medical management.  I am concerned that given her current memory impairment as well as increased chronic kidney disease stage IV and current lack of symptoms, i.e. no chest pain, no shortness of breath currently, that we could potentially be causing more harm than good with an invasive approach.  This could result in worsening renal function and perhaps acute kidney injury/renal failure.  Also, it would be challenging for her to be cooperative during cardiac catheterization given her lack of understanding.  Her daughter is in agreement.  Also, we will not transfer her to [facility].  We will continue with medical management which includes IV Lovenox for the next 48 hours along with high intensity statin, beta-blocker, aspirin, Plavix.  I spoke with Dr. Manuella Ghazi, primary attending. -Her daughter is aware of potential worsening morbidity mortality especially over the next 30 days.  Her telemetry did show brief runs of intermittent nonsustained ventricular tachycardia earlier as well as a IVR  which may be suggestive of a reperfusion.  Acute hypoxic respiratory failure - EF was 45% previously.  Repeating echocardiogram. -Continue with IV Lasix as ordered. -If blood pressure allows, restart hydralazine when able to take orals.  Avoiding ACE etc. given her chronic kidney disease stage IV.  CRITICAL CARE Performed by: Candee Furbish   Total critical care time: 60 minutes  Critical care time was exclusive of separately billable procedures and treating other patients.  Critical care was necessary to treat or prevent imminent or life-threatening deterioration.  Critical care was time spent personally by me on the following activities: development of treatment plan with patient and/or surrogate as well as nursing, discussions with consultants, evaluation of patient's response to treatment, examination of patient, obtaining history from patient or surrogate, ordering and performing treatments and  interventions, ordering and review of laboratory studies, ordering and review of radiographic studies, pulse oximetry and re-evaluation of patient's condition.  Candee Furbish, MD

## 2020-12-16 NOTE — Progress Notes (Signed)
Patient currently on Bipap.  Patient sat was 100 on 40% FIO2.  Weaned patient down to 30% FIO2 sat now 98 to 99%.  Will continue to monitor.

## 2020-12-16 NOTE — Progress Notes (Signed)
Came in to assess patient on Bipap.  Patient sat on 30% was at 92%.  Raised patient back up to 40% FIO2 and patient sat went back up to 95%.  RN informed.

## 2020-12-16 NOTE — ED Notes (Signed)
Pt. Called out with complaints of trouble breathing. This nurse entered pts. Room pts O2 was 93. Respiratory has been called. Pt. States it feels like they have an elephant sitting on their chest. Pt. Has labored breathing.O2 is 6 L and pt is still at 93.

## 2020-12-16 NOTE — ED Notes (Addendum)
Contacted Dr. Manuella Ghazi. Pt. Has been placed back on BI-PAP.

## 2020-12-16 NOTE — ED Notes (Signed)
Dr. Manuella Ghazi notified.

## 2020-12-16 NOTE — ED Notes (Signed)
Per pt. Nausea is gone. Pt. Has been placed back on BI-PAP.

## 2020-12-16 NOTE — ED Notes (Signed)
Per Dr. Manuella Ghazi place pt. Back on nitro.

## 2020-12-16 NOTE — ED Notes (Signed)
Pt. Stated they felt like they were about to vomit. This nurse removed c-pap and placed a non-rebreather on pt. This nurse contacted Dr. Manuella Ghazi for some zofran. Dr. Manuella Ghazi was notified. Dr. Manuella Ghazi at bedside.

## 2020-12-16 NOTE — Progress Notes (Signed)
*  PRELIMINARY RESULTS* Echocardiogram 2D Echocardiogram has been performed.  Sabrina Wheeler 12/16/2020, 3:15 PM

## 2020-12-16 NOTE — ED Notes (Signed)
Per pt. No longer feeling pain.

## 2020-12-16 NOTE — ED Notes (Signed)
ED TO INPATIENT HANDOFF REPORT  ED Nurse Name and Phone #:   S Name/Age/Gender Ihor Dow 75 y.o. female Room/Bed: APA11/APA11  Code Status   Code Status: DNR  Home/SNF/Other Home Patient oriented to: self, place, and situation Is this baseline? Yes   Triage Complete: Triage complete  Chief Complaint Acute exacerbation of CHF (congestive heart failure) (HCC) [I50.9] NSTEMI (non-ST elevated myocardial infarction) [facility]) [I21.4]  Triage Note Pt to ER via EMS from home with c/o chest pain described as burning.  This is second time she has called EMS today.  Pt has had vomiting intermittently today.  Reports of EMS O2 sats low 80s, placed on 2L [place] and pt is feeling better.     Allergies Allergies  Allergen Reactions   Macrobid [Nitrofurantoin Monohyd Macro] Other (See Comments)    Pt states "it paralyzed me"   Sulfamethoxazole-Trimethoprim Other (See Comments)    "paralyzed me"   Doxycycline Nausea And Vomiting   Clindamycin/Lincomycin Rash    Level of Care/Admitting Diagnosis ED Disposition     ED Disposition  Admit   Condition  --   [place]: [facility] [[id number]]  Level of Care: Stepdown [14]  Covid Evaluation: Asymptomatic Screening Protocol (No Symptoms)  Diagnosis: NSTEMI (non-ST elevated myocardial infarction) [facility]) [244010]  Admitting Physician: Rodena Goldmann [[id number]]  Attending Physician: Rodena Goldmann [[id number]]  Estimated length of stay: past midnight tomorrow  Certification:: I certify this patient will need inpatient services for at least 2 midnights          B Medical/Surgery History Past Medical History:  Diagnosis Date   Alzheimer's dementia ([place])    Anemia    Broken ankle 2008   left    CAD (coronary artery disease)    Chronic combined systolic and diastolic CHF (congestive heart failure) (HCC)    CKD (chronic kidney disease), stage IV ([place])    Diabetes mellitus without complication ([place])    Heme positive  stool    Hiatal hernia    Hypertension    Obesity    Renal disorder    Stage 4 kidney disease   Schatzki's ring    Past Surgical History:  Procedure Laterality Date   BILE DUCT STENT PLACEMENT     BIOPSY  10/23/2020   Procedure: BIOPSY;  Surgeon: Daneil Dolin, MD;  [facility];  Service: Endoscopy;;   COLONOSCOPY WITH PROPOFOL N/A 10/23/2020   Procedure: COLONOSCOPY WITH PROPOFOL;  Surgeon: Daneil Dolin, MD;  [facility];  Service: Endoscopy;  Laterality: N/A;   ESOPHAGOGASTRODUODENOSCOPY (EGD) WITH PROPOFOL N/A 10/23/2020   Procedure: ESOPHAGOGASTRODUODENOSCOPY (EGD) WITH PROPOFOL;  Surgeon: Daneil Dolin, MD;  [facility];  Service: Endoscopy;  Laterality: N/A;   GIVENS CAPSULE STUDY N/A 10/24/2020   Procedure: GIVENS CAPSULE STUDY;  Surgeon: Daneil Dolin, MD;  [facility];  Service: Endoscopy;  Laterality: N/A;   INCISIONAL HERNIA REPAIR     POLYPECTOMY  10/23/2020   Procedure: POLYPECTOMY;  Surgeon: Daneil Dolin, MD;  [facility];  Service: Endoscopy;;     A IV Location/Drains/Wounds Patient Lines/Drains/Airways Status     Active Line/Drains/Airways     Name Placement date Placement time Site Days   Peripheral IV 01/05/2021 18 G Left Antecubital 01/02/2021  1534  Antecubital  1   Peripheral IV 01/03/2021 20 G Posterior;Right Hand 12/23/2020  1834  Hand  1   Pressure Injury 11/25/20 Buttocks Right;Medial;Mid  Stage 2 -  Partial thickness loss of dermis presenting as a shallow open injury with a red, pink wound bed without slough. 11/25/20  0939  -- 21   Pressure Injury 11/25/20 Buttocks Left;Medial;Mid Stage 2 -  Partial thickness loss of dermis presenting as a shallow open injury with a red, pink wound bed without slough. 11/25/20  0941  -- 21   Wound / Incision (Open or Dehisced) 10/15/20 Non-pressure wound Leg Left;Anterior;Lower 10/15/20  2200  Leg  62   Wound / Incision (Open or Dehisced) 10/18/20 Other (Comment)  Heel Right;Medial;Mid Small Calus 10/18/20  0038  Heel  59            Intake/Output Last 24 hours  Intake/Output Summary (Last 24 hours) at 12/16/2020 1702 Last data filed at 12/16/2020 1500 Gross per 24 hour  Intake --  Output 1600 ml  Net -1600 ml    Labs/Imaging Results for orders placed or performed during the hospital encounter of 12/11/2020 (from the past 48 hour(s))  CBC with Differential/Platelet     Status: Abnormal   Collection Time: 12/10/2020  5:39 PM  Result Value Ref Range   WBC 11.9 (H) 4.0 - 10.5 K/uL   RBC 3.19 (L) 3.87 - 5.11 MIL/uL   Hemoglobin 9.4 (L) 12.0 - 15.0 g/dL   HCT 29.8 (L) 36.0 - 46.0 %   MCV 93.4 80.0 - 100.0 fL   MCH 29.5 26.0 - 34.0 pg   MCHC 31.5 30.0 - 36.0 g/dL   RDW 15.2 11.5 - 15.5 %   Platelets 222 150 - 400 K/uL   nRBC 0.0 0.0 - 0.2 %   Neutrophils Relative % 87 %   Neutro Abs 10.5 (H) 1.7 - 7.7 K/uL   Lymphocytes Relative 7 %   Lymphs Abs 0.9 0.7 - 4.0 K/uL   Monocytes Relative 4 %   Monocytes Absolute 0.5 0.1 - 1.0 K/uL   Eosinophils Relative 0 %   Eosinophils Absolute 0.0 0.0 - 0.5 K/uL   Basophils Relative 1 %   Basophils Absolute 0.1 0.0 - 0.1 K/uL   Immature Granulocytes 1 %   Abs Immature Granulocytes 0.06 0.00 - 0.07 K/uL    Comment: Performed at [facility], [address], [place], [place] [address]  Comprehensive metabolic panel     Status: Abnormal   Collection Time: 12/17/2020  5:39 PM  Result Value Ref Range   Sodium 139 135 - 145 mmol/L   Potassium 3.6 3.5 - 5.1 mmol/L   Chloride 108 98 - 111 mmol/L   CO2 23 22 - 32 mmol/L   Glucose, Bld 157 (H) 70 - 99 mg/dL    Comment: Glucose reference range applies only to samples taken after fasting for at least 8 hours.   BUN 45 (H) 8 - 23 mg/dL   Creatinine, Ser 2.52 (H) 0.44 - 1.00 mg/dL   Calcium 8.1 (L) 8.9 - 10.3 mg/dL   Total Protein 6.3 (L) 6.5 - 8.1 g/dL   Albumin 3.1 (L) 3.5 - 5.0 g/dL   AST 23 15 - 41 U/L   ALT 18 0 - 44 U/L   Alkaline Phosphatase 73 38 -  126 U/L   Total Bilirubin 0.6 0.3 - 1.2 mg/dL   GFR, Estimated 19 (L) >60 mL/min    Comment: (NOTE) Calculated using the CKD-EPI Creatinine Equation (2021)    Anion gap 8 5 - 15    Comment: Performed at [facility], [address] [place], [place] [address]  Brain natriuretic peptide     Status: Abnormal   Collection Time: 12/31/2020  5:39 PM  Result Value Ref Range   B Natriuretic Peptide 719.0 (H) 0.0 - 100.0 pg/mL    Comment: Performed at [facility], [address], [place], [place] [address]  Troponin I (High Sensitivity)     Status: Abnormal   Collection Time: 12/06/2020  5:39 PM  Result Value Ref Range   Troponin I (High Sensitivity) 558 (HH) <18 ng/L    Comment: CRITICAL RESULT CALLED TO, READ BACK BY AND VERIFIED WITH: MYRICK,B ON 12/28/2020 AT 1835 BY LOY.C (NOTE) Elevated high sensitivity troponin I (hsTnI) values and significant  changes across serial measurements may suggest ACS but many other  chronic and acute conditions are known to elevate hsTnI results.  Refer to the Links section for chest pain algorithms and additional  guidance. Performed at [facility], [address], [place], [place] [address]   Resp Panel by RT-PCR (Flu A&B, Covid) Nasopharyngeal Swab     Status: None   Collection Time: 12/24/2020  6:25 PM   Specimen: Nasopharyngeal Swab; Nasopharyngeal(NP) swabs in vial transport medium  Result Value Ref Range   SARS Coronavirus 2 by RT PCR NEGATIVE NEGATIVE    Comment: (NOTE) SARS-CoV-2 target nucleic acids are NOT DETECTED.  The SARS-CoV-2 RNA is generally detectable in upper respiratory specimens during the acute phase of infection. The lowest concentration of SARS-CoV-2 viral copies this assay can detect is 138 copies/mL. A negative result does not preclude SARS-Cov-2 infection and should not be used as the sole basis for treatment or other patient management decisions. A negative result may occur with  improper specimen collection/handling, submission  of specimen other than nasopharyngeal swab, presence of viral mutation(s) within the areas targeted by this assay, and inadequate number of viral copies(<138 copies/mL). A negative result must be combined with clinical observations, patient history, and epidemiological information. The expected result is Negative.  Fact Sheet for Patients:  EntrepreneurPulse.com.au  Fact Sheet for Healthcare Providers:  IncredibleEmployment.be  This test is no t yet approved or cleared by the [place] FDA and  has been authorized for detection and/or diagnosis of SARS-CoV-2 by FDA under an Emergency Use Authorization (EUA). This EUA will remain  in effect (meaning this test can be used) for the duration of the COVID-19 declaration under Section 564(b)(1) of the Act, 21 U.S.C.section 360bbb-3(b)(1), unless the authorization is terminated  or revoked sooner.       Influenza A by PCR NEGATIVE NEGATIVE   Influenza B by PCR NEGATIVE NEGATIVE    Comment: (NOTE) The Xpert Xpress SARS-CoV-2/FLU/RSV plus assay is intended as an aid in the diagnosis of influenza from Nasopharyngeal swab specimens and should not be used as a sole basis for treatment. Nasal washings and aspirates are unacceptable for Xpert Xpress SARS-CoV-2/FLU/RSV testing.  Fact Sheet for Patients: EntrepreneurPulse.com.au  Fact Sheet for Healthcare Providers: IncredibleEmployment.be  This test is not yet approved or cleared by the [place] FDA and has been authorized for detection and/or diagnosis of SARS-CoV-2 by FDA under an Emergency Use Authorization (EUA). This EUA will remain in effect (meaning this test can be used) for the duration of the COVID-19 declaration under Section 564(b)(1) of the Act, 21 U.S.C. section 360bbb-3(b)(1), unless the authorization is terminated or revoked.  Performed at [facility], [address] [place], [place]  [address]   Troponin I (High Sensitivity)     Status: Abnormal   Collection Time: 12/26/2020  7:30  PM  Result Value Ref Range   Troponin I (High Sensitivity) 1,172 (HH) <18 ng/L    Comment: DELTA CHECK NOTED CRITICAL RESULT CALLED TO, READ BACK BY AND VERIFIED WITH: BRAME,M ON 01/04/2021 AT 2040 BY LOY,C (NOTE) Elevated high sensitivity troponin I (hsTnI) values and significant  changes across serial measurements may suggest ACS but many other  chronic and acute conditions are known to elevate hsTnI results.  Refer to the Links section for chest pain algorithms and additional  guidance. Performed at [facility], [address] [place], [place] [address]   Blood gas, arterial     Status: Abnormal   Collection Time: 12/26/2020  9:03 PM  Result Value Ref Range   FIO2 40.00    pH, Arterial 7.337 (L) 7.350 - 7.450   pCO2 arterial 39.9 32.0 - 48.0 mmHg   pO2, Arterial 85.1 83.0 - 108.0 mmHg   Bicarbonate 21.0 20.0 - 28.0 mmol/L   Acid-base deficit 4.0 (H) 0.0 - 2.0 mmol/L   O2 Saturation 94.6 %   Patient temperature 37.0    Allens test (pass/fail) BRACHIAL ARTERY (A) PASS    Comment: Performed at [facility], [address], [place], [place] [address]  Troponin I (High Sensitivity)     Status: Abnormal   Collection Time: 12/30/2020  9:22 PM  Result Value Ref Range   Troponin I (High Sensitivity) 2,277 (HH) <18 ng/L    Comment: DELTA CHECK NOTED CRITICAL RESULT CALLED TO, READ BACK BY AND VERIFIED WITH: BLACK,C ON 12/19/2020 AT 2215 BY LOY,C (NOTE) Elevated high sensitivity troponin I (hsTnI) values and significant  changes across serial measurements may suggest ACS but many other  chronic and acute conditions are known to elevate hsTnI results.  Refer to the Links section for chest pain algorithms and additional  guidance. Performed at [facility], [address], [place], [place] [address]   Troponin I (High Sensitivity)     Status: Abnormal   Collection Time: 12/23/2020 11:57 PM  Result  Value Ref Range   Troponin I (High Sensitivity) 3,942 (HH) <18 ng/L    Comment: DELTA CHECK NOTED CRITICAL RESULT CALLED TO, READ BACK BY AND VERIFIED WITH: BLACK,C@0113  BY MATTHEWS, B 10.11.22 (NOTE) Elevated high sensitivity troponin I (hsTnI) values and significant  changes across serial measurements may suggest ACS but many other  chronic and acute conditions are known to elevate hsTnI results.  Refer to the Links section for chest pain algorithms and additional  guidance. Performed at [facility], [address] [place], [place] [address]   Troponin I (High Sensitivity)     Status: Abnormal   Collection Time: 12/16/20  2:25 AM  Result Value Ref Range   Troponin I (High Sensitivity) 5,949 (HH) <18 ng/L    Comment: DELTA CHECK NOTED CRITICAL RESULT CALLED TO, READ BACK BY AND VERIFIED WITH: BELTON,K@0321  by MATTHEWS,B 10.11.22 (NOTE) Elevated high sensitivity troponin I (hsTnI) values and significant  changes across serial measurements may suggest ACS but many other  chronic and acute conditions are known to elevate hsTnI results.  Refer to the Links section for chest pain algorithms and additional  guidance. Performed at [facility], [address], [place], [place] [address]   Comprehensive metabolic panel     Status: Abnormal   Collection Time: 12/16/20  3:54 AM  Result Value Ref Range   Sodium 141 135 - 145 mmol/L   Potassium 3.6 3.5 - 5.1 mmol/L   Chloride 111 98 - 111 mmol/L   CO2  23 22 - 32 mmol/L   Glucose, Bld 118 (H) 70 - 99 mg/dL    Comment: Glucose reference range applies only to samples taken after fasting for at least 8 hours.   BUN 45 (H) 8 - 23 mg/dL   Creatinine, Ser 2.63 (H) 0.44 - 1.00 mg/dL   Calcium 8.1 (L) 8.9 - 10.3 mg/dL   Total Protein 5.4 (L) 6.5 - 8.1 g/dL   Albumin 2.7 (L) 3.5 - 5.0 g/dL   AST 44 (H) 15 - 41 U/L   ALT 17 0 - 44 U/L   Alkaline Phosphatase 62 38 - 126 U/L   Total Bilirubin 0.4 0.3 - 1.2 mg/dL   GFR, Estimated 18 (L) >60  mL/min    Comment: (NOTE) Calculated using the CKD-EPI Creatinine Equation (2021)    Anion gap 7 5 - 15    Comment: Performed at [facility], [address], [place], [place] [address]  CBC     Status: Abnormal   Collection Time: 12/16/20  3:54 AM  Result Value Ref Range   WBC 9.4 4.0 - 10.5 K/uL   RBC 2.88 (L) 3.87 - 5.11 MIL/uL   Hemoglobin 8.5 (L) 12.0 - 15.0 g/dL   HCT 27.5 (L) 36.0 - 46.0 %   MCV 95.5 80.0 - 100.0 fL   MCH 29.5 26.0 - 34.0 pg   MCHC 30.9 30.0 - 36.0 g/dL   RDW 15.2 11.5 - 15.5 %   Platelets 218 150 - 400 K/uL   nRBC 0.0 0.0 - 0.2 %    Comment: Performed at [facility], [address], [place], [place] [address]  Protime-INR     Status: Abnormal   Collection Time: 12/16/20  3:54 AM  Result Value Ref Range   Prothrombin Time 16.1 (H) 11.4 - 15.2 seconds   INR 1.3 (H) 0.8 - 1.2    Comment: (NOTE) INR goal varies based on device and disease states. Performed at [facility], [address] [place], [place] [address]   APTT     Status: None   Collection Time: 12/16/20  3:54 AM  Result Value Ref Range   aPTT 35 24 - 36 seconds    Comment: Performed at [facility], [address] [place], [place] [address]  Magnesium     Status: None   Collection Time: 12/16/20  3:54 AM  Result Value Ref Range   Magnesium 1.8 1.7 - 2.4 mg/dL    Comment: Performed at [facility], [address], [place], [place] [address]  Phosphorus     Status: Abnormal   Collection Time: 12/16/20  3:54 AM  Result Value Ref Range   Phosphorus 5.5 (H) 2.5 - 4.6 mg/dL    Comment: Performed at [facility], [address] [place],  [address]  Procalcitonin - Baseline     Status: None   Collection Time: 12/16/20  3:54 AM  Result Value Ref Range   Procalcitonin <0.10 ng/mL    Comment:        Interpretation: PCT (Procalcitonin) <= 0.5 ng/mL: Systemic infection (sepsis) is not likely. Local bacterial infection is possible. (NOTE)       Sepsis PCT Algorithm           Lower  Respiratory Tract                                      Infection PCT Algorithm    ----------------------------     ----------------------------  PCT < 0.25 ng/mL                PCT < 0.10 ng/mL          Strongly encourage             Strongly discourage   discontinuation of antibiotics    initiation of antibiotics    ----------------------------     -----------------------------       PCT 0.25 - 0.50 ng/mL            PCT 0.10 - 0.25 ng/mL               OR       >80% decrease in PCT            Discourage initiation of                                            antibiotics      Encourage discontinuation           of antibiotics    ----------------------------     -----------------------------         PCT >= 0.50 ng/mL              PCT 0.26 - 0.50 ng/mL               AND        <80% decrease in PCT             Encourage initiation of                                             antibiotics       Encourage continuation           of antibiotics    ----------------------------     -----------------------------        PCT >= 0.50 ng/mL                  PCT > 0.50 ng/mL               AND         increase in PCT                  Strongly encourage                                      initiation of antibiotics    Strongly encourage escalation           of antibiotics                                     -----------------------------                                           PCT <= 0.25 ng/mL  OR                                        > 80% decrease in PCT                                      Discontinue / Do not initiate                                             antibiotics  Performed at [facility], [address], [place], [place] [address]   Troponin I (High Sensitivity)     Status: Abnormal   Collection Time: 12/16/20  3:54 AM  Result Value Ref Range   Troponin I (High Sensitivity) 6,904 (HH) <18 ng/L    Comment: CRITICAL  RESULT CALLED TO, READ BACK BY AND VERIFIED WITH: BLACK,C AT 5:20AM ON 12/16/20 BY FESTERMAN,C (NOTE) Elevated high sensitivity troponin I (hsTnI) values and significant  changes across serial measurements may suggest ACS but many other  chronic and acute conditions are known to elevate hsTnI results.  Refer to the Links section for chest pain algorithms and additional  guidance. Performed at [facility], [address], [place], [place] [address]    DG Chest Port 1 View  Result Date: 12/28/2020 CLINICAL DATA:  Burning chest pain, intermittent vomiting today EXAM: PORTABLE CHEST 1 VIEW COMPARISON:  11/25/2020 FINDINGS: Single frontal view of the chest demonstrates a stable cardiac silhouette. There is chronic central vascular congestion. Patchy bilateral areas of consolidation are most consistent with mild edema or multifocal pneumonia. No effusion or pneumothorax. No acute bony abnormalities. IMPRESSION: 1. Findings most consistent with mild congestive heart failure. Superimposed infection would be difficult to exclude. Electronically Signed   By: Randa Ngo M.D.   On: 12/10/2020 17:26    Pending Labs Unresulted Labs (From admission, onward)     Start     Ordered   12/17/20 0355  Basic metabolic panel  Tomorrow morning,   R        12/16/20 1418   12/17/20 0500  Magnesium  Tomorrow morning,   R        12/16/20 1418   12/17/20 0500  CBC  Tomorrow morning,   R        12/16/20 1418   12/16/20 1605  MRSA Next Gen by PCR, Nasal  Once,   R        12/16/20 1604            Vitals/Pain Today's Vitals   12/16/20 1445 12/16/20 1500 12/16/20 1515 12/16/20 1530  BP: (!) 162/73 (!) 155/66 (!) 152/128 (!) 162/70  Pulse: 89 79 76 77  Resp: (!) 31 (!) 25 (!) 22 (!) 21  Temp:      TempSrc:      SpO2: 96% 94% 95% 96%  Weight:      Height:      PainSc:        Isolation Precautions No active isolations  Medications Medications  nitroGLYCERIN 50 mg in dextrose 5 % 250 mL (0.2  mg/mL) infusion (15 mcg/min Intravenous Rate/Dose Change 12/16/20 1450)  furosemide (LASIX) injection 40 mg (40 mg Intravenous Given 12/16/20 0747)  enoxaparin (  LOVENOX) injection 80 mg (80 mg Subcutaneous Given 12/11/2020 2149)  aspirin EC tablet 81 mg (0 mg Oral Hold 12/16/20 1000)  feeding supplement (GLUCERNA SHAKE) (GLUCERNA SHAKE) liquid 237 mL (0 mLs Oral Hold 12/16/20 1402)  atorvastatin (LIPITOR) tablet 80 mg (0 mg Oral Hold 12/16/20 1000)  carvedilol (COREG) tablet 12.5 mg (12.5 mg Oral Not Given 12/16/20 0740)  pantoprazole (PROTONIX) EC tablet 40 mg (0 mg Oral Hold 12/16/20 1000)  ferrous sulfate tablet 325 mg (325 mg Oral Not Given 12/16/20 0741)  vitamin B-12 (CYANOCOBALAMIN) tablet 500 mcg (0 mcg Oral Hold 12/16/20 1000)  ondansetron (ZOFRAN) injection 4 mg (4 mg Intravenous Given 12/16/20 1426)  ondansetron (ZOFRAN) 4 MG/2ML injection (has no administration in time range)  Chlorhexidine Gluconate Cloth 2 % PADS 6 each (has no administration in time range)  ondansetron (ZOFRAN) injection 4 mg (4 mg Intravenous Given 12/23/2020 1734)  furosemide (LASIX) injection 40 mg (40 mg Intravenous Given 12/27/2020 1821)  furosemide (LASIX) injection 40 mg (40 mg Intravenous Given 12/12/2020 1835)  aspirin suppository 300 mg (300 mg Rectal Given 01/04/2021 2222)    Mobility walks with device High fall risk   Focused Assessments     R Recommendations: See Admitting Provider Note  Report given to:   Additional Notes:

## 2020-12-16 NOTE — ED Notes (Signed)
Pt is on BiPAP, tolerating well.  Denies any pain at this time.  SB 55.

## 2020-12-17 ENCOUNTER — Inpatient Hospital Stay (HOSPITAL_COMMUNITY): Payer: Medicare Other

## 2020-12-17 DIAGNOSIS — Z515 Encounter for palliative care: Secondary | ICD-10-CM | POA: Diagnosis not present

## 2020-12-17 DIAGNOSIS — N184 Chronic kidney disease, stage 4 (severe): Secondary | ICD-10-CM | POA: Diagnosis not present

## 2020-12-17 DIAGNOSIS — Z7189 Other specified counseling: Secondary | ICD-10-CM

## 2020-12-17 DIAGNOSIS — I214 Non-ST elevation (NSTEMI) myocardial infarction: Secondary | ICD-10-CM | POA: Diagnosis not present

## 2020-12-17 DIAGNOSIS — J9601 Acute respiratory failure with hypoxia: Secondary | ICD-10-CM | POA: Diagnosis not present

## 2020-12-17 DIAGNOSIS — R778 Other specified abnormalities of plasma proteins: Secondary | ICD-10-CM | POA: Diagnosis not present

## 2020-12-17 DIAGNOSIS — I5043 Acute on chronic combined systolic (congestive) and diastolic (congestive) heart failure: Secondary | ICD-10-CM | POA: Diagnosis not present

## 2020-12-17 LAB — BASIC METABOLIC PANEL
Anion gap: 10 (ref 5–15)
BUN: 49 mg/dL — ABNORMAL HIGH (ref 8–23)
CO2: 23 mmol/L (ref 22–32)
Calcium: 8.3 mg/dL — ABNORMAL LOW (ref 8.9–10.3)
Chloride: 109 mmol/L (ref 98–111)
Creatinine, Ser: 2.7 mg/dL — ABNORMAL HIGH (ref 0.44–1.00)
GFR, Estimated: 18 mL/min — ABNORMAL LOW (ref >60)
Glucose, Bld: 145 mg/dL — ABNORMAL HIGH (ref 70–99)
Potassium: 3.6 mmol/L (ref 3.5–5.1)
Sodium: 142 mmol/L (ref 135–145)

## 2020-12-17 LAB — CBC
HCT: 31.6 % — ABNORMAL LOW (ref 36.0–46.0)
Hemoglobin: 10 g/dL — ABNORMAL LOW (ref 12.0–15.0)
MCH: 30 pg (ref 26.0–34.0)
MCHC: 31.6 g/dL (ref 30.0–36.0)
MCV: 94.9 fL (ref 80.0–100.0)
Platelets: 237 10*3/uL (ref 150–400)
RBC: 3.33 MIL/uL — ABNORMAL LOW (ref 3.87–5.11)
RDW: 15.2 % (ref 11.5–15.5)
WBC: 16.1 10*3/uL — ABNORMAL HIGH (ref 4.0–10.5)
nRBC: 0 % (ref 0.0–0.2)

## 2020-12-17 LAB — MRSA NEXT GEN BY PCR, NASAL: MRSA by PCR Next Gen: NOT DETECTED

## 2020-12-17 LAB — MAGNESIUM: Magnesium: 1.8 mg/dL (ref 1.7–2.4)

## 2020-12-17 LAB — TROPONIN I (HIGH SENSITIVITY): Troponin I (High Sensitivity): 4978 ng/L (ref <18)

## 2020-12-17 MED ORDER — HEPARIN (PORCINE) 25000 UT/250ML-% IV SOLN: 900.0000 [IU]/h | INTRAVENOUS | Status: DC

## 2020-12-17 MED ORDER — MORPHINE BOLUS VIA INFUSION
2.0000 mg | INTRAVENOUS | Status: DC | PRN
  Administered 2020-12-17 (×2): 2 mg via INTRAVENOUS
  Administered 2020-12-17: 4 mg via INTRAVENOUS

## 2020-12-17 MED ORDER — HALOPERIDOL LACTATE 5 MG/ML IJ SOLN: 2.0000 mg | Freq: Four times a day (QID) | INTRAMUSCULAR | Status: DC | PRN

## 2020-12-17 MED ORDER — HYDROMORPHONE BOLUS VIA INFUSION: 0.5000 mg | INTRAVENOUS | Status: DC | PRN

## 2020-12-17 MED ORDER — GLYCOPYRROLATE 0.2 MG/ML IJ SOLN
0.2000 mg | Freq: Three times a day (TID) | INTRAMUSCULAR | Status: DC
  Administered 2020-12-17 (×2): 0.2 mg via INTRAVENOUS
  Filled 2020-12-17 (×2): qty 1

## 2020-12-17 MED ORDER — SODIUM CHLORIDE 0.9 % IV SOLN: 0.5000 mg/h | INTRAVENOUS | Status: DC

## 2020-12-17 MED ORDER — FUROSEMIDE 10 MG/ML IJ SOLN
80.0000 mg | Freq: Two times a day (BID) | INTRAMUSCULAR | Status: DC
  Administered 2020-12-17: 80 mg via INTRAVENOUS
  Filled 2020-12-17: qty 8

## 2020-12-17 MED ORDER — HYDROMORPHONE HCL 1 MG/ML IJ SOLN: 0.5000 mg | INTRAMUSCULAR | Status: DC | PRN

## 2020-12-17 MED ORDER — GLYCOPYRROLATE 0.2 MG/ML IJ SOLN: 0.2000 mg | INTRAMUSCULAR | Status: DC | PRN

## 2020-12-17 MED ORDER — ISOSORBIDE MONONITRATE ER 60 MG PO TB24: 60.0000 mg | ORAL_TABLET | Freq: Every day | ORAL | Status: DC

## 2020-12-17 MED ORDER — FUROSEMIDE 10 MG/ML IJ SOLN
40.0000 mg | Freq: Once | INTRAMUSCULAR | Status: AC
  Administered 2020-12-17: 40 mg via INTRAVENOUS
  Filled 2020-12-17: qty 4

## 2020-12-17 MED ORDER — MORPHINE 100MG IN NS 100ML (1MG/ML) PREMIX INFUSION
2.0000 mg/h | INTRAVENOUS | Status: DC
  Administered 2020-12-17: 2 mg/h via INTRAVENOUS
  Filled 2020-12-17: qty 100

## 2020-12-17 MED ORDER — LORAZEPAM 2 MG/ML IJ SOLN: 1.0000 mg | INTRAMUSCULAR | Status: DC | PRN

## 2020-12-17 MED ORDER — HYDRALAZINE HCL 25 MG PO TABS: 25.0000 mg | ORAL_TABLET | Freq: Three times a day (TID) | ORAL | Status: DC

## 2020-12-17 MED ORDER — METOPROLOL TARTRATE 5 MG/5ML IV SOLN: 5.0000 mg | Freq: Three times a day (TID) | INTRAVENOUS | Status: DC

## 2020-12-17 MED ORDER — HYDRALAZINE HCL 20 MG/ML IJ SOLN: 10.0000 mg | Freq: Four times a day (QID) | INTRAMUSCULAR | Status: DC | PRN

## 2020-12-17 MED ORDER — PANTOPRAZOLE SODIUM 40 MG IV SOLR
40.0000 mg | INTRAVENOUS | Status: DC
  Administered 2020-12-17: 40 mg via INTRAVENOUS
  Filled 2020-12-17: qty 40

## 2020-12-17 MED ORDER — CLOPIDOGREL BISULFATE 75 MG PO TABS: 75.0000 mg | ORAL_TABLET | Freq: Every day | ORAL | Status: DC

## 2020-12-17 MED ORDER — METOPROLOL TARTRATE 50 MG PO TABS: 50.0000 mg | ORAL_TABLET | Freq: Two times a day (BID) | ORAL | Status: DC

## 2020-12-17 NOTE — Consult Note (Signed)
Consultation Note Date: 12/17/2020   Patient Name: Sabrina Wheeler  DOB: 11/26/1945  MRN: [id number]  Age / Sex: 75 y.o., female  PCP: [facility] Referring Physician: Murlean Iba, MD  Reason for Consultation: Establishing goals of care  HPI/Patient Profile: 75 y.o. female  with past medical history of dementia, HTN, hyperlipidemia, F8MK, systolic and diastolic CHF, CKD stage IV admitted on 12/21/2020 with NSTEMI with limited options due to underlying CKD stage IV and dementia. Requiring ICU and BiPAP with ongoing chest pain.   Clinical Assessment and Goals of Care: I met today with Sabrina Wheeler's daughter, Sabrina Wheeler, while Sabrina Wheeler is being bathed. Sabrina Wheeler is very tearful and has just had a conversation with Dr. Audie Box and understands that her mother's prognosis is very poor. She is very tearful and tells me about both her parents health struggles and the difficulties of the past couple years. She is an only child. She does not want her mother to suffer. We reflect on the knowledge that she knew this day was coming but also is always difficult when it comes into present day it always seems a surprise.   We discussed plan to move forward to maintain current interventions for now while allowing time for family to gather and visit. No plans for escalation of care and will d/c BiPAP and nitro infusion once family allowed to visit and spend time with her. We discussed initiation of infusion to ensure comfort through transition and as needed medications for comfort in the meantime while family visiting. We discussed liberalized visitation during this time.   Update: I followed up with Sabrina Wheeler. Her father is visiting and once this is complete they will be prepared to start comfort infusion and once comfortable transition off BiPAP and nitro infusion. Discussed plan with Dr. Wynetta Emery, RN,  chaplain.   Primary Decision Maker NEXT OF KIN daughter    SUMMARY OF RECOMMENDATIONS   - DNR - Transition to full comfort care after family arrive and visit  Code Status/Advance Care Planning: DNR   Symptom Management:  Comfort medications ordered.   Palliative Prophylaxis:  Aspiration, Bowel Regimen, Delirium Protocol, Frequent Pain Assessment, Oral Care, Palliative Wound Care, and Turn Reposition  Additional Recommendations (Limitations, Scope, Preferences): Full Comfort Care  Psycho-social/Spiritual:  Desire for further Chaplaincy support:yes Additional Recommendations: Education on Hospice and Grief/Bereavement Support  Prognosis:  Hours - Days  Discharge Planning: Anticipated Hospital Death most likely but if she remains stabilized and comfortable we can consider hospice facility tomorrow but this is less likely.      Primary Diagnoses: Present on Admission:  Acute respiratory failure with hypoxia (HCC)  Essential hypertension  Mixed hyperlipidemia  CKD (chronic kidney disease) stage 4, GFR 15-29 ml/min (HCC)   I have reviewed the medical record, interviewed the patient and family, and examined the patient. The following aspects are pertinent.  Past Medical History:  Diagnosis Date   Alzheimer's dementia ([place])    Anemia    Broken ankle 2008   left  CAD (coronary artery disease)    Chronic combined systolic and diastolic CHF (congestive heart failure) (HCC)    CKD (chronic kidney disease), stage IV (HCC)    Diabetes mellitus without complication (HCC)    Heme positive stool    Hiatal hernia    Hypertension    Obesity    Renal disorder    Stage 4 kidney disease   Schatzki's ring    Social History   Socioeconomic History   Marital status: Married    Spouse name: Not on file   Number of children: Not on file   Years of education: Not on file   Highest education level: Not on file  Occupational History   Not on file  Tobacco Use   Smoking  status: Never   Smokeless tobacco: Never  Vaping Use   Vaping Use: Never used  Substance and Sexual Activity   Alcohol use: No   Drug use: No   Sexual activity: Not Currently  Other Topics Concern   Not on file  Social History Narrative   Not on file   Social Determinants of Health   Financial Resource Strain: Not on file  Food Insecurity: Not on file  Transportation Needs: Not on file  Physical Activity: Not on file  Stress: Not on file  Social Connections: Not on file   Family History  Problem Relation Age of Onset   Cancer Mother    Heart disease Father    Heart attack Father    Scheduled Meds:  Chlorhexidine Gluconate Cloth  6 each Topical Daily   furosemide  80 mg Intravenous Q12H   pantoprazole (PROTONIX) IV  40 mg Intravenous Q24H   Continuous Infusions:  nitroGLYCERIN 15 mcg/min (12/17/20 0800)   PRN Meds:.glycopyrrolate, HYDROmorphone (DILAUDID) injection, ondansetron (ZOFRAN) IV Allergies  Allergen Reactions   Macrobid [Nitrofurantoin Monohyd Macro] Other (See Comments)    Pt states "it paralyzed me"   Sulfamethoxazole-Trimethoprim Other (See Comments)    "paralyzed me"   Doxycycline Nausea And Vomiting   Clindamycin/Lincomycin Rash   Review of Systems  Unable to perform ROS: Dementia   Physical Exam Vitals and nursing note reviewed.  Constitutional:      General: She is not in acute distress.    Appearance: She is ill-appearing.  Cardiovascular:     Rate and Rhythm: Normal rate.  Pulmonary:     Comments: Requiring BiPAP Neurological:     Mental Status: She is alert.     Comments: Underlying dementia    Vital Signs: BP (!) 152/71   Pulse 83   Temp 99.9 F (37.7 C) (Axillary)   Resp 19   Ht $R'5\' 4"'AQ$  (1.626 m)   Wt 77.3 kg   SpO2 96%   BMI 29.25 kg/m  Pain Scale: 0-10   Pain Score: 0-No pain   SpO2: SpO2: 96 % O2 Device:SpO2: 96 % O2 Flow Rate: .O2 Flow Rate (L/min): 0 L/min  IO: Intake/output summary:  Intake/Output Summary  (Last 24 hours) at 12/17/2020 1149 Last data filed at 12/17/2020 0800 Gross per 24 hour  Intake 193.67 ml  Output 1850 ml  Net -1656.33 ml    LBM: Last BM Date: 12/16/20 Baseline Weight: Weight: 79 kg Most recent weight: Weight: 77.3 kg     Palliative Assessment/Data:     Time Total: 75 min  Greater than 50%  of this time was spent counseling and coordinating care related to the above assessment and plan.  Signed by: Vinie Sill, NP Palliative Medicine Team  Pager # [phone] (M-F 8a-5p) Team Phone # [phone] (Nights/Weekends)

## 2020-12-17 NOTE — Progress Notes (Signed)
Cardiology Progress Note  Patient ID: [name] MRN: [id number] DOB: 1945-06-14 Date of Encounter: 12/17/2020  Primary Cardiologist: Carlyle Dolly, MD  Subjective   Chief Complaint: Chest pain  HPI: Chest pain overnight.  Remains on nitro drip.  Resting comfortably on BiPAP at the time my examination.  ROS:  All other ROS reviewed and negative. Pertinent positives noted in the HPI.     Inpatient Medications  Scheduled Meds:  aspirin EC  81 mg Oral Daily   atorvastatin  80 mg Oral Daily   Chlorhexidine Gluconate Cloth  6 each Topical Daily   enoxaparin (LOVENOX) injection  1 mg/kg Subcutaneous Q24H   feeding supplement (GLUCERNA SHAKE)  237 mL Oral TID BM   ferrous sulfate  325 mg Oral BID PC   furosemide  40 mg Intravenous Q12H   metoprolol tartrate  5 mg Intravenous Q8H   pantoprazole (PROTONIX) IV  40 mg Intravenous Q24H   vitamin B-12  500 mcg Oral Daily   Continuous Infusions:  nitroGLYCERIN 15 mcg/min (12/17/20 0800)   PRN Meds: hydrALAZINE, ondansetron (ZOFRAN) IV   Vital Signs   Vitals:   12/17/20 0200 12/17/20 0453 12/17/20 0500 12/17/20 0748  BP: (!) 162/51 (!) 163/66 (!) 152/71   Pulse: 77 77 78 90  Resp: 19 20 (!) 22 (!) 27  Temp:    99.5 F (37.5 C)  TempSrc:    Axillary  SpO2: 96% 95% 97% 97%  Weight:      Height:        Intake/Output Summary (Last 24 hours) at 12/17/2020 0948 Last data filed at 12/17/2020 0513 Gross per 24 hour  Intake 185.2 ml  Output 1650 ml  Net -1464.8 ml   Last 3 Weights 12/16/2020 12/07/2020 11/28/2020  Weight (lbs) 170 lb 6.7 oz 174 lb 2.6 oz 173 lb 4.5 oz  Weight (kg) 77.3 kg 79 kg 78.6 kg      Telemetry  Overnight telemetry shows sinus rhythm in the 80s, PVCs noted, which I personally reviewed.   ECG  The most recent ECG shows sinus rhythm heart rate 85, anterolateral ST depressions, which I personally reviewed.   Physical Exam   Vitals:   12/17/20 0200 12/17/20 0453 12/17/20 0500 12/17/20 0748   BP: (!) 162/51 (!) 163/66 (!) 152/71   Pulse: 77 77 78 90  Resp: 19 20 (!) 22 (!) 27  Temp:    99.5 F (37.5 C)  TempSrc:    Axillary  SpO2: 96% 95% 97% 97%  Weight:      Height:        Intake/Output Summary (Last 24 hours) at 12/17/2020 0948 Last data filed at 12/17/2020 0513 Gross per 24 hour  Intake 185.2 ml  Output 1650 ml  Net -1464.8 ml    Last 3 Weights 12/16/2020 12/21/2020 11/28/2020  Weight (lbs) 170 lb 6.7 oz 174 lb 2.6 oz 173 lb 4.5 oz  Weight (kg) 77.3 kg 79 kg 78.6 kg    Body mass index is 29.25 kg/m.   General: Well nourished, well developed, in no acute distress Head: Atraumatic, normal size  Eyes: PEERLA, EOMI  Neck: Supple, no JVD Endocrine: No thryomegaly Cardiac: Normal S1, S2; RRR; no murmurs, rubs, or gallops Lungs: Diminished breath sounds bilaterally Abd: Soft, nontender, no hepatomegaly  Ext: Trace edema Musculoskeletal: No deformities, BUE and BLE strength normal and equal Skin: Warm and dry, no rashes   Neuro: Alert, awake, oriented to person and place (understands she is in the hospital)  Labs  High Sensitivity Troponin:   Recent Labs  Lab 12/23/2020 2122 01/05/2021 2357 12/16/20 0225 12/16/20 0354 12/17/20 0445  TROPONINIHS 2,277* 3,942* 5,949* 6,904* 4,978*     Cardiac EnzymesNo results for input(s): TROPONINI in the last 168 hours. No results for input(s): TROPIPOC in the last 168 hours.  Chemistry Recent Labs  Lab 12/13/2020 1739 12/16/20 0354 12/17/20 0445  NA 139 141 142  K 3.6 3.6 3.6  CL 108 111 109  CO2 23 23 23   GLUCOSE 157* 118* 145*  BUN 45* 45* 49*  CREATININE 2.52* 2.63* 2.70*  CALCIUM 8.1* 8.1* 8.3*  PROT 6.3* 5.4*  --   ALBUMIN 3.1* 2.7*  --   AST 23 44*  --   ALT 18 17  --   ALKPHOS 73 62  --   BILITOT 0.6 0.4  --   GFRNONAA 19* 18* 18*  ANIONGAP 8 7 10     Hematology Recent Labs  Lab 12/11/2020 1739 12/16/20 0354 12/17/20 0445  WBC 11.9* 9.4 16.1*  RBC 3.19* 2.88* 3.33*  HGB 9.4* 8.5* 10.0*  HCT  29.8* 27.5* 31.6*  MCV 93.4 95.5 94.9  MCH 29.5 29.5 30.0  MCHC 31.5 30.9 31.6  RDW 15.2 15.2 15.2  PLT 222 218 237   BNP Recent Labs  Lab 12/29/2020 1739  BNP 719.0*    DDimer No results for input(s): DDIMER in the last 168 hours.   Radiology  DG Chest Port 1 View  Result Date: 12/07/2020 CLINICAL DATA:  Burning chest pain, intermittent vomiting today EXAM: PORTABLE CHEST 1 VIEW COMPARISON:  11/25/2020 FINDINGS: Single frontal view of the chest demonstrates a stable cardiac silhouette. There is chronic central vascular congestion. Patchy bilateral areas of consolidation are most consistent with mild edema or multifocal pneumonia. No effusion or pneumothorax. No acute bony abnormalities. IMPRESSION: 1. Findings most consistent with mild congestive heart failure. Superimposed infection would be difficult to exclude. Electronically Signed   By: Randa Ngo M.D.   On: 12/09/2020 17:26   ECHOCARDIOGRAM COMPLETE  Result Date: 12/16/2020    ECHOCARDIOGRAM REPORT   Patient Name:   Sabrina Wheeler Date of Exam: 12/16/2020 Medical Rec #:  [id number]        Height:       62.0 in Accession #:    [id number]       Weight:       174.2 lb Date of Birth:  April 05, 1945        BSA:          1.803 m Patient Age:    75 years         BP:           153/85 mmHg Patient Gender: F                HR:           72 bpm. Exam Location:  [facility] Procedure: 2D Echo, Cardiac Doppler and Color Doppler Indications:    CHF-Acute Systolic  History:        Patient has prior history of Echocardiogram examinations, most                 recent 10/15/2020. CHF, Acute MI and CAD, Signs/Symptoms:Chest                 Pain; Risk Factors:Hypertension and Dyslipidemia.  Sonographer:    Wenda Low Referring Phys: [id number] OLADAPO ADEFESO  Sonographer Comments: TDS- Patient is in respiratory distress and is unable  to breathe when placed in position for the echo. Images acquired with patient sitting. IMPRESSIONS  1. Left ventricular  ejection fraction, by estimation, is 35 to 40%. The left ventricle has moderately decreased function. The left ventricle demonstrates global hypokinesis. There is mild left ventricular hypertrophy. Left ventricular diastolic parameters are consistent with Grade II diastolic dysfunction (pseudonormalization).  2. Right ventricular systolic function is normal. The right ventricular size is mildly enlarged. There is severely elevated pulmonary artery systolic pressure. The estimated right ventricular systolic pressure is 40.9 mmHg.  3. Left atrial size was moderately dilated.  4. The mitral valve is normal in structure. Mild mitral valve regurgitation. Mild mitral stenosis. The mean mitral valve gradient is 5.0 mmHg. Moderate mitral annular calcification.  5. The aortic valve is tricuspid. Aortic valve regurgitation is not visualized. No aortic stenosis is present.  6. The inferior vena cava is normal in size with <50% respiratory variability, suggesting right atrial pressure of 8 mmHg. Comparison(s): No significant change from prior study. Prior images reviewed side by side. FINDINGS  Left Ventricle: Left ventricular ejection fraction, by estimation, is 35 to 40%. The left ventricle has moderately decreased function. The left ventricle demonstrates global hypokinesis. The left ventricular internal cavity size was normal in size. There is mild left ventricular hypertrophy. Left ventricular diastolic parameters are consistent with Grade II diastolic dysfunction (pseudonormalization).  LV Wall Scoring: The entire apex is akinetic. Right Ventricle: The right ventricular size is mildly enlarged. No increase in right ventricular wall thickness. Right ventricular systolic function is normal. There is severely elevated pulmonary artery systolic pressure. The tricuspid regurgitant velocity is 3.71 m/s, and with an assumed right atrial pressure of 8 mmHg, the estimated right ventricular systolic pressure is 81.1 mmHg. Left  Atrium: Left atrial size was moderately dilated. Right Atrium: Right atrial size was normal in size. Pericardium: There is no evidence of pericardial effusion. Mitral Valve: The mitral valve is normal in structure. There is moderate thickening of the mitral valve leaflet(s). There is moderate calcification of the mitral valve leaflet(s). Moderate mitral annular calcification. Mild mitral valve regurgitation. Mild mitral valve stenosis. MV peak gradient, 13.0 mmHg. The mean mitral valve gradient is 5.0 mmHg. Tricuspid Valve: The tricuspid valve is normal in structure. Tricuspid valve regurgitation is trivial. No evidence of tricuspid stenosis. Aortic Valve: The aortic valve is tricuspid. Aortic valve regurgitation is not visualized. No aortic stenosis is present. Aortic valve mean gradient measures 7.0 mmHg. Aortic valve peak gradient measures 13.4 mmHg. Aortic valve area, by VTI measures 1.85  cm. Pulmonic Valve: The pulmonic valve was normal in structure. Pulmonic valve regurgitation is not visualized. No evidence of pulmonic stenosis. Aorta: The aortic root is normal in size and structure. Venous: The inferior vena cava is normal in size with less than 50% respiratory variability, suggesting right atrial pressure of 8 mmHg. IAS/Shunts: No atrial level shunt detected by color flow Doppler.  LEFT VENTRICLE PLAX 2D LVIDd:         5.30 cm      Diastology LVIDs:         4.70 cm      LV e' medial:    7.83 cm/s LV PW:         1.30 cm      LV E/e' medial:  22.2 LV IVS:        1.20 cm      LV e' lateral:   9.90 cm/s LVOT diam:     1.90 cm  LV E/e' lateral: 17.6 LV SV:         66 LV SV Index:   37 LVOT Area:     2.84 cm  LV Volumes (MOD) LV vol d, MOD A2C: 118.0 ml LV vol d, MOD A4C: 105.0 ml LV vol s, MOD A2C: 67.8 ml LV vol s, MOD A4C: 70.8 ml LV SV MOD A2C:     50.2 ml LV SV MOD A4C:     105.0 ml LV SV MOD BP:      43.4 ml RIGHT VENTRICLE RV Basal diam:  4.10 cm RV Mid diam:    4.30 cm RV S prime:     14.60 cm/s  TAPSE (M-mode): 3.0 cm LEFT ATRIUM             Index        RIGHT ATRIUM           Index LA diam:        4.40 cm 2.44 cm/m   RA Area:     12.80 cm LA Vol (A2C):   74.5 ml 41.33 ml/m  RA Volume:   27.30 ml  15.15 ml/m LA Vol (A4C):   52.3 ml 29.01 ml/m LA Biplane Vol: 64.2 ml 35.62 ml/m  AORTIC VALVE                     PULMONIC VALVE AV Area (Vmax):    1.72 cm      PV Vmax:       1.19 m/s AV Area (Vmean):   1.72 cm      PV Peak grad:  5.7 mmHg AV Area (VTI):     1.85 cm AV Vmax:           183.00 cm/s AV Vmean:          120.000 cm/s AV VTI:            0.357 m AV Peak Grad:      13.4 mmHg AV Mean Grad:      7.0 mmHg LVOT Vmax:         111.00 cm/s LVOT Vmean:        72.900 cm/s LVOT VTI:          0.233 m LVOT/AV VTI ratio: 0.65  AORTA Ao Root diam: 2.60 cm MITRAL VALVE                TRICUSPID VALVE MV Area (PHT): 4.17 cm     TR Peak grad:   55.1 mmHg MV Area VTI:   1.82 cm     TR Vmax:        371.00 cm/s MV Peak grad:  13.0 mmHg MV Mean grad:  5.0 mmHg     SHUNTS MV Vmax:       1.80 m/s     Systemic VTI:  0.23 m MV Vmean:      101.0 cm/s   Systemic Diam: 1.90 cm MV Decel Time: 182 msec MV E velocity: 174.00 cm/s Candee Furbish MD Electronically signed by Candee Furbish MD Signature Date/Time: 12/16/2020/5:28:59 PM    Final     Cardiac Studies  TTE 10/15/2020  1. Compared to echo report from 2017, LVEF is down and wall motion  changes are new.   2. There is hypokinesis of the lateral wall, distal anterior and akinesis  of the distal inferior, distal inferolateral, distal inferoseptal and  apical walls . Left ventricular ejection fraction, by estimation, is 45%.  The left  ventricular internal  cavity size was mildly dilated. There is mild left ventricular  hypertrophy. Left ventricular diastolic parameters are consistent with  Grade II diastolic dysfunction (pseudonormalization).   3. Right ventricular systolic function is low normal. The right  ventricular size is normal. There is moderately elevated  pulmonary artery  systolic pressure.   4. Left atrial size was moderately dilated.   5. Mild mitral valve regurgitation.   6. The aortic valve is tricuspid. Aortic valve regurgitation is not  visualized. Mild to moderate aortic valve sclerosis/calcification is  present, without any evidence of aortic stenosis.   7. The inferior vena cava is dilated in size with >50% respiratory  variability, suggesting right atrial pressure of 8 mmHg.   TTE 12/16/2020  1. Left ventricular ejection fraction, by estimation, is 35 to 40%. The  left ventricle has moderately decreased function. The left ventricle  demonstrates global hypokinesis. There is mild left ventricular  hypertrophy. Left ventricular diastolic  parameters are consistent with Grade II diastolic dysfunction  (pseudonormalization).   2. Right ventricular systolic function is normal. The right ventricular  size is mildly enlarged. There is severely elevated pulmonary artery  systolic pressure. The estimated right ventricular systolic pressure is  02.5 mmHg.   3. Left atrial size was moderately dilated.   4. The mitral valve is normal in structure. Mild mitral valve  regurgitation. Mild mitral stenosis. The mean mitral valve gradient is 5.0  mmHg. Moderate mitral annular calcification.   5. The aortic valve is tricuspid. Aortic valve regurgitation is not  visualized. No aortic stenosis is present.   6. The inferior vena cava is normal in size with <50% respiratory  variability, suggesting right atrial pressure of 8 mmHg.   LHC 2016 Left main: Normal LAD: 95% proximal lesion status post PCI Diagonal: 25% Circumflex: 25% OM: 25% RCA: Proximal 25%, mid 50% PDA: Normal  Patient Profile  BERENICE OEHLERT is a 75 y.o. female with Alzheimer's dementia, CAD (non-STEMI in 2016 status post PCI to the proximal LAD), chronic systolic heart failure, CKD stage IV, mild carotid artery disease, diabetes, history of GI bleed who was admitted on  12/16/2020 with non-STEMI.  After discussion with family medical management has been recommended due to her significant kidney disease as well as dementia.  Assessment & Plan   #NSTEMI #CAD s/p prior PCI 2016 -Admitted with non-STEMI.  Troponin has peaked at 6904 and trending down. -Echocardiogram shows EF has dropped to 35% from 45%. -EKG with anterolateral ST depressions. -She continues to have intermittent episodes of chest pain.  She is on nitro drip. -Due to significant dementia as well as CKD stage IV medical therapy was recommended.  This was decided with Dr. Candee Furbish and her daughter.  Please see documentation from that.  I do agree.  Unfortunately all we can offer her is medical therapy.  Invasive angiography would surely result in worsening kidney failure.  I think this would result in her rather rapid decline.  She is not a great candidate for hemodialysis given advanced dementia and now an ischemic cardiomyopathy. -I think Lovenox is not a good choice here.  Given her significant kidney disease I will switch her to heparin.  Her creatinine did bump.  We will plan for 48 hours of heparin therapy. -She is on aspirin 81 mg daily.  Since we are pursuing medical therapy we will add Plavix 75 mg daily. -She is on a high intensity statin. -BP not controlled.  This could be contributing.  We will transition to metoprolol tartrate 50 mg twice daily.  I have added Imdur 60 mg daily.  We will add hydralazine to treat her cardiomyopathy. -If she continues to have chest pain we may need to consider hospice care.  I really believe cardiac catheterization is not the best option for her.  #Ischemic CM, EF35% #Acute hypoxic respiratory failure #Pulmonary edema #CKD stage IV -Chest x-ray with persistent pulmonary edema.  Increase Lasix to 80 mg IV twice daily. -She cannot be on IV beta-blocker.  I have ordered 50 mg of metoprolol to tartrate when she can tolerate oral agents. -Not a candidate for  ACE/ARB/Arni/MRA given significant CKD.  She will need to be on hydralazine and Imdur when able. She needs this for afterload reduction.  -We will continue with diuresis as we are able.  If she does not improve we may be in a very tough spot.  She is likely not a good candidate for hemodialysis.  We will see what we can do with medications for now.  #Goals of Care -She is now in multiorgan system failure with advanced dementia.  She does have significant kidney dysfunction that precludes aggressive volume removal.  Her persistent chest pain from non-STEMI is also concerning.  She is not a candidate for invasive angiography due to dementia and significant kidney disease.  She is currently DNR/DNI.  I do agree with this.  I will have a discussion with the daughter today.  I suspect she is likely progressing to hospice needs.  We will continue to follow along while she is here.  CRITICAL CARE Performed by: [place] Bells T O'Neal  Total critical care time: 45 minutes. Critical care time was exclusive of separately billable procedures and treating other patients. Critical care was necessary to treat or prevent imminent or life-threatening deterioration. Critical care was time spent personally by me on the following activities: development of treatment plan with patient and/or surrogate as well as nursing, discussions with consultants, evaluation of patient's response to treatment, examination of patient, obtaining history from patient or surrogate, ordering and performing treatments and interventions, ordering and review of laboratory studies, ordering and review of radiographic studies, pulse oximetry and re-evaluation of patient's condition.    Signed, Addison Naegeli. Audie Box, MD, [place]  12/17/2020 9:48 AM

## 2020-12-17 NOTE — Progress Notes (Signed)
PROGRESS NOTE    ELLIA KNOWLTON  HQI:[id number] DOB: May 12, 1945 DOA: 12/23/2020 PCP: [facility]   Brief Narrative:   MADDALENA LINAREZ is a 75 y.o. female with medical history significant for HTN, hyperlipidemia, X3KG, systolic and diastolic CHF, CKD stage IV who presents to the emergency department via EMS due to complaint of chest pain.  Patient denies chest pain at bedside and also denies shortness of breath despite being on a BiPAP.  She appears to be an unclear historian given her dementia.  She has been admitted with NSTEMI and associated acute on chronic combined CHF with acute hypoxemic respiratory failure.  Given her comorbidities, it has been decided by cardiology to manage her conservatively at this time.  Assessment & Plan:   Principal Problem:   Acute exacerbation of CHF (congestive heart failure) (HCC) Active Problems:   Essential hypertension   Mixed hyperlipidemia   Acute respiratory failure with hypoxia (HCC)   Anemia of chronic disease   Elevated troponin I level   CKD (chronic kidney disease) stage 4, GFR 15-29 ml/min (HCC)   NSTEMI (non-ST elevated myocardial infarction) (HCC)   Elevated brain natriuretic peptide (BNP) level   Hypoalbuminemia   Hyperglycemia   Leukocytosis   CAD (coronary artery disease)   GERD (gastroesophageal reflux disease)   NSTEMI in the setting of known CAD -Prior PCI 2016 -Continue medical management as ordered with full dose Lovenox -Continue nitroglycerin drip and BiPAP -Continue statin, aspirin, and metoprolol, plavix for now  -Appreciate ongoing cardiology recommendations -After much discussion with family and palliative care team, planning transition to full comfort measures later today   Acute hypoxemic respiratory failure secondary to acute on chronic combined CHF secondary to above -Continue IV Lasix as ordered -Continue beta-blocker -Recent echo 10/15/20 with EF 45% suggesting ischemic  cardiomyopathy -Monitor daily weights and strict I's and O's  CKD stage IV -Baseline creatinine two-point 5-3 -Continue to monitor -Not a candidate for catheterization  Chronic anemia -Continue to monitor -Recent Hemoccult positive and GI work-up noted  Alzheimer's dementia -Appreciate palliative consultation -Planning transition to full comfort measures   Hyperglycemia -Last A1c 8/22 5.9% -Continue to monitor -Not currently on diabetes medications  GERD -PPI   DVT prophylaxis: Full dose Lovenox Code Status: DNR Family Communication: palliative care discussions with family Disposition Plan: planning transition to full comfort measures later today  Status is: Inpatient  Remains inpatient appropriate because:Hemodynamically unstable, IV treatments appropriate due to intensity of illness or inability to take PO, and Inpatient level of care appropriate due to severity of illness  Dispo: The patient is from: Home              Anticipated d/c is to: Home              Patient currently is not medically stable to d/c.   Difficult to place patient No  Skin Assessment:  I have examined the patient's skin and I agree with the wound assessment as performed by the wound care RN as outlined below:  Pressure Injury 11/25/20 Buttocks Right;Medial;Mid Stage 1 -  Intact skin with non-blanchable redness of a localized area usually over a bony prominence. (Active)  11/25/20 0939  Location: Buttocks  Location Orientation: Right;Medial;Mid  Staging: Stage 1 -  Intact skin with non-blanchable redness of a localized area usually over a bony prominence.  Wound Description (Comments):   Present on Admission: Yes     Pressure Injury 11/25/20 Buttocks Left;Medial;Mid Stage 1 -  Intact skin with non-blanchable redness of a localized area usually over a bony prominence. (Active)  11/25/20 0941  Location: Buttocks  Location Orientation: Left;Medial;Mid  Staging: Stage 1 -  Intact skin with  non-blanchable redness of a localized area usually over a bony prominence.  Wound Description (Comments):   Present on Admission: Yes    Consultants:  Cardiology Palliative care  Procedures:  See below  Antimicrobials:  None   Subjective: Patient still having intermittent chest pain symptoms.  Remains on bipap.   Objective: Vitals:   12/17/20 0453 12/17/20 0500 12/17/20 0748 12/17/20 1124  BP: (!) 163/66 (!) 152/71    Pulse: 77 78 90 83  Resp: 20 (!) 22 (!) 27 19  Temp:   99.5 F (37.5 C) 99.9 F (37.7 C)  TempSrc:   Axillary Axillary  SpO2: 95% 97% 97% 96%  Weight:      Height:        Intake/Output Summary (Last 24 hours) at 12/17/2020 1258 Last data filed at 12/17/2020 0800 Gross per 24 hour  Intake 193.67 ml  Output 1850 ml  Net -1656.33 ml   Filed Weights   12/23/2020 1526 12/16/20 1745  Weight: 79 kg 77.3 kg    Examination:  General exam: Pt awake on bipap, able to answer questions.   Respiratory system: on bipap, shallow BS bilateral.  Cardiovascular system: normal S1 & S2 heard.  No M/R/G.  Gastrointestinal system: Abdomen is soft, ND/NT, no HSM.   Central nervous system: Alert and awake.    Extremities: No edema. No cyanosis.  NO clubbing seen.   Skin: No gross lesions noted Psychiatry: Flat affect.  Data Reviewed: I have personally reviewed following labs and imaging studies  CBC: Recent Labs  Lab 12/16/2020 1739 12/16/20 0354 12/17/20 0445  WBC 11.9* 9.4 16.1*  NEUTROABS 10.5*  --   --   HGB 9.4* 8.5* 10.0*  HCT 29.8* 27.5* 31.6*  MCV 93.4 95.5 94.9  PLT 222 218 825   Basic Metabolic Panel: Recent Labs  Lab 12/10/2020 1739 12/16/20 0354 12/17/20 0445  NA 139 141 142  K 3.6 3.6 3.6  CL 108 111 109  CO2 23 23 23   GLUCOSE 157* 118* 145*  BUN 45* 45* 49*  CREATININE 2.52* 2.63* 2.70*  CALCIUM 8.1* 8.1* 8.3*  MG  --  1.8 1.8  PHOS  --  5.5*  --    GFR: Estimated Creatinine Clearance: 17.8 mL/min (A) (by C-G formula based on SCr  of 2.7 mg/dL (H)). Liver Function Tests: Recent Labs  Lab 12/31/2020 1739 12/16/20 0354  AST 23 44*  ALT 18 17  ALKPHOS 73 62  BILITOT 0.6 0.4  PROT 6.3* 5.4*  ALBUMIN 3.1* 2.7*   No results for input(s): LIPASE, AMYLASE in the last 168 hours. No results for input(s): AMMONIA in the last 168 hours. Coagulation Profile: Recent Labs  Lab 12/16/20 0354  INR 1.3*   Cardiac Enzymes: No results for input(s): CKTOTAL, CKMB, CKMBINDEX, TROPONINI in the last 168 hours. BNP (last 3 results) No results for input(s): PROBNP in the last 8760 hours. HbA1C: No results for input(s): HGBA1C in the last 72 hours. CBG: No results for input(s): GLUCAP in the last 168 hours. Lipid Profile: No results for input(s): CHOL, HDL, LDLCALC, TRIG, CHOLHDL, LDLDIRECT in the last 72 hours. Thyroid Function Tests: No results for input(s): TSH, T4TOTAL, FREET4, T3FREE, THYROIDAB in the last 72 hours. Anemia Panel: No results for input(s): VITAMINB12, FOLATE, FERRITIN, TIBC, IRON, RETICCTPCT in  the last 72 hours. Sepsis Labs: Recent Labs  Lab 12/16/20 0354  PROCALCITON <0.10    Recent Results (from the past 240 hour(s))  Resp Panel by RT-PCR (Flu A&B, Covid) Nasopharyngeal Swab     Status: None   Collection Time: 12/17/2020  6:25 PM   Specimen: Nasopharyngeal Swab; Nasopharyngeal(NP) swabs in vial transport medium  Result Value Ref Range Status   SARS Coronavirus 2 by RT PCR NEGATIVE NEGATIVE Final    Comment: (NOTE) SARS-CoV-2 target nucleic acids are NOT DETECTED.  The SARS-CoV-2 RNA is generally detectable in upper respiratory specimens during the acute phase of infection. The lowest concentration of SARS-CoV-2 viral copies this assay can detect is 138 copies/mL. A negative result does not preclude SARS-Cov-2 infection and should not be used as the sole basis for treatment or other patient management decisions. A negative result may occur with  improper specimen collection/handling, submission  of specimen other than nasopharyngeal swab, presence of viral mutation(s) within the areas targeted by this assay, and inadequate number of viral copies(<138 copies/mL). A negative result must be combined with clinical observations, patient history, and epidemiological information. The expected result is Negative.  Fact Sheet for Patients:  EntrepreneurPulse.com.au  Fact Sheet for Healthcare Providers:  IncredibleEmployment.be  This test is no t yet approved or cleared by the [place] FDA and  has been authorized for detection and/or diagnosis of SARS-CoV-2 by FDA under an Emergency Use Authorization (EUA). This EUA will remain  in effect (meaning this test can be used) for the duration of the COVID-19 declaration under Section 564(b)(1) of the Act, 21 U.S.C.section 360bbb-3(b)(1), unless the authorization is terminated  or revoked sooner.       Influenza A by PCR NEGATIVE NEGATIVE Final   Influenza B by PCR NEGATIVE NEGATIVE Final    Comment: (NOTE) The Xpert Xpress SARS-CoV-2/FLU/RSV plus assay is intended as an aid in the diagnosis of influenza from Nasopharyngeal swab specimens and should not be used as a sole basis for treatment. Nasal washings and aspirates are unacceptable for Xpert Xpress SARS-CoV-2/FLU/RSV testing.  Fact Sheet for Patients: EntrepreneurPulse.com.au  Fact Sheet for Healthcare Providers: IncredibleEmployment.be  This test is not yet approved or cleared by the [place] FDA and has been authorized for detection and/or diagnosis of SARS-CoV-2 by FDA under an Emergency Use Authorization (EUA). This EUA will remain in effect (meaning this test can be used) for the duration of the COVID-19 declaration under Section 564(b)(1) of the Act, 21 U.S.C. section 360bbb-3(b)(1), unless the authorization is terminated or revoked.  Performed at [facility], [address]  [place], [place] [address]   MRSA Next Gen by PCR, Nasal     Status: None   Collection Time: 12/16/20  4:05 PM   Specimen: Nasal Mucosa; Nasal Swab  Result Value Ref Range Status   MRSA by PCR Next Gen NOT DETECTED NOT DETECTED Final    Comment: (NOTE) The GeneXpert MRSA Assay (FDA approved for NASAL specimens only), is one component of a comprehensive MRSA colonization surveillance program. It is not intended to diagnose MRSA infection nor to guide or monitor treatment for MRSA infections. Test performance is not FDA approved in patients less than 16 years old. Performed at [facility], [address], [place], [place] [address]    Radiology Studies: DG CHEST PORT 1 VIEW  Result Date: 12/17/2020 CLINICAL DATA:  Shortness of breath. EXAM: PORTABLE CHEST 1 VIEW COMPARISON:  December 15, 2020. FINDINGS: Mild cardiomegaly is noted. No pneumothorax or  pleural effusion is noted. Increased left upper and right upper and lower lobe opacities are noted concerning for multifocal pneumonia. Bony thorax is unremarkable. IMPRESSION: Increased bilateral lung opacities are noted concerning for multifocal pneumonia. Electronically Signed   By: Marijo Conception M.D.   On: 12/17/2020 10:41   DG Chest Port 1 View  Result Date: 01/02/2021 CLINICAL DATA:  Burning chest pain, intermittent vomiting today EXAM: PORTABLE CHEST 1 VIEW COMPARISON:  11/25/2020 FINDINGS: Single frontal view of the chest demonstrates a stable cardiac silhouette. There is chronic central vascular congestion. Patchy bilateral areas of consolidation are most consistent with mild edema or multifocal pneumonia. No effusion or pneumothorax. No acute bony abnormalities. IMPRESSION: 1. Findings most consistent with mild congestive heart failure. Superimposed infection would be difficult to exclude. Electronically Signed   By: Randa Ngo M.D.   On: 12/13/2020 17:26   ECHOCARDIOGRAM COMPLETE  Result Date: 12/16/2020    ECHOCARDIOGRAM REPORT    Patient Name:   NOEMI BELLISSIMO Date of Exam: 12/16/2020 Medical Rec #:  [id number]        Height:       62.0 in Accession #:    [id number]       Weight:       174.2 lb Date of Birth:  05/21/1945        BSA:          1.803 m Patient Age:    89 years         BP:           153/85 mmHg Patient Gender: F                HR:           72 bpm. Exam Location:  [facility] Procedure: 2D Echo, Cardiac Doppler and Color Doppler Indications:    CHF-Acute Systolic  History:        Patient has prior history of Echocardiogram examinations, most                 recent 10/15/2020. CHF, Acute MI and CAD, Signs/Symptoms:Chest                 Pain; Risk Factors:Hypertension and Dyslipidemia.  Sonographer:    Wenda Low Referring Phys: [id number] OLADAPO ADEFESO  Sonographer Comments: TDS- Patient is in respiratory distress and is unable to breathe when placed in position for the echo. Images acquired with patient sitting. IMPRESSIONS  1. Left ventricular ejection fraction, by estimation, is 35 to 40%. The left ventricle has moderately decreased function. The left ventricle demonstrates global hypokinesis. There is mild left ventricular hypertrophy. Left ventricular diastolic parameters are consistent with Grade II diastolic dysfunction (pseudonormalization).  2. Right ventricular systolic function is normal. The right ventricular size is mildly enlarged. There is severely elevated pulmonary artery systolic pressure. The estimated right ventricular systolic pressure is 64.3 mmHg.  3. Left atrial size was moderately dilated.  4. The mitral valve is normal in structure. Mild mitral valve regurgitation. Mild mitral stenosis. The mean mitral valve gradient is 5.0 mmHg. Moderate mitral annular calcification.  5. The aortic valve is tricuspid. Aortic valve regurgitation is not visualized. No aortic stenosis is present.  6. The inferior vena cava is normal in size with <50% respiratory variability, suggesting right atrial pressure of 8 mmHg.  Comparison(s): No significant change from prior study. Prior images reviewed side by side. FINDINGS  Left Ventricle: Left ventricular ejection fraction, by estimation, is 35 to 40%. The  left ventricle has moderately decreased function. The left ventricle demonstrates global hypokinesis. The left ventricular internal cavity size was normal in size. There is mild left ventricular hypertrophy. Left ventricular diastolic parameters are consistent with Grade II diastolic dysfunction (pseudonormalization).  LV Wall Scoring: The entire apex is akinetic. Right Ventricle: The right ventricular size is mildly enlarged. No increase in right ventricular wall thickness. Right ventricular systolic function is normal. There is severely elevated pulmonary artery systolic pressure. The tricuspid regurgitant velocity is 3.71 m/s, and with an assumed right atrial pressure of 8 mmHg, the estimated right ventricular systolic pressure is 25.3 mmHg. Left Atrium: Left atrial size was moderately dilated. Right Atrium: Right atrial size was normal in size. Pericardium: There is no evidence of pericardial effusion. Mitral Valve: The mitral valve is normal in structure. There is moderate thickening of the mitral valve leaflet(s). There is moderate calcification of the mitral valve leaflet(s). Moderate mitral annular calcification. Mild mitral valve regurgitation. Mild mitral valve stenosis. MV peak gradient, 13.0 mmHg. The mean mitral valve gradient is 5.0 mmHg. Tricuspid Valve: The tricuspid valve is normal in structure. Tricuspid valve regurgitation is trivial. No evidence of tricuspid stenosis. Aortic Valve: The aortic valve is tricuspid. Aortic valve regurgitation is not visualized. No aortic stenosis is present. Aortic valve mean gradient measures 7.0 mmHg. Aortic valve peak gradient measures 13.4 mmHg. Aortic valve area, by VTI measures 1.85  cm. Pulmonic Valve: The pulmonic valve was normal in structure. Pulmonic valve regurgitation is  not visualized. No evidence of pulmonic stenosis. Aorta: The aortic root is normal in size and structure. Venous: The inferior vena cava is normal in size with less than 50% respiratory variability, suggesting right atrial pressure of 8 mmHg. IAS/Shunts: No atrial level shunt detected by color flow Doppler.  LEFT VENTRICLE PLAX 2D LVIDd:         5.30 cm      Diastology LVIDs:         4.70 cm      LV e' medial:    7.83 cm/s LV PW:         1.30 cm      LV E/e' medial:  22.2 LV IVS:        1.20 cm      LV e' lateral:   9.90 cm/s LVOT diam:     1.90 cm      LV E/e' lateral: 17.6 LV SV:         66 LV SV Index:   37 LVOT Area:     2.84 cm  LV Volumes (MOD) LV vol d, MOD A2C: 118.0 ml LV vol d, MOD A4C: 105.0 ml LV vol s, MOD A2C: 67.8 ml LV vol s, MOD A4C: 70.8 ml LV SV MOD A2C:     50.2 ml LV SV MOD A4C:     105.0 ml LV SV MOD BP:      43.4 ml RIGHT VENTRICLE RV Basal diam:  4.10 cm RV Mid diam:    4.30 cm RV S prime:     14.60 cm/s TAPSE (M-mode): 3.0 cm LEFT ATRIUM             Index        RIGHT ATRIUM           Index LA diam:        4.40 cm 2.44 cm/m   RA Area:     12.80 cm LA Vol (A2C):   74.5 ml 41.33 ml/m  RA Volume:  27.30 ml  15.15 ml/m LA Vol (A4C):   52.3 ml 29.01 ml/m LA Biplane Vol: 64.2 ml 35.62 ml/m  AORTIC VALVE                     PULMONIC VALVE AV Area (Vmax):    1.72 cm      PV Vmax:       1.19 m/s AV Area (Vmean):   1.72 cm      PV Peak grad:  5.7 mmHg AV Area (VTI):     1.85 cm AV Vmax:           183.00 cm/s AV Vmean:          120.000 cm/s AV VTI:            0.357 m AV Peak Grad:      13.4 mmHg AV Mean Grad:      7.0 mmHg LVOT Vmax:         111.00 cm/s LVOT Vmean:        72.900 cm/s LVOT VTI:          0.233 m LVOT/AV VTI ratio: 0.65  AORTA Ao Root diam: 2.60 cm MITRAL VALVE                TRICUSPID VALVE MV Area (PHT): 4.17 cm     TR Peak grad:   55.1 mmHg MV Area VTI:   1.82 cm     TR Vmax:        371.00 cm/s MV Peak grad:  13.0 mmHg MV Mean grad:  5.0 mmHg     SHUNTS MV Vmax:       1.80  m/s     Systemic VTI:  0.23 m MV Vmean:      101.0 cm/s   Systemic Diam: 1.90 cm MV Decel Time: 182 msec MV E velocity: 174.00 cm/s Candee Furbish MD Electronically signed by Candee Furbish MD Signature Date/Time: 12/16/2020/5:28:59 PM    Final     Scheduled Meds:  Chlorhexidine Gluconate Cloth  6 each Topical Daily   furosemide  80 mg Intravenous Q12H   pantoprazole (PROTONIX) IV  40 mg Intravenous Q24H   Continuous Infusions:  nitroGLYCERIN 15 mcg/min (12/17/20 0800)     LOS: 2 days   Critical Care Procedure Note Authorized and Performed by: Murvin Natal MD  Total Critical Care time:  55 mins Due to a high probability of clinically significant, life threatening deterioration, the patient required my highest level of preparedness to intervene emergently and I personally spent this critical care time directly and personally managing the patient.  This critical care time included obtaining a history; examining the patient, pulse oximetry; ordering and review of studies; arranging urgent treatment with development of a management plan; evaluation of patient's response of treatment; frequent reassessment; and discussions with other providers.  This critical care time was performed to assess and manage the high probability of imminent and life threatening deterioration that could result in multi-organ failure.  It was exclusive of separately billable procedures and treating other patients and teaching time.    Irwin Brakeman, MD How to contact the [facility] Attending or Consulting provider [place] or covering provider during after hours [place], for this patient?  Check the care team in [facility] and look for a) attending/consulting [facility] provider listed and b) the [facility] 2 team listed Log into [email or web address] and use [place]'s universal password to access. If you do not have the password, please contact  the hospital operator. Locate the [facility] provider you are looking for under [facility] and page to a number that  you can be directly reached. If you still have difficulty reaching the provider, please page the [facility] (Director on Call) for the Hospitalists listed on amion for assistance.  [email or web address] 12/17/2020, 12:58 PM

## 2020-12-17 NOTE — Progress Notes (Signed)
Met with daughter and husband today along with patient brother and pastor. They have decided for comfort care at this time and was able to relay to Mrs. Keplinger her prognosis. She was tearful as was the entire family but it was healing as well or her to have the opportunity to talk to them and to hold their hand and tell them she loved them today. She is well supported by her family and the family is well supported by their church family and friends in the community. Chaplain provided safe space for spiritual reflection and opportunity for life review. Will remain available in order to provide spiritual support and to assess for spiritual need.   Rev. Bennie Pierini, M.Div. [phone]

## 2020-12-17 NOTE — Progress Notes (Signed)
[place] for Lovenox=> heparin Indication: chest pain/ACS  HEPARIN DW (KG): 71.1   Labs: Recent Labs    01/04/2021 1739 12/16/20 0354 12/17/20 0445  HGB 9.4* 8.5* 10.0*  HCT 29.8* 27.5* 31.6*  PLT 222 218 237  APTT  --  35  --   LABPROT  --  16.1*  --   INR  --  1.3*  --   CREATININE 2.52* 2.63* 2.70*     Assessment: 22 yof presenting with CP, elevated troponin. Pharmacy consulted to dose Lovenox for ACS, now cardiology transitioning to heparin. Last dose of lovenox 10/11 2100. Patient to transfer to [facility] for Cardiology consult. Patient is not on anticoagulation PTA. Hg 9.4, plt wnl. SCr 2.52 on presentation (appears to be at baseline), CrCl <30. No active bleed issues reported.  Goal of Therapy:   Monitor platelets by anticoagulation protocol: Yes   Plan:  Start heparin infusion at 900 units/hr at 2100 Check anti-Xa level in ~8 hours and daily while on heparin Continue to monitor H&H and platelets F/u Cardiology plans  Isac Sarna, [place], [place] Pharmacist Pager [phone] 12/17/2020 9:54 AM

## 2020-12-17 NOTE — Progress Notes (Signed)
**Note De-Identified  Obfuscation** RT note: Attempted to remove patient from BIPAP this AM due to decrease in SAT placed back on BIPAP.  Patient complaining of chest pain; EKG done and placed in patient chart MD aware.  Mepilex placed across bridge of nose due to skin breakdown.  RRT to continue to monitor.

## 2020-12-19 ENCOUNTER — Encounter (HOSPITAL_COMMUNITY): Payer: Self-pay | Admitting: Internal Medicine

## 2021-01-05 ENCOUNTER — Ambulatory Visit: Payer: Medicare Other | Admitting: Physician Assistant

## 2021-01-06 NOTE — Death Summary Note (Addendum)
DEATH SUMMARY   Patient Details  Name: Sabrina Wheeler MRN: [id number] DOB: 1945-12-29  Admission/Discharge Information   Admit Date:  Dec 20, 2020  Date of Death: Date of Death: 12/23/20  Time of Death: Time of Death: 0733  Length of Stay: 3  Referring Physician: [facility]   Reason(s) for Hospitalization  Sabrina Wheeler is a 75 y.o. female with medical history significant for HTN, hyperlipidemia, Q9UT, systolic and diastolic CHF, CKD stage IV who presents to the emergency department via EMS due to complaint of chest pain.  Patient denies chest pain at bedside and also denies shortness of breath despite being on a BiPAP.  Rest of the history was obtained from ED physician and ED medical record.  Per report, chest pain which started today was described as burning and aching like sensation, this was associated with some shortness of breath and it was of moderate intensity with no aggravating/aggravating factor.  She was also reported to have vomited today, EMS was activated, and on arrival of EMS team, patient was noted to have O2 sats in low 80s on room air and she was placed on supplemental oxygen via [place] at 2 LPM with improved oxygenation, she was then transferred to ED for further evaluation and management.   In the emergency department, patient was intermittently tachypneic, BP was 174/72 and O2 sat was 88% on room air and this momentarily improved with supplemental oxygen.  Shortly after being in the ED, patient's O2 sat became desaturated and was in respiratory distress, she was placed on BiPAP with improved work of breathing. Work-up in the ED showed leukocytosis and normocytic anemia, BUN/creatinine at 45/2.52 (baseline at 2.6-3.0), BNP 719, troponin x1 558, albumin 3.1.  Influenza A, B, SARS coronavirus 2 was negative. Chest x-ray showed findings most consistent with mild congestive heart failure.  Superimposed infection will be difficult to exclude.  She was  treated with IV Lasix 40 mg x 2, patient was started on IV nitroglycerin drip, IV Zofran was given.  Cardiologist at [facility] Dr. Margaretann Loveless was consulted and recommended admitting patient to [facility] and had a cardiology group with consult on her on arrival to [facility] per ED physician.  Diagnoses  Preliminary cause of death: MYOCARDIAL INFARCTION Secondary Diagnoses (including complications and co-morbidities):  Principal Problem:   Acute exacerbation of CHF (congestive heart failure) (HCC) Active Problems:   Essential hypertension   Mixed hyperlipidemia   Acute respiratory failure with hypoxia (HCC)   Anemia of chronic disease   Elevated troponin I level   CKD (chronic kidney disease) stage 4, GFR 15-29 ml/min (HCC)   NSTEMI (non-ST elevated myocardial infarction) (HCC)   Elevated brain natriuretic peptide (BNP) level   Hypoalbuminemia   Hyperglycemia   Leukocytosis   CAD (coronary artery disease)   GERD (gastroesophageal reflux disease)   Multifocal pneumonia   Brief Hospital Course (including significant findings, care, treatment, and services provided and events leading to death)  Sabrina Wheeler is a 75 y.o. year old female who presented with NSTEMI and Heart Failure.    NSTEMI in the setting of known CAD -Prior PCI June 03, 2014 -Continue medical management as ordered with full dose Lovenox -Continue nitroglycerin drip and BiPAP -Continue statin, aspirin, and metoprolol, plavix for now  -Appreciate ongoing cardiology recommendations, not candidate for cath and recommended medical management only and no need to transfer to [facility].   -After much discussion with family and palliative care team, transitioned to full comfort measures  Acute hypoxemic respiratory failure secondary to acute on chronic combined Heart Failure secondary to above -Initially treated with IV Lasix until transitioned to full comfort measures  -initially treated with beta-blocker -Recent echo 10/15/20 with EF  45% suggesting ischemic cardiomyopathy -Monitored daily weights and strict I's and O's   CKD stage IV -Baseline creatinine two-point 5-3 -Continue to monitor -Not a candidate for catheterization   Chronic anemia -Continue to monitor -Recent Hemoccult positive and GI work-up noted   Alzheimer's dementia -Appreciate palliative consultation -transitioned to full comfort measures    Hyperglycemia -Last A1c 8/22 5.9% -Continue to monitor -Not currently on diabetes medications   GERD -PPI   DNR - transitioned to full comfort care measures     Pertinent Labs and Studies  Significant Diagnostic Studies DG CHEST PORT 1 VIEW  Result Date: 12/17/2020 CLINICAL DATA:  Shortness of breath. EXAM: PORTABLE CHEST 1 VIEW COMPARISON:  December 15, 2020. FINDINGS: Mild cardiomegaly is noted. No pneumothorax or pleural effusion is noted. Increased left upper and right upper and lower lobe opacities are noted concerning for multifocal pneumonia. Bony thorax is unremarkable. IMPRESSION: Increased bilateral lung opacities are noted concerning for multifocal pneumonia. Electronically Signed   By: Marijo Conception M.D.   On: 12/17/2020 10:41   DG Chest Port 1 View  Result Date: 12/16/2020 CLINICAL DATA:  Burning chest pain, intermittent vomiting today EXAM: PORTABLE CHEST 1 VIEW COMPARISON:  11/25/2020 FINDINGS: Single frontal view of the chest demonstrates a stable cardiac silhouette. There is chronic central vascular congestion. Patchy bilateral areas of consolidation are most consistent with mild edema or multifocal pneumonia. No effusion or pneumothorax. No acute bony abnormalities. IMPRESSION: 1. Findings most consistent with mild congestive heart failure. Superimposed infection would be difficult to exclude. Electronically Signed   By: Randa Ngo M.D.   On: 01/01/2021 17:26   DG Chest Portable 1 View  Result Date: 11/25/2020 CLINICAL DATA:  75 year old female with severe shortness of breath  this morning. EXAM: PORTABLE CHEST 1 VIEW COMPARISON:  Chest radiographs 11/11/2020 and earlier. FINDINGS: Portable AP upright view at 0625 hours. Lower lung volumes. Stable mild cardiomegaly. Other mediastinal contours are within normal limits. Visualized tracheal air column is within normal limits. Increased bilateral basilar predominant indistinct pulmonary interstitial opacity. Increased blunting of the right costophrenic angle. No superimposed pneumothorax or consolidation. No acute osseous abnormality identified. Paucity of bowel gas in the upper abdomen. IMPRESSION: Mild cardiomegaly with increased pulmonary interstitial opacity and blunting of the right costophrenic angle from earlier this month. Favor acute interstitial edema with small right pleural effusion. Viral/atypical respiratory infection felt less likely. Electronically Signed   By: Genevie Ann M.D.   On: 11/25/2020 06:52   ECHOCARDIOGRAM COMPLETE  Result Date: 12/16/2020    ECHOCARDIOGRAM REPORT   Patient Name:   ANILA BOJARSKI Date of Exam: 12/16/2020 Medical Rec #:  [id number]        Height:       62.0 in Accession #:    [id number]       Weight:       174.2 lb Date of Birth:  1945-05-04        BSA:          1.803 m Patient Age:    83 years         BP:           153/85 mmHg Patient Gender: F  HR:           72 bpm. Exam Location:  [facility] Procedure: 2D Echo, Cardiac Doppler and Color Doppler Indications:    CHF-Acute Systolic  History:        Patient has prior history of Echocardiogram examinations, most                 recent 10/15/2020. CHF, Acute MI and CAD, Signs/Symptoms:Chest                 Pain; Risk Factors:Hypertension and Dyslipidemia.  Sonographer:    Wenda Low Referring Phys: [id number] OLADAPO ADEFESO  Sonographer Comments: TDS- Patient is in respiratory distress and is unable to breathe when placed in position for the echo. Images acquired with patient sitting. IMPRESSIONS  1. Left ventricular ejection  fraction, by estimation, is 35 to 40%. The left ventricle has moderately decreased function. The left ventricle demonstrates global hypokinesis. There is mild left ventricular hypertrophy. Left ventricular diastolic parameters are consistent with Grade II diastolic dysfunction (pseudonormalization).  2. Right ventricular systolic function is normal. The right ventricular size is mildly enlarged. There is severely elevated pulmonary artery systolic pressure. The estimated right ventricular systolic pressure is 07.3 mmHg.  3. Left atrial size was moderately dilated.  4. The mitral valve is normal in structure. Mild mitral valve regurgitation. Mild mitral stenosis. The mean mitral valve gradient is 5.0 mmHg. Moderate mitral annular calcification.  5. The aortic valve is tricuspid. Aortic valve regurgitation is not visualized. No aortic stenosis is present.  6. The inferior vena cava is normal in size with <50% respiratory variability, suggesting right atrial pressure of 8 mmHg. Comparison(s): No significant change from prior study. Prior images reviewed side by side. FINDINGS  Left Ventricle: Left ventricular ejection fraction, by estimation, is 35 to 40%. The left ventricle has moderately decreased function. The left ventricle demonstrates global hypokinesis. The left ventricular internal cavity size was normal in size. There is mild left ventricular hypertrophy. Left ventricular diastolic parameters are consistent with Grade II diastolic dysfunction (pseudonormalization).  LV Wall Scoring: The entire apex is akinetic. Right Ventricle: The right ventricular size is mildly enlarged. No increase in right ventricular wall thickness. Right ventricular systolic function is normal. There is severely elevated pulmonary artery systolic pressure. The tricuspid regurgitant velocity is 3.71 m/s, and with an assumed right atrial pressure of 8 mmHg, the estimated right ventricular systolic pressure is 71.0 mmHg. Left Atrium: Left  atrial size was moderately dilated. Right Atrium: Right atrial size was normal in size. Pericardium: There is no evidence of pericardial effusion. Mitral Valve: The mitral valve is normal in structure. There is moderate thickening of the mitral valve leaflet(s). There is moderate calcification of the mitral valve leaflet(s). Moderate mitral annular calcification. Mild mitral valve regurgitation. Mild mitral valve stenosis. MV peak gradient, 13.0 mmHg. The mean mitral valve gradient is 5.0 mmHg. Tricuspid Valve: The tricuspid valve is normal in structure. Tricuspid valve regurgitation is trivial. No evidence of tricuspid stenosis. Aortic Valve: The aortic valve is tricuspid. Aortic valve regurgitation is not visualized. No aortic stenosis is present. Aortic valve mean gradient measures 7.0 mmHg. Aortic valve peak gradient measures 13.4 mmHg. Aortic valve area, by VTI measures 1.85  cm. Pulmonic Valve: The pulmonic valve was normal in structure. Pulmonic valve regurgitation is not visualized. No evidence of pulmonic stenosis. Aorta: The aortic root is normal in size and structure. Venous: The inferior vena cava is normal in size with less than 50% respiratory variability,  suggesting right atrial pressure of 8 mmHg. IAS/Shunts: No atrial level shunt detected by color flow Doppler.  LEFT VENTRICLE PLAX 2D LVIDd:         5.30 cm      Diastology LVIDs:         4.70 cm      LV e' medial:    7.83 cm/s LV PW:         1.30 cm      LV E/e' medial:  22.2 LV IVS:        1.20 cm      LV e' lateral:   9.90 cm/s LVOT diam:     1.90 cm      LV E/e' lateral: 17.6 LV SV:         66 LV SV Index:   37 LVOT Area:     2.84 cm  LV Volumes (MOD) LV vol d, MOD A2C: 118.0 ml LV vol d, MOD A4C: 105.0 ml LV vol s, MOD A2C: 67.8 ml LV vol s, MOD A4C: 70.8 ml LV SV MOD A2C:     50.2 ml LV SV MOD A4C:     105.0 ml LV SV MOD BP:      43.4 ml RIGHT VENTRICLE RV Basal diam:  4.10 cm RV Mid diam:    4.30 cm RV S prime:     14.60 cm/s TAPSE (M-mode):  3.0 cm LEFT ATRIUM             Index        RIGHT ATRIUM           Index LA diam:        4.40 cm 2.44 cm/m   RA Area:     12.80 cm LA Vol (A2C):   74.5 ml 41.33 ml/m  RA Volume:   27.30 ml  15.15 ml/m LA Vol (A4C):   52.3 ml 29.01 ml/m LA Biplane Vol: 64.2 ml 35.62 ml/m  AORTIC VALVE                     PULMONIC VALVE AV Area (Vmax):    1.72 cm      PV Vmax:       1.19 m/s AV Area (Vmean):   1.72 cm      PV Peak grad:  5.7 mmHg AV Area (VTI):     1.85 cm AV Vmax:           183.00 cm/s AV Vmean:          120.000 cm/s AV VTI:            0.357 m AV Peak Grad:      13.4 mmHg AV Mean Grad:      7.0 mmHg LVOT Vmax:         111.00 cm/s LVOT Vmean:        72.900 cm/s LVOT VTI:          0.233 m LVOT/AV VTI ratio: 0.65  AORTA Ao Root diam: 2.60 cm MITRAL VALVE                TRICUSPID VALVE MV Area (PHT): 4.17 cm     TR Peak grad:   55.1 mmHg MV Area VTI:   1.82 cm     TR Vmax:        371.00 cm/s MV Peak grad:  13.0 mmHg MV Mean grad:  5.0 mmHg     SHUNTS MV Vmax:  1.80 m/s     Systemic VTI:  0.23 m MV Vmean:      101.0 cm/s   Systemic Diam: 1.90 cm MV Decel Time: 182 msec MV E velocity: 174.00 cm/s Candee Furbish MD Electronically signed by Candee Furbish MD Signature Date/Time: 12/16/2020/5:28:59 PM    Final     Microbiology Recent Results (from the past 240 hour(s))  Resp Panel by RT-PCR (Flu A&B, Covid) Nasopharyngeal Swab     Status: None   Collection Time: 12/23/2020  6:25 PM   Specimen: Nasopharyngeal Swab; Nasopharyngeal(NP) swabs in vial transport medium  Result Value Ref Range Status   SARS Coronavirus 2 by RT PCR NEGATIVE NEGATIVE Final    Comment: (NOTE) SARS-CoV-2 target nucleic acids are NOT DETECTED.  The SARS-CoV-2 RNA is generally detectable in upper respiratory specimens during the acute phase of infection. The lowest concentration of SARS-CoV-2 viral copies this assay can detect is 138 copies/mL. A negative result does not preclude SARS-Cov-2 infection and should not be used as the  sole basis for treatment or other patient management decisions. A negative result may occur with  improper specimen collection/handling, submission of specimen other than nasopharyngeal swab, presence of viral mutation(s) within the areas targeted by this assay, and inadequate number of viral copies(<138 copies/mL). A negative result must be combined with clinical observations, patient history, and epidemiological information. The expected result is Negative.  Fact Sheet for Patients:  EntrepreneurPulse.com.au  Fact Sheet for Healthcare Providers:  IncredibleEmployment.be  This test is no t yet approved or cleared by the [place] FDA and  has been authorized for detection and/or diagnosis of SARS-CoV-2 by FDA under an Emergency Use Authorization (EUA). This EUA will remain  in effect (meaning this test can be used) for the duration of the COVID-19 declaration under Section 564(b)(1) of the Act, 21 U.S.C.section 360bbb-3(b)(1), unless the authorization is terminated  or revoked sooner.       Influenza A by PCR NEGATIVE NEGATIVE Final   Influenza B by PCR NEGATIVE NEGATIVE Final    Comment: (NOTE) The Xpert Xpress SARS-CoV-2/FLU/RSV plus assay is intended as an aid in the diagnosis of influenza from Nasopharyngeal swab specimens and should not be used as a sole basis for treatment. Nasal washings and aspirates are unacceptable for Xpert Xpress SARS-CoV-2/FLU/RSV testing.  Fact Sheet for Patients: EntrepreneurPulse.com.au  Fact Sheet for Healthcare Providers: IncredibleEmployment.be  This test is not yet approved or cleared by the [place] FDA and has been authorized for detection and/or diagnosis of SARS-CoV-2 by FDA under an Emergency Use Authorization (EUA). This EUA will remain in effect (meaning this test can be used) for the duration of the COVID-19 declaration under Section 564(b)(1) of the  Act, 21 U.S.C. section 360bbb-3(b)(1), unless the authorization is terminated or revoked.  Performed at [facility], [address] [place],  [address]   MRSA Next Gen by PCR, Nasal     Status: None   Collection Time: 12/16/20  4:05 PM   Specimen: Nasal Mucosa; Nasal Swab  Result Value Ref Range Status   MRSA by PCR Next Gen NOT DETECTED NOT DETECTED Final    Comment: (NOTE) The GeneXpert MRSA Assay (FDA approved for NASAL specimens only), is one component of a comprehensive MRSA colonization surveillance program. It is not intended to diagnose MRSA infection nor to guide or monitor treatment for MRSA infections. Test performance is not FDA approved in patients less than 34 years old. Performed at [facility], [address], [place],  [place] [address]     Lab Basic Metabolic Panel: Recent Labs  Lab 12/30/2020 1739 12/16/20 0354 12/17/20 0445  NA 139 141 142  K 3.6 3.6 3.6  CL 108 111 109  CO2 23 23 23   GLUCOSE 157* 118* 145*  BUN 45* 45* 49*  CREATININE 2.52* 2.63* 2.70*  CALCIUM 8.1* 8.1* 8.3*  MG  --  1.8 1.8  PHOS  --  5.5*  --    Liver Function Tests: Recent Labs  Lab 12/24/2020 1739 12/16/20 0354  AST 23 44*  ALT 18 17  ALKPHOS 73 62  BILITOT 0.6 0.4  PROT 6.3* 5.4*  ALBUMIN 3.1* 2.7*   No results for input(s): LIPASE, AMYLASE in the last 168 hours. No results for input(s): AMMONIA in the last 168 hours. CBC: Recent Labs  Lab 12/14/2020 1739 12/16/20 0354 12/17/20 0445  WBC 11.9* 9.4 16.1*  NEUTROABS 10.5*  --   --   HGB 9.4* 8.5* 10.0*  HCT 29.8* 27.5* 31.6*  MCV 93.4 95.5 94.9  PLT 222 218 237   Cardiac Enzymes: No results for input(s): CKTOTAL, CKMB, CKMBINDEX, TROPONINI in the last 168 hours. Sepsis Labs: Recent Labs  Lab 12/30/2020 1739 12/16/20 0354 12/17/20 0445  PROCALCITON  --  <0.10  --   WBC 11.9* 9.4 16.1*    Procedures/Operations   Keelyn Monjaras MD 2020-12-31, 8:11 AM How to contact the [facility] Attending or Consulting  provider [place] or covering provider during after hours [place], for this patient?  Check the care team in [facility] and look for a) attending/consulting [facility] provider listed and b) the [facility] team listed Log into [email or web address] and use [place]'s universal password to access. If you do not have the password, please contact the hospital operator. Locate the [facility] provider you are looking for under [facility] and page to a number that you can be directly reached. If you still have difficulty reaching the provider, please page the [facility] (Director on Call) for the Hospitalists listed on amion for assistance.

## 2021-01-06 NOTE — Progress Notes (Signed)
Patient noted to be asystole on telemetry. Writer and KL RN in to assess patient and noted no respirations or heart sounds. Time of Death 11. Dr Wynetta Emery made aware. Daughter at bedside with patient, emotional support provided.

## 2021-01-06 NOTE — Progress Notes (Addendum)
Daughter called back. Updated her and she plans to come on in.    Attempted to call Daughter twice and brother once to let them know that pt oxygen level is consistently running lower than she has throughout the night if they wanted to return. No answer either attempt.

## 2021-01-06 NOTE — Progress Notes (Signed)
Family left, took all patient belongings including home blanket. All questions answered and family expressed full understanding of next steps. Post mortem care provided. IV's removed.

## 2021-01-06 DEATH — deceased

## 2022-07-06 ENCOUNTER — Other Ambulatory Visit: Payer: Self-pay | Admitting: Internal Medicine

## 2023-04-14 IMAGING — DX DG CHEST 1V PORT
1 series · 1 of 1 positions shown · non-contrast
Comparison: 10/15/2020 [DATE] a.m.

CLINICAL DATA: Intermittent chest pain and shortness of breath

EXAM:
PORTABLE CHEST 1 VIEW

[chest ap]
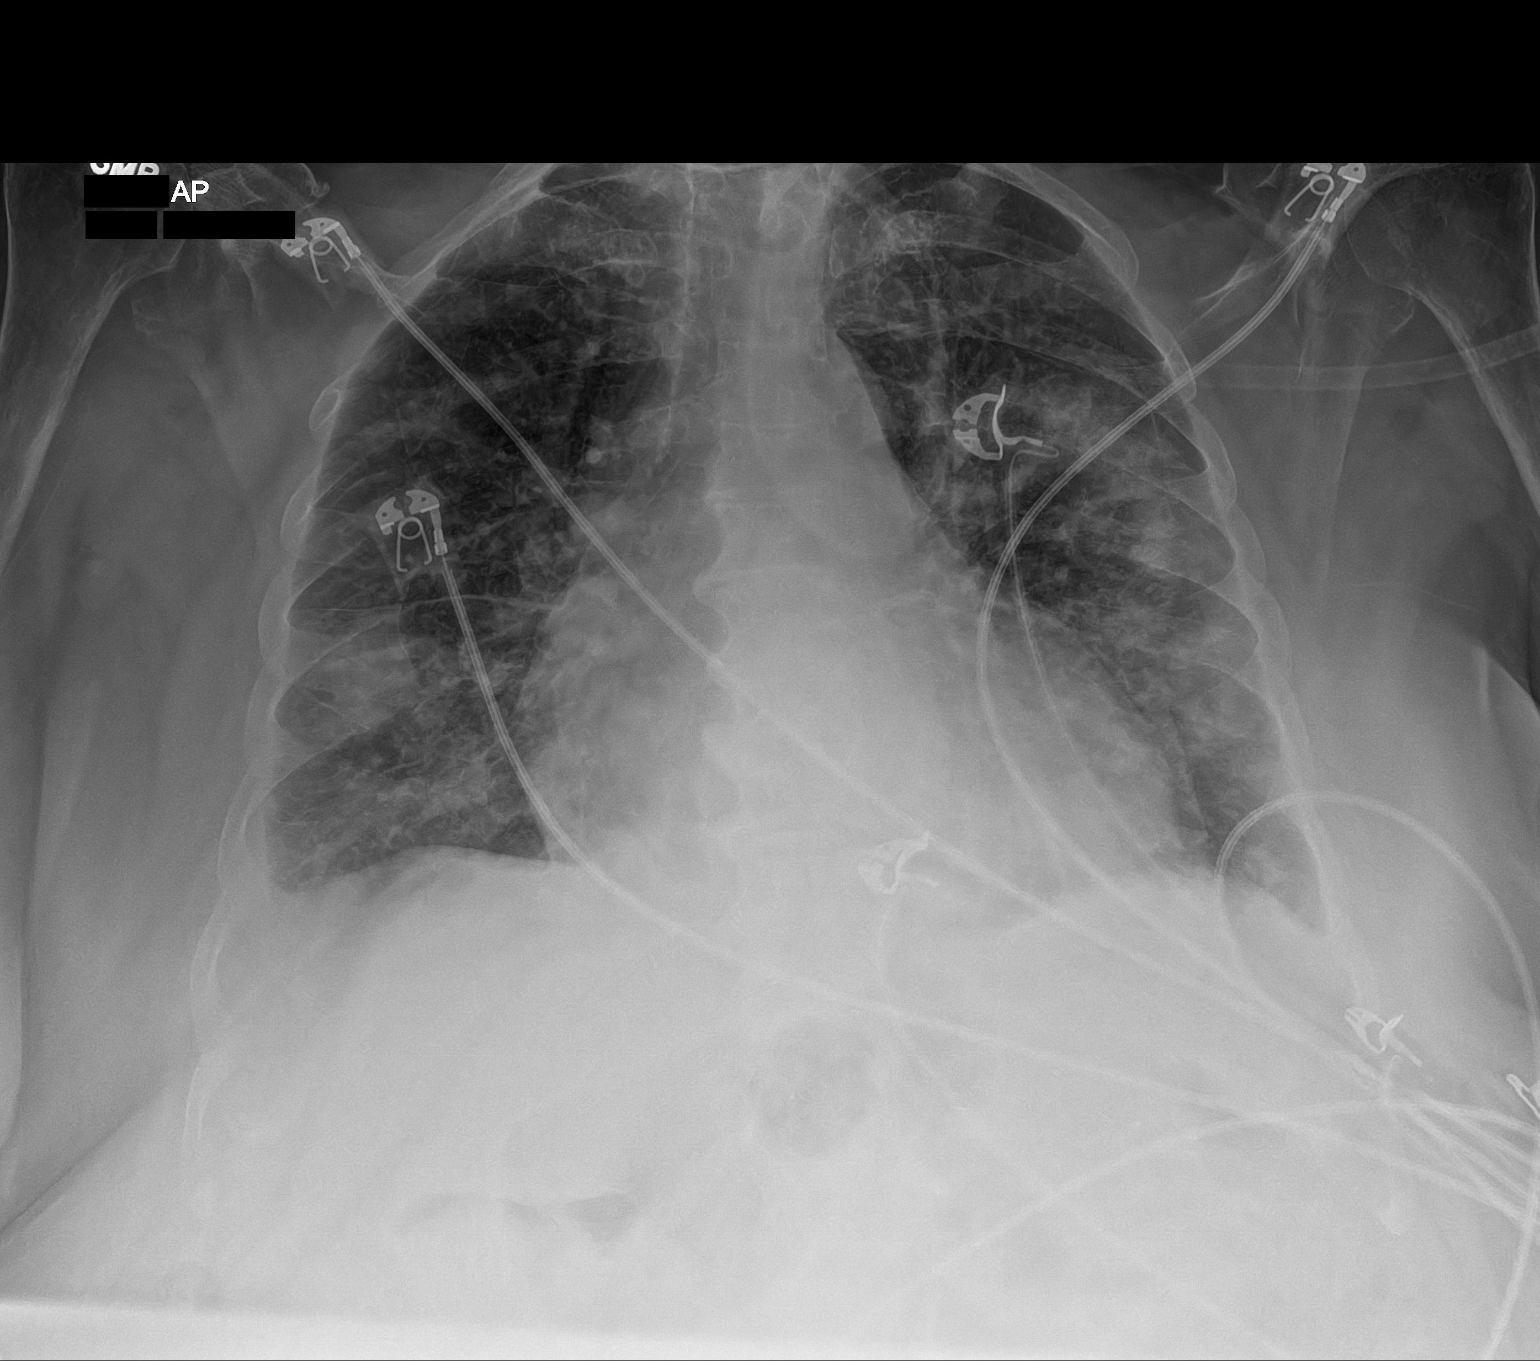

[1 of 1 positions shown; findings below may reference images not displayed]

FINDINGS: Unchanged enlarged cardiac silhouette. Redemonstrated vascular
congestion and patchy airspace opacities. Trace pleural effusions.
No pneumothorax. No acute osseous abnormality.
IMPRESSION: Overall unchanged patchy parenchymal opacities, concerning for
edema.
